# Patient Record
Sex: Male | Born: 1945
Health system: Southern US, Community
[De-identification: ages and names within clinical notes are randomized; demographics above are authoritative.]

## PROBLEM LIST (undated history)

## (undated) DIAGNOSIS — Z21 Asymptomatic human immunodeficiency virus [HIV] infection status: Secondary | ICD-10-CM

## (undated) DIAGNOSIS — E049 Nontoxic goiter, unspecified: Secondary | ICD-10-CM

## (undated) DIAGNOSIS — I639 Cerebral infarction, unspecified: Secondary | ICD-10-CM

## (undated) DIAGNOSIS — D849 Immunodeficiency, unspecified: Secondary | ICD-10-CM

## (undated) DIAGNOSIS — I10 Essential (primary) hypertension: Secondary | ICD-10-CM

## (undated) DIAGNOSIS — R011 Cardiac murmur, unspecified: Secondary | ICD-10-CM

## (undated) DIAGNOSIS — B20 Human immunodeficiency virus [HIV] disease: Secondary | ICD-10-CM

## (undated) DIAGNOSIS — I77810 Thoracic aortic ectasia: Secondary | ICD-10-CM

## (undated) HISTORY — DX: Cerebral infarction, unspecified: I63.9

## (undated) HISTORY — DX: Cardiac murmur, unspecified: R01.1

## (undated) HISTORY — PX: NO PAST SURGERIES: SHX2092

---

## 1998-02-01 ENCOUNTER — Encounter: Admission: RE | Admit: 1998-02-01 | Discharge: 1998-05-02 | Payer: Self-pay | Admitting: Internal Medicine

## 1999-04-21 ENCOUNTER — Emergency Department (HOSPITAL_COMMUNITY): Admission: EM | Admit: 1999-04-21 | Discharge: 1999-04-21 | Payer: Self-pay

## 2007-04-18 ENCOUNTER — Emergency Department (HOSPITAL_COMMUNITY): Admission: EM | Admit: 2007-04-18 | Discharge: 2007-04-18 | Payer: Self-pay | Admitting: Emergency Medicine

## 2007-04-25 ENCOUNTER — Emergency Department (HOSPITAL_COMMUNITY): Admission: EM | Admit: 2007-04-25 | Discharge: 2007-04-25 | Payer: Self-pay | Admitting: Emergency Medicine

## 2014-10-15 ENCOUNTER — Encounter (HOSPITAL_COMMUNITY): Payer: Self-pay

## 2014-10-15 ENCOUNTER — Inpatient Hospital Stay (HOSPITAL_COMMUNITY)
Admission: EM | Admit: 2014-10-15 | Discharge: 2014-10-16 | DRG: 305 | Disposition: A | Discharge status: Home / Self Care | Payer: Medicare Other | Attending: Internal Medicine | Admitting: Internal Medicine

## 2014-10-15 DIAGNOSIS — I10 Essential (primary) hypertension: Secondary | ICD-10-CM | POA: Diagnosis not present

## 2014-10-15 DIAGNOSIS — D61818 Other pancytopenia: Secondary | ICD-10-CM | POA: Diagnosis present

## 2014-10-15 DIAGNOSIS — R748 Abnormal levels of other serum enzymes: Secondary | ICD-10-CM | POA: Diagnosis present

## 2014-10-15 DIAGNOSIS — R040 Epistaxis: Secondary | ICD-10-CM | POA: Diagnosis not present

## 2014-10-15 DIAGNOSIS — I160 Hypertensive urgency: Secondary | ICD-10-CM

## 2014-10-15 LAB — CBC WITH DIFFERENTIAL/PLATELET
BASOS ABS: 0 10*3/uL (ref 0.0–0.1)
Basophils Relative: 0 % (ref 0–1)
Eosinophils Absolute: 0 10*3/uL (ref 0.0–0.7)
Eosinophils Relative: 0 % (ref 0–5)
HCT: 38.7 % — ABNORMAL LOW (ref 39.0–52.0)
HEMOGLOBIN: 12.9 g/dL — AB (ref 13.0–17.0)
LYMPHS PCT: 29 % (ref 12–46)
Lymphs Abs: 0.9 10*3/uL (ref 0.7–4.0)
MCH: 27.8 pg (ref 26.0–34.0)
MCHC: 33.3 g/dL (ref 30.0–36.0)
MCV: 83.4 fL (ref 78.0–100.0)
MONOS PCT: 7 % (ref 3–12)
Monocytes Absolute: 0.2 10*3/uL (ref 0.1–1.0)
NEUTROS ABS: 2 10*3/uL (ref 1.7–7.7)
NEUTROS PCT: 64 % (ref 43–77)
PLATELETS: 117 10*3/uL — AB (ref 150–400)
RBC: 4.64 MIL/uL (ref 4.22–5.81)
RDW: 14 % (ref 11.5–15.5)
WBC: 3.1 10*3/uL — ABNORMAL LOW (ref 4.0–10.5)

## 2014-10-15 LAB — COMPREHENSIVE METABOLIC PANEL
ALK PHOS: 125 U/L — AB (ref 39–117)
ALT: 12 U/L (ref 0–53)
ANION GAP: 8 (ref 5–15)
AST: 20 U/L (ref 0–37)
Albumin: 3.3 g/dL — ABNORMAL LOW (ref 3.5–5.2)
BUN: 17 mg/dL (ref 6–23)
CO2: 27 mmol/L (ref 19–32)
Calcium: 8.7 mg/dL (ref 8.4–10.5)
Chloride: 103 mmol/L (ref 96–112)
Creatinine, Ser: 0.92 mg/dL (ref 0.50–1.35)
GFR calc Af Amer: >90 mL/min (ref >90)
GFR calc non Af Amer: 84 mL/min — ABNORMAL LOW (ref >90)
Glucose, Bld: 113 mg/dL — ABNORMAL HIGH (ref 70–99)
Potassium: 3.8 mmol/L (ref 3.5–5.1)
Sodium: 138 mmol/L (ref 135–145)
Total Bilirubin: 0.7 mg/dL (ref 0.3–1.2)
Total Protein: 7.9 g/dL (ref 6.0–8.3)

## 2014-10-15 LAB — TROPONIN I: Troponin I: <0.03 ng/mL (ref <0.031)

## 2014-10-15 MED ORDER — LABETALOL HCL 5 MG/ML IV SOLN: 5.0000 mg | INTRAVENOUS | Status: DC | PRN

## 2014-10-15 MED ORDER — AMLODIPINE BESYLATE 5 MG PO TABS
5.0000 mg | ORAL_TABLET | Freq: Once | ORAL | Status: AC
  Administered 2014-10-15: 5 mg via ORAL
  Filled 2014-10-15: qty 1

## 2014-10-15 MED ORDER — METOPROLOL TARTRATE 1 MG/ML IV SOLN
5.0000 mg | INTRAVENOUS | Status: DC | PRN
  Administered 2014-10-15 (×2): 5 mg via INTRAVENOUS
  Filled 2014-10-15 (×2): qty 5

## 2014-10-15 MED ORDER — HYDRALAZINE HCL 20 MG/ML IJ SOLN
5.0000 mg | INTRAMUSCULAR | Status: DC | PRN
  Administered 2014-10-16: 5 mg via INTRAVENOUS
  Filled 2014-10-15: qty 1

## 2014-10-15 MED ORDER — SILVER NITRATE-POT NITRATE 75-25 % EX MISC
1.0000 | Freq: Once | CUTANEOUS | Status: AC
  Administered 2014-10-15: 1 via TOPICAL
  Filled 2014-10-15: qty 1

## 2014-10-15 MED ORDER — NICARDIPINE HCL IN NACL 20-0.86 MG/200ML-% IV SOLN
3.0000 mg/h | INTRAVENOUS | Status: DC
  Administered 2014-10-15: 5 mg/h via INTRAVENOUS
  Filled 2014-10-15: qty 200

## 2014-10-15 MED ORDER — LABETALOL HCL 5 MG/ML IV SOLN
10.0000 mg | Freq: Once | INTRAVENOUS | Status: AC
  Administered 2014-10-15: 10 mg via INTRAVENOUS
  Filled 2014-10-15: qty 4

## 2014-10-15 MED ORDER — ACETAMINOPHEN 325 MG PO TABS
650.0000 mg | ORAL_TABLET | Freq: Once | ORAL | Status: AC
  Administered 2014-10-15: 650 mg via ORAL
  Filled 2014-10-15: qty 2

## 2014-10-15 MED ORDER — NITROGLYCERIN IN D5W 200-5 MCG/ML-% IV SOLN
0.0000 ug/min | INTRAVENOUS | Status: DC
  Administered 2014-10-15: 5 ug/min via INTRAVENOUS
  Filled 2014-10-15: qty 250

## 2014-10-15 MED ORDER — LISINOPRIL 5 MG PO TABS
5.0000 mg | ORAL_TABLET | Freq: Every day | ORAL | Status: DC
  Administered 2014-10-16 (×2): 5 mg via ORAL
  Filled 2014-10-15 (×2): qty 1

## 2014-10-15 MED ORDER — AMLODIPINE BESYLATE 5 MG PO TABS: 5.0000 mg | ORAL_TABLET | Freq: Every day | ORAL | Status: DC

## 2014-10-15 NOTE — ED Notes (Signed)
Dr. Rubin PayorPickering aware of pt blood pressure.

## 2014-10-15 NOTE — ED Notes (Signed)
Dr. Rubin PayorPickering placed rapid rhino in patients right nostril.

## 2014-10-15 NOTE — ED Notes (Signed)
Per [facility]: pt. Is from home. Sudden onset of epistaxis this AM. Approx. 1.5 hours at this time. Continuously bleeding, pt. Has spit up some blood clots. Pt. Is hypertensive at 220/120. Pt. Denies pain at this time.

## 2014-10-15 NOTE — ED Provider Notes (Signed)
CSN: [id number]     Arrival date & time 10/15/14  9528 History   First MD Initiated Contact with Patient 10/15/14 0845     Chief Complaint  Patient presents with  . Epistaxis     (Consider location/radiation/quality/duration/timing/severity/associated sxs/prior Treatment) Patient is a 69 y.o. male presenting with nosebleeds. The history is provided by the patient.  Epistaxis Location:  Bilateral Associated symptoms: no fever    patient with blood going out both of his nostrils that began earlier today. Also some going down the back was thorough. No other bleeding. No chest pain. No fevers. No trauma. No history of same. He does not see a doctor. He denies alcohol intake.  History reviewed. No pertinent past medical history. History reviewed. No pertinent past surgical history. No family history on file. History  Substance Use Topics  . Smoking status: Never Smoker   . Smokeless tobacco: Not on file  . Alcohol Use: No    Review of Systems  Constitutional: Negative for fever, chills and diaphoresis.  HENT: Positive for nosebleeds.   Respiratory: Negative for shortness of breath.   Cardiovascular: Negative for chest pain.  Gastrointestinal: Negative for abdominal pain.  Musculoskeletal: Negative for joint swelling.  Skin: Negative for wound.  Hematological: Does not bruise/bleed easily.      Allergies  Review of patient's allergies indicates no known allergies.  Home Medications   Prior to Admission medications   Medication Sig Start Date End Date Taking? Authorizing Provider  amLODipine (NORVASC) 5 MG tablet Take 1 tablet (5 mg total) by mouth daily. 10/15/14   Juliet Rude. Cilicia Borden, MD   BP 183/104 mmHg  Pulse 75  Temp(Src) 98.2 F (36.8 C) (Oral)  Resp 18  SpO2 100% Physical Exam  Constitutional: He appears well-developed.  HENT:  Head: Normocephalic.  Some bleeding out of both nares. Slight bleeding in posterior pharynx.  Cardiovascular: Normal rate and  regular rhythm.   Pulmonary/Chest: Effort normal.  Abdominal: Soft.  Neurological: He is alert.  Skin: Skin is warm.    ED Course  EPISTAXIS MANAGEMENT Date/Time: 10/15/2014 9:45 AM Performed by: Benjiman Core R. Authorized by: Billee Cashing Consent: Verbal consent obtained. Written consent not obtained. Risks and benefits: risks, benefits and alternatives were discussed Consent given by: patient Patient understanding: patient states understanding of the procedure being performed Required items: required blood products, implants, devices, and special equipment available Patient identity confirmed: verbally with patient Time out: Immediately prior to procedure a "time out" was called to verify the correct patient, procedure, equipment, support staff and site/side marked as required. Local anesthetic: topical anesthetic (Phenylephrine and 4% lidocaine mixed and on a cotton ball in each naris) Patient sedated: no Treatment site: left posterior Repair method: nasal balloon Post-procedure assessment: bleeding stopped Treatment complexity: simple Recurrence: recurrence of recent bleed Patient tolerance: Patient tolerated the procedure well with no immediate complications Comments: Patient initially had possible bleeding site visualized in right nerve anteriorly. Silver nitrate applied. And begin to have recurrence of bleeding out of both nostrils but appeared be worse on the left. 7.5 cm rapid Rhino a placed with control of the bleeding.  Patient had recurrence of bleeding. 5.5 cm balloon placed in the right nerve. He then had later continued bleeding out of the left nerve. 7.5 cm balloon was removed and a 9 cm anterior posterior balloon was placed with improvement of the bleeding.   (including critical care time) Labs Review Labs Reviewed  CBC WITH DIFFERENTIAL/PLATELET - Abnormal; Notable for the  following:    WBC 3.1 (*)    Hemoglobin 12.9 (*)    HCT 38.7 (*)    Platelets  117 (*)    All other components within normal limits  COMPREHENSIVE METABOLIC PANEL - Abnormal; Notable for the following:    Glucose, Bld 113 (*)    Albumin 3.3 (*)    Alkaline Phosphatase 125 (*)    GFR calc non Af Amer 84 (*)    All other components within normal limits  TROPONIN I    Imaging Review No results found.   EKG Interpretation   Date/Time:  Saturday October 15 2014 13:22:19 EST Ventricular Rate:  70 PR Interval:  152 QRS Duration: 97 QT Interval:  502 QTC Calculation: 542 R Axis:   -74 Text Interpretation:  Sinus rhythm Probable left atrial enlargement Left  anterior fascicular block Left ventricular hypertrophy Anterior ST  elevation, probably due to LVH Prolonged QT interval Confirmed by  Rubin PayorPICKERING  MD, Harrold DonathNATHAN [phone]) on 10/15/2014 1:29:49 PM      MDM   Final diagnoses:  Left-sided epistaxis  Essential hypertension   Patient with nosebleed. His been somewhat difficult to control required packing 3 different times. Did eventually get apparent hemostasis. Hypertension without history. He has not however had much medical examination in the past. He is given some medicine to lower down the blood pressure. With the difficulty in the bleeding and the persistent hypertension will admit to internal medicine. Teaching service will see the patient.       Juliet RudeNathan R. Rubin PayorPickering, MD 10/15/14 [phone]

## 2014-10-15 NOTE — ED Notes (Signed)
Per Critical Care, pt BP too low. Told to turn off Cardene.

## 2014-10-15 NOTE — ED Notes (Signed)
While patient was getting ready, nose started bleeding again. Pt given gauze to control bleeding, MD notified. Pt placed back in gown.

## 2014-10-15 NOTE — ED Notes (Signed)
Spoke with internal medicine, will see patient shortly.

## 2014-10-15 NOTE — H&P (Signed)
Date: 10/15/2014               Patient Name:  Thomas NordmannWilliam Haye MRN: [id number]  DOB: 07/08/1946 Age / Sex: 69 y.o., male   PCP: No primary care provider on file.         Medical Service: Internal Medicine Teaching Service         Attending Physician: Dr. Burns SpainElizabeth A Butcher, MD    First Contact: Dr. Farley LyAdam Rothman  Pager: [phone]   Second Contact: Dr. Evelena PeatAlex Wilson  Pager: [phone]        After Hours (After 5p/  First Contact Pager: [phone]  weekends / holidays): Second Contact Pager: [phone]   Chief Complaint: Epistaxis  History of Present Illness: [name] is a 69 year old male with no known past medical history who presents today to the ED with epistaxis.  This morning, he reports sitting in his truck when he first noted the onset of epistaxis. He reports the bleeding was substantial and compares it to water running out of a faucet. Afterwards, he went to the bathroom to clean himself up at the place of his employment when one of his coworkers called 911. He was then brought to the emergency department for further evaluation was found to have elevated blood pressures, highest of which was 207/111. He denies any recent trauma, NSAID use, changes in his diet, family history of bleeding disorders, prior episodes of epistaxis, or other associated symptoms like headache, chest pain, shortness of breath, changes in vision, hematochezia, melena, hematuria. He has not seen a doctor in several years though when he last saw one regularly, he was told he had high blood pressure but never required any medication. Only prior hospital encounter he reports his when he had to get stitches for his left second finger after he sustained an injury as a consequence of operating machinery at his place of employment, [employer]. 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 He denies any history or recent use of tobacco, alcohol, illicit  drugs.  In the ED, he was given Norvasc 5 mg as well as labetalol 10 mg IV.  Meds: No current facility-administered medications for this encounter.   Current Outpatient Prescriptions  Medication Sig Dispense Refill  . amLODipine (NORVASC) 5 MG tablet Take 1 tablet (5 mg total) by mouth daily. 30 tablet 0    Allergies: Allergies as of 10/15/2014  . (No Known Allergies)   History reviewed. No pertinent past medical history. History reviewed. No pertinent past surgical history. No family history on file. History   Social History  . Marital Status: Single    Spouse Name: N/A  . Number of Children: N/A  . Years of Education: N/A   Occupational History  . Not on file.   Social History Main Topics  . Smoking status: Never Smoker   . Smokeless tobacco: Not on file  . Alcohol Use: No  . Drug Use: No  . Sexual Activity: Not on file   Other Topics Concern  . Not on file   Social History Narrative  . No narrative on file    Review of Systems: Review of Systems  Constitutional: Negative for fever.  HENT: Positive for nosebleeds.   Eyes: Negative for blurred vision.  Respiratory: Negative for cough, hemoptysis and shortness of breath.   Cardiovascular: Negative for chest pain.  Gastrointestinal: Negative for nausea, vomiting, abdominal pain, diarrhea and melena.  Genitourinary: Negative  for hematuria.  Neurological: Negative for dizziness and headaches.  Endo/Heme/Allergies: Does not bruise/bleed easily.  Psychiatric/Behavioral: Negative for substance abuse.     Physical Exam: Blood pressure 179/97, pulse 78, temperature 98.2 F (36.8 C), temperature source Oral, resp. rate 20, SpO2 100 %. General: resting in bed, NAD, thin appearing HEENT: PERRL, EOMI, no scleral icterus, oropharynx clear, nasal packing present in both nares Cardiac: RRR, systolic ejection murmur best appreciated in the the right upper sternal border Pulm: clear to auscultation bilaterally, no  wheezes, rales, or rhonchi Abd: soft, nontender, nondistended, BS present, no CVA tenderness Ext: No pretibial edema Neuro: CN II-XII intact, 5/5 upper and lower extremity strength, 2+ grip strength   Lab results: Basic Metabolic Panel:  Recent Labs  96/04/54 1324  NA 138  K 3.8  CL 103  CO2 27  GLUCOSE 113*  BUN 17  CREATININE 0.92  CALCIUM 8.7   Liver Function Tests:  Recent Labs  10/15/14 1324  AST 20  ALT 12  ALKPHOS 125*  BILITOT 0.7  PROT 7.9  ALBUMIN 3.3*   CBC:  Recent Labs  10/15/14 1324  WBC 3.1*  NEUTROABS 2.0  HGB 12.9*  HCT 38.7*  MCV 83.4  PLT 117*   Cardiac Enzymes:  Recent Labs  10/15/14 1324  TROPONINI <0.03    Other results: EKG: Reviewed and compared with none prior. Normal sinus rhythm Left axis deviation  Assessment & Plan by Problem:  Hypertensive urgency: Absence of neurologic deficits, right upper quadrant abdominal pain, chest pain, shortness of breath is otherwise reassuring. Unsure of what his prior baseline is as he has no known past medical history other than what he has reported on interview. -Give additional amlodipine   -Start lisinopril  and start nicardipine gtt her goal systolic blood pressure 160-170. -Consider HCTZ 25 mg and up titrate tomorrow -Continue Lopressor 5 mg IV & hydralazine  IV when necessary for BP greater than 180/110 -Start Zofran 4 mg every 6 hours as needed for nausea -Check CMET tomorrow morning  Epistaxis: Likely acute, possibly in the setting of cold weather. No prior history of a bleeding disorder or family history is otherwise reassuring. Platelets on admission were found to be 117, which is mildly lower than the reference range for normal. Stable at the time of interview. -Continue monitoring for active bleeding -Check CBC tomorrow morning  Elevated alkaline phosphatase: 125 on admission. Physiologic causes include postprandial state, age greater than 42, male sex.  Hepatobiliary disease less likely given that other LFTs are otherwise unremarkable though GGT would be the next step in working this up as an elevated value would be expected. If negative, bone disorders would be suspect.  -CMET as noted above  #FEN:  -Diet: Heart Healthy  #DVT prophylaxis: heparin 5000 units subcutaneous  #CODE STATUS: FULL CODE -Confirmed with patient on admission  Dispo: Disposition is deferred at this time, awaiting improvement of current medical problems.   The patient does not have a current PCP (No primary care provider on file.) and does not know need an [facility] hospital follow-up appointment after discharge.  The patient does not know have transportation limitations that hinder transportation to clinic appointments.  Signed: Heywood Iles, MD 10/15/2014, 7:32 PM

## 2014-10-15 NOTE — ED Notes (Signed)
Dr. Burtis Junessadek [phone].

## 2014-10-15 NOTE — ED Notes (Signed)
Pt nose still bleeding. MD notified. See orders.

## 2014-10-15 NOTE — ED Notes (Signed)
Internal medicine contact, will come see patient shortly.

## 2014-10-16 DIAGNOSIS — I10 Essential (primary) hypertension: Principal | ICD-10-CM

## 2014-10-16 LAB — COMPREHENSIVE METABOLIC PANEL
ALBUMIN: 3.3 g/dL — AB (ref 3.5–5.2)
ALK PHOS: 117 U/L (ref 39–117)
ALT: 11 U/L (ref 0–53)
ANION GAP: 4 — AB (ref 5–15)
AST: 19 U/L (ref 0–37)
BUN: 12 mg/dL (ref 6–23)
CHLORIDE: 103 mmol/L (ref 96–112)
CO2: 31 mmol/L (ref 19–32)
Calcium: 8.8 mg/dL (ref 8.4–10.5)
Creatinine, Ser: 0.91 mg/dL (ref 0.50–1.35)
GFR calc Af Amer: >90 mL/min (ref >90)
GFR, EST NON AFRICAN AMERICAN: 84 mL/min — AB (ref >90)
GLUCOSE: 109 mg/dL — AB (ref 70–99)
Potassium: 3.5 mmol/L (ref 3.5–5.1)
SODIUM: 138 mmol/L (ref 135–145)
TOTAL PROTEIN: 7.8 g/dL (ref 6.0–8.3)
Total Bilirubin: 0.8 mg/dL (ref 0.3–1.2)

## 2014-10-16 LAB — CBC
HEMATOCRIT: 38.3 % — AB (ref 39.0–52.0)
Hemoglobin: 12.5 g/dL — ABNORMAL LOW (ref 13.0–17.0)
MCH: 27.2 pg (ref 26.0–34.0)
MCHC: 32.6 g/dL (ref 30.0–36.0)
MCV: 83.3 fL (ref 78.0–100.0)
PLATELETS: 115 10*3/uL — AB (ref 150–400)
RBC: 4.6 MIL/uL (ref 4.22–5.81)
RDW: 13.9 % (ref 11.5–15.5)
WBC: 3.7 10*3/uL — AB (ref 4.0–10.5)

## 2014-10-16 MED ORDER — HEPARIN SODIUM (PORCINE) 5000 UNIT/ML IJ SOLN: 5000.0000 [IU] | Freq: Three times a day (TID) | INTRAMUSCULAR | Status: DC

## 2014-10-16 MED ORDER — ONDANSETRON HCL 4 MG/2ML IJ SOLN: 4.0000 mg | Freq: Four times a day (QID) | INTRAMUSCULAR | Status: DC | PRN

## 2014-10-16 MED ORDER — HYDROCHLOROTHIAZIDE 25 MG PO TABS
25.0000 mg | ORAL_TABLET | Freq: Every day | ORAL | Status: DC
  Administered 2014-10-16: 25 mg via ORAL
  Filled 2014-10-16: qty 1

## 2014-10-16 MED ORDER — LISINOPRIL-HYDROCHLOROTHIAZIDE 10-12.5 MG PO TABS: 1.0000 | ORAL_TABLET | Freq: Every day | ORAL | Status: DC

## 2014-10-16 MED ORDER — ONDANSETRON HCL 4 MG PO TABS: 4.0000 mg | ORAL_TABLET | Freq: Four times a day (QID) | ORAL | Status: DC | PRN

## 2014-10-16 MED ORDER — ACETAMINOPHEN 325 MG PO TABS
650.0000 mg | ORAL_TABLET | Freq: Four times a day (QID) | ORAL | Status: DC | PRN
  Administered 2014-10-16: 650 mg via ORAL
  Filled 2014-10-16: qty 2

## 2014-10-16 NOTE — ED Notes (Signed)
Patient is resting comfortably. 

## 2014-10-16 NOTE — Progress Notes (Signed)
Subjective: Thomas [name]. He has not had any nose bleeds since packed in the ED. He has not noticed blood in his mouth. He says that his headache has improved and denies any vision changes, weakness, numbness, paresethesias. We explained that he needs a PCP to monitor his HTN and follow-up on labs and educated him on the dangers of long-standing untreated HTN. We removed nasal packing b/l with minimal dry blood noted on packing.  Objective: Vital signs in last 24 hours: Filed Vitals:   10/16/14 0402 10/16/14 0435 10/16/14 0535 10/16/14 0730  BP: 179/98 149/83 144/78 168/95  Pulse: 73 64 63 69  Temp: 98.2 F (36.8 C)   98.4 F (36.9 C)  TempSrc: Oral   Oral  Resp: 20 16 18 21   Height: 5\' 9"  (1.753 m)     Weight: 144 lb 9.6 oz (65.59 kg)     SpO2: 96% 99% 99% 97%   Weight change:   Intake/Output Summary (Last 24 hours) at 10/16/14 1018 Last data filed at 10/16/14 0400  Gross per 24 hour  Intake    120 ml  Output      0 ml  Net    120 ml   Gen: A&O x 4, no acute distress, well developed, well nourished HEENT: Atraumatic, PERRL, EOMI, sclerae anicteric, moist mucous membranes Heart: Regular rate and rhythm, normal S1 S2, no murmurs, rubs, or gallops Lungs: Clear to auscultation bilaterally, respirations unlabored Abd: Soft, non-tender, non-distended, + bowel sounds, no hepatosplenomegaly Ext: No edema or cyanosis   Lab Results: Basic Metabolic Panel:  Recent Labs Lab 10/15/14 1324 10/16/14 0345  NA 138 138  K 3.8 3.5  CL 103 103  CO2 27 31  GLUCOSE 113* 109*  BUN 17 12  CREATININE 0.92 0.91  CALCIUM 8.7 8.8   Liver Function Tests:  Recent Labs Lab 10/15/14 1324 10/16/14 0345  AST 20 19  ALT 12 11  ALKPHOS 125* 117  BILITOT 0.7 0.8  PROT 7.9 7.8  ALBUMIN 3.3* 3.3*   CBC:  Recent Labs Lab 10/15/14 1324 10/16/14 0345  WBC 3.1* 3.7*  NEUTROABS 2.0  --   HGB 12.9* 12.5*  HCT 38.7* 38.3*  MCV 83.4 83.3  PLT 117* 115*   Cardiac Enzymes:  Recent  Labs Lab 10/15/14 1324  TROPONINI <0.03   Micro Results: No results found for this or any previous visit (from the past 240 hour(s)). Studies/Results: No results found. Medications: I have reviewed the patient's current medications. Scheduled Meds: . heparin  5,000 Units Subcutaneous 3 times per day  . hydrochlorothiazide  25 mg Oral Daily  . lisinopril  5 mg Oral Daily   Continuous Infusions:  PRN Meds:.acetaminophen, hydrALAZINE, labetalol, ondansetron **OR** ondansetron (ZOFRAN) IV Assessment/Plan: Principal Problem:   Hypertensive urgency Active Problems:   Epistaxis  #Hypertensive urgency: BP now well controlled. This morning it is the 140s-170s/70s-90s. As he has longstanding HTN, would not want to drop BP much further. Mr Pricilla Holmucker presented with blood pressure up to 207/111. He initially received labetalol 10 mg iv in the ED. He was started on amlodipine 5 mg in the E then received an additional 5 mg of amlodipine yesterday evening. He also was briefly started on a cardene drip which was discontinued after PCCM consult who felt his BP was too low to need drip. He then received lisinopril 10 mg and HCTZ 25 mg. On rounds Mr Pricilla Holmucker was asymptomatic other than headache which has largely resolved with tylenol. The decision was made to  discharge on lisinopril-HCTZ combo 10-12.5 combination pill. He will need follow-up in [facility] to monitor his blood pressure, electrolytes, and titrate as needed. He was educated on the importance of BP control and is agreeable with plan. -d/c on lisinopril-HCTZ 10-12.5 mg if no severe epistaxis -refer for PCP f/u in [facility]  #Epistaxis: Likely acute, possibly in the setting of cold weather. No prior history of a bleeding disorder or family history is otherwise reassuring. Platelets on admission were found to be 117, which is mildly lower than the reference range for normal. His packing was removed during morning rounds with no active bleeding.  Hemoglobin stable at 12.5 this morning from 12.9 on presentation. We will monitor for further epistaxis this morning and if stable, then he may be discharged. Mr Rodger given clear return precautions for further bleeding. Stable at the time of interview. -Continue monitoring for active bleeding -Check CBC tomorrow morning  #Pancytopenia: Presenting WBC 3.1, hemoglobin 12.9, and platelet 117 is stable this morning at 3.7, 12.5, and 115, respectively. No prior in our system. -repeat CBC by PCP -PCP consider HIV test  #Elevated alkaline phosphatase: 125 on admission but down to 117 this morning. He has no abdominal complaints and exam benign. Physiologic causes include postprandial state, age greater than 57, male sex. Hepatobiliary disease less likely given that other LFTs are otherwise unremarkable though GGT would be the next step in working this up as an elevated value would be expected. If negative, bone disorders would be suspect. Will put in discharge paperwork so PCP referral can follow-up -repeat CMP by PCP to monitor  #Dispo: today if no severe epistaxis or hypertensive emergency  The patient does not have a current PCP (No primary care provider on file.) and does need an [facility] hospital follow-up appointment after discharge.  The patient does have transportation limitations that hinder transportation to clinic appointments.  .Services Needed at time of discharge: Y = Yes, Blank = No PT:   OT:   RN:   Equipment:   Other:     LOS: 1 day   Lorenda Hatchet, MD 10/16/2014, 10:18 AM

## 2014-10-16 NOTE — Discharge Instructions (Signed)
It was a pleasure to care for you at [facility]. We have started a new medication called lisinopril-HCTZ to be taken once daily for your high blood pressure. Please call the [facility] clinic we discussed at [phone] tomorrow to schedule an appointment. You may choose another clinic to attend but just please go to a doctor to establish care. Please return to the Emergency Department or seek medical attention if you have any new or worsening chest pain, shortness of breath, severe nose bleed, or other worrisome medical condition.   Farley LyAdam Donie Moulton, MD  Hypertension Hypertension, commonly called high blood pressure, is when the force of blood pumping through your arteries is too strong. Your arteries are the blood vessels that carry blood from your heart throughout your body. A blood pressure reading consists of a higher number over a lower number, such as 110/72. The higher number (systolic) is the pressure inside your arteries when your heart pumps. The lower number (diastolic) is the pressure inside your arteries when your heart relaxes. Ideally you want your blood pressure below 120/80. Hypertension forces your heart to work harder to pump blood. Your arteries may become narrow or stiff. Having hypertension puts you at risk for heart disease, stroke, and other problems.  RISK FACTORS Some risk factors for high blood pressure are controllable. Others are not.  Risk factors you cannot control include:   Race. You may be at higher risk if you are African American.  Age. Risk increases with age.  Gender. Men are at higher risk than women before age 70 years. After age 70, women are at higher risk than men. Risk factors you can control include:  Not getting enough exercise or physical activity.  Being overweight.  Getting too much fat, sugar, calories, or salt in your diet.  Drinking too much alcohol. SIGNS AND SYMPTOMS Hypertension does not usually cause signs or symptoms.  Extremely high blood pressure (hypertensive crisis) may cause headache, anxiety, shortness of breath, and nosebleed. DIAGNOSIS  To check if you have hypertension, your health care provider will measure your blood pressure while you are seated, with your arm held at the level of your heart. It should be measured at least twice using the same arm. Certain conditions can cause a difference in blood pressure between your right and left arms. A blood pressure reading that is higher than normal on one occasion does not mean that you need treatment. If one blood pressure reading is high, ask your health care provider about having it checked again. TREATMENT  Treating high blood pressure includes making lifestyle changes and possibly taking medicine. Living a healthy lifestyle can help lower high blood pressure. You may need to change some of your habits. Lifestyle changes may include:  Following the DASH diet. This diet is high in fruits, vegetables, and whole grains. It is low in salt, red meat, and added sugars.  Getting at least 2 hours of brisk physical activity every week.  Losing weight if necessary.  Not smoking.  Limiting alcoholic beverages.  Learning ways to reduce stress. If lifestyle changes are not enough to get your blood pressure under control, your health care provider may prescribe medicine. You may need to take more than one. Work closely with your health care provider to understand the risks and benefits. HOME CARE INSTRUCTIONS  Have your blood pressure rechecked as directed by your health care provider.   Take medicines only as directed by your health care provider. Follow the directions carefully.  Blood pressure medicines must be taken as prescribed. The medicine does not work as well when you skip doses. Skipping doses also puts you at risk for problems.   Do not smoke.   Monitor your blood pressure at home as directed by your health care provider. SEEK MEDICAL CARE  IF:   You think you are having a reaction to medicines taken.  You have recurrent headaches or feel dizzy.  You have swelling in your ankles.  You have trouble with your vision. SEEK IMMEDIATE MEDICAL CARE IF:  You develop a severe headache or confusion.  You have unusual weakness, numbness, or feel faint.  You have severe chest or abdominal pain.  You vomit repeatedly.  You have trouble breathing. MAKE SURE YOU:   Understand these instructions.  Will watch your condition.  Will get help right away if you are not doing well or get worse. Document Released: 08/19/2005 Document Revised: 01/03/2014 Document Reviewed: 06/11/2013 [facility] Patient Information 2015 [place], [place]. This information is not intended to replace advice given to you by your health care provider. Make sure you discuss any questions you have with your health care provider.  Nosebleed Nosebleeds can be caused by many conditions, including trauma, infections, polyps, foreign bodies, dry mucous membranes or climate, medicines, and air conditioning. Most nosebleeds occur in the front of the nose. Because of this location, most nosebleeds can be controlled by pinching the nostrils gently and continuously for at least 10 to 20 minutes. The long, continuous pressure allows enough time for the blood to clot. If pressure is released during that 10 to 20 minute time period, the process may have to be started again. The nosebleed may stop by itself or quit with pressure, or it may need concentrated heating (cautery) or pressure from packing. HOME CARE INSTRUCTIONS   If your nose was packed, try to maintain the pack inside until your health care provider removes it. If a gauze pack was used and it starts to fall out, gently replace it or cut the end off. Do not cut if a balloon catheter was used to pack the nose. Otherwise, do not remove unless instructed.  Avoid blowing your nose for 12 hours after treatment. This could  dislodge the pack or clot and start the bleeding again.  If the bleeding starts again, sit up and bend forward, gently pinching the front half of your nose continuously for 20 minutes.  If bleeding was caused by dry mucous membranes, use over-the-counter saline nasal spray or gel. This will keep the mucous membranes moist and allow them to heal. If you must use a lubricant, choose the water-soluble variety. Use it only sparingly and not within several hours of lying down.  Do not use petroleum jelly or mineral oil, as these may drip into the lungs and cause serious problems.  Maintain humidity in your home by using less air conditioning or by using a humidifier.  Do not use aspirin or medicines which make bleeding more likely. Your health care provider can give you recommendations on this.  Resume normal activities as you are able, but try to avoid straining, lifting, or bending at the waist for several days.  If the nosebleeds become recurrent and the cause is unknown, your health care provider may suggest laboratory tests. SEEK MEDICAL CARE IF: You have a fever. SEEK IMMEDIATE MEDICAL CARE IF:   Bleeding recurs and cannot be controlled.  There is unusual bleeding from or bruising on other parts of the body.  Nosebleeds continue.  There is any worsening of the condition which originally brought you in.  You become light-headed, feel faint, become sweaty, or vomit blood. MAKE SURE YOU:   Understand these instructions.  Will watch your condition.  Will get help right away if you are not doing well or get worse. Document Released: 05/29/2005 Document Revised: 01/03/2014 Document Reviewed: 07/20/2009 [facility] Patient Information 2015 [place], [place]. This information is not intended to replace advice given to you by your health care provider. Make sure you discuss any questions you have with your health care provider.

## 2014-10-16 NOTE — Progress Notes (Signed)
Utilization Review Completed.Thomas [name] T2/14/2016  

## 2014-10-16 NOTE — Discharge Summary (Signed)
Name: Thomas [name] MRN: [id number] DOB: 06/22/1946 69 y.o. PCP: No primary care provider on file.  Date of Admission: 10/15/2014  8:35 AM Date of Discharge: 10/16/2014 Attending Physician: Burns SpainElizabeth A Butcher, MD  Discharge Diagnosis:  Principal Problem:   Hypertensive urgency Active Problems:   Epistaxis  Discharge Medications:   Medication List    TAKE these medications        lisinopril-hydrochlorothiazide 10-12.5 MG per tablet  Commonly known as:  PRINZIDE,ZESTORETIC  Take 1 tablet by mouth daily.        Disposition and follow-up:   Thomas [name] was discharged from [facility] in Good condition.  At the hospital follow up visit please address:  1.  Any recurrent nose bleeds, BP control, pancytopenia  2.  Labs / imaging needed at time of follow-up: CBC (pancytopenia), CMP (electrolytes, alkaline phosphatase), consider HIV  3.  Pending labs/ test needing follow-up: none  Follow-up Appointments: Follow-up Information    Follow up with [facility]. Call in 1 day.   Contact information:   [phone]  [email or web address]     Admission HPI: Thomas. Pricilla [name] is a 69 year old male with no known past medical history who presents today to the ED with epistaxis.  This morning, he reports sitting in his truck when he first noted the onset of epistaxis. He reports the bleeding was substantial and compares it to water running out of a faucet. Afterwards, he went to the bathroom to clean himself up at the place of his employment when one of his coworkers called 911. He was then brought to the emergency department for further evaluation was found to have elevated blood pressures, highest of which was 207/111. He denies any recent trauma, NSAID use, changes in his diet, family history of bleeding disorders, prior episodes of epistaxis, or other associated symptoms like headache, chest pain,  shortness of breath, changes in vision, hematochezia, melena, hematuria. He has not seen a doctor in several years though when he last saw one regularly, he was told he had high blood pressure but never required any medication. Only prior hospital encounter he reports his when he had to get stitches for his left second finger after he sustained an injury as a consequence of operating machinery at his place of employment, [employer]. 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 He denies any history or recent use of tobacco, alcohol, illicit drugs.  In the ED, he was given Norvasc 5 mg as well as labetalol 10 mg IV.  Hospital Course by problem list: Principal Problem:   Hypertensive urgency Active Problems:   Epistaxis   #Hypertensive urgency: Thomas [name]'s BP is now well controlled. The morning of discharge it was in the 140s-170s/70s-90s. As he has longstanding HTN, would not want to drop BP much further. Thomas [name] presented with blood pressure up to 207/111. He initially received labetalol 10 mg iv in the ED. He was started on amlodipine 5 mg in the ED then received an additional 5 mg of amlodipine yesterday evening. He also was briefly started on a cardene drip which was discontinued after PCCM consult who felt his BP was too low to need drip. He then received lisinopril 10 mg and HCTZ 25 mg. On rounds Thomas [name] was asymptomatic other than headache which has largely resolved with tylenol. The decision was made to discharge on lisinopril-HCTZ combo 10-12.5 combination pill. He will need follow-up in  [place] to monitor his blood pressure, electrolytes, and titrate as needed. He was educated on the importance of BP control and is agreeable with plan.  #Epistaxis: Likely acute, possibly in the setting of cold weather. No prior history of a bleeding disorder or family history is otherwise reassuring. Platelets on admission  were found to be 117, which is mildly lower than the reference range for normal. His packing was removed during morning rounds with no active bleeding. Hemoglobin stable at 12.5 the morning of discharge from 12.9 on presentation. We will monitor for further epistaxis this morning and if stable, then he may be discharged. Thomas [name] given clear return precautions for further bleeding. Stable at discharge with no further bleeding.  #Pancytopenia: Presenting WBC 3.1, hemoglobin 12.9, and platelet 117 is stable this morning at 3.7, 12.5, and 115, respectively. No prior in our system. Once he is established with care, he should have repeat with possible work-up including HIV test and/or hematology referral if warranted.  #Elevated alkaline phosphatase: 125 on admission but down to 117 prior to discharge. He has no abdominal complaints and exam benign. Physiologic causes include postprandial state, age greater than 25, male sex. Hepatobiliary disease less likely given that other LFTs are otherwise unremarkable though GGT would be the next step in working this up as an elevated value would be expected. If negative, bone disorders would be suspect. PCP should reassess once established.   Discharge Vitals:   BP 168/95 mmHg  Pulse 69  Temp(Src) 98.4 F (36.9 C) (Oral)  Resp 21  Ht  (1.753 m)  Wt 144 lb 9.6 oz (65.59 kg)  BMI 21.34 kg/m2  SpO2 97%  Discharge Physical Exam: Gen: A&O x 4, no acute distress, well developed, well nourished HEENT: Atraumatic, PERRL, EOMI, sclerae anicteric, moist mucous membranes Heart: Regular rate and rhythm, normal S1 S2, no murmurs, rubs, or gallops Lungs: Clear to auscultation bilaterally, respirations unlabored Abd: Soft, non-tender, non-distended, + bowel sounds, no hepatosplenomegaly Ext: No edema or cyanosis  Discharge Labs:  Basic Metabolic Panel:  Recent Labs  44/01/02 1324 10/16/14 0345  NA 138 138  K 3.8 3.5  CL 103 103  CO2 27 31  GLUCOSE 113*  109*  BUN 17 12  CREATININE 0.92 0.91  CALCIUM 8.7 8.8   Liver Function Tests:  Recent Labs  10/15/14 1324 10/16/14 0345  AST 20 19  ALT 12 11  ALKPHOS 125* 117  BILITOT 0.7 0.8  PROT 7.9 7.8  ALBUMIN 3.3* 3.3*   CBC:  Recent Labs  10/15/14 1324 10/16/14 0345  WBC 3.1* 3.7*  NEUTROABS 2.0  --   HGB 12.9* 12.5*  HCT 38.7* 38.3*  MCV 83.4 83.3  PLT 117* 115*   Cardiac Enzymes:  Recent Labs  10/15/14 1324  TROPONINI <0.03    Signed: Lorenda Hatchet, MD 10/16/2014, 1:11 PM    Services Ordered on Discharge: none Equipment Ordered on Discharge: none

## 2014-10-17 ENCOUNTER — Other Ambulatory Visit: Payer: Self-pay | Admitting: Internal Medicine

## 2015-04-20 ENCOUNTER — Observation Stay (HOSPITAL_COMMUNITY): Payer: Medicare Other

## 2015-04-20 ENCOUNTER — Emergency Department (HOSPITAL_COMMUNITY): Payer: Medicare Other

## 2015-04-20 ENCOUNTER — Encounter (HOSPITAL_COMMUNITY): Payer: Self-pay | Admitting: Neurology

## 2015-04-20 ENCOUNTER — Inpatient Hospital Stay (HOSPITAL_COMMUNITY)
Admission: EM | Admit: 2015-04-20 | Discharge: 2015-04-22 | DRG: 064 | Disposition: A | Discharge status: Home / Self Care | Payer: Medicare Other | Attending: Student in an Organized Health Care Education/Training Program | Admitting: Student in an Organized Health Care Education/Training Program

## 2015-04-20 DIAGNOSIS — I10 Essential (primary) hypertension: Secondary | ICD-10-CM | POA: Diagnosis present

## 2015-04-20 DIAGNOSIS — E042 Nontoxic multinodular goiter: Secondary | ICD-10-CM | POA: Diagnosis present

## 2015-04-20 DIAGNOSIS — E049 Nontoxic goiter, unspecified: Secondary | ICD-10-CM | POA: Diagnosis present

## 2015-04-20 DIAGNOSIS — I351 Nonrheumatic aortic (valve) insufficiency: Secondary | ICD-10-CM | POA: Diagnosis not present

## 2015-04-20 DIAGNOSIS — I639 Cerebral infarction, unspecified: Secondary | ICD-10-CM | POA: Diagnosis not present

## 2015-04-20 DIAGNOSIS — R29810 Facial weakness: Secondary | ICD-10-CM | POA: Diagnosis present

## 2015-04-20 DIAGNOSIS — I6789 Other cerebrovascular disease: Secondary | ICD-10-CM | POA: Diagnosis not present

## 2015-04-20 DIAGNOSIS — B20 Human immunodeficiency virus [HIV] disease: Secondary | ICD-10-CM | POA: Diagnosis present

## 2015-04-20 DIAGNOSIS — Z8679 Personal history of other diseases of the circulatory system: Secondary | ICD-10-CM

## 2015-04-20 DIAGNOSIS — I77810 Thoracic aortic ectasia: Secondary | ICD-10-CM | POA: Diagnosis present

## 2015-04-20 DIAGNOSIS — R471 Dysarthria and anarthria: Secondary | ICD-10-CM | POA: Diagnosis present

## 2015-04-20 DIAGNOSIS — Z9114 Patient's other noncompliance with medication regimen: Secondary | ICD-10-CM | POA: Diagnosis present

## 2015-04-20 HISTORY — DX: Thoracic aortic ectasia: I77.810

## 2015-04-20 HISTORY — DX: Essential (primary) hypertension: I10

## 2015-04-20 HISTORY — DX: Cerebral infarction, unspecified: I63.9

## 2015-04-20 HISTORY — DX: Nontoxic goiter, unspecified: E04.9

## 2015-04-20 LAB — I-STAT CHEM 8, ED
BUN: 14 mg/dL (ref 6–20)
CHLORIDE: 105 mmol/L (ref 101–111)
CREATININE: 1.1 mg/dL (ref 0.61–1.24)
Calcium, Ion: 1.01 mmol/L — ABNORMAL LOW (ref 1.13–1.30)
GLUCOSE: 95 mg/dL (ref 65–99)
HCT: 44 % (ref 39.0–52.0)
Hemoglobin: 15 g/dL (ref 13.0–17.0)
POTASSIUM: 4 mmol/L (ref 3.5–5.1)
Sodium: 139 mmol/L (ref 135–145)
TCO2: 25 mmol/L (ref 0–100)

## 2015-04-20 LAB — DIFFERENTIAL
BASOS ABS: 0 10*3/uL (ref 0.0–0.1)
Basophils Relative: 0 % (ref 0–1)
Eosinophils Absolute: 0.1 10*3/uL (ref 0.0–0.7)
Eosinophils Relative: 3 % (ref 0–5)
Lymphocytes Relative: 60 % — ABNORMAL HIGH (ref 12–46)
Lymphs Abs: 2.2 10*3/uL (ref 0.7–4.0)
MONOS PCT: 6 % (ref 3–12)
Monocytes Absolute: 0.2 10*3/uL (ref 0.1–1.0)
NEUTROS ABS: 1.1 10*3/uL — AB (ref 1.7–7.7)
Neutrophils Relative %: 30 % — ABNORMAL LOW (ref 43–77)

## 2015-04-20 LAB — PROTIME-INR
INR: 1.04 (ref 0.00–1.49)
Prothrombin Time: 13.8 seconds (ref 11.6–15.2)

## 2015-04-20 LAB — URINALYSIS, ROUTINE W REFLEX MICROSCOPIC
Bilirubin Urine: NEGATIVE
GLUCOSE, UA: NEGATIVE mg/dL
HGB URINE DIPSTICK: NEGATIVE
KETONES UR: NEGATIVE mg/dL
Leukocytes, UA: NEGATIVE
Nitrite: NEGATIVE
PH: 6 (ref 5.0–8.0)
PROTEIN: NEGATIVE mg/dL
Specific Gravity, Urine: 1.011 (ref 1.005–1.030)
Urobilinogen, UA: 1 mg/dL (ref 0.0–1.0)

## 2015-04-20 LAB — CBC
HCT: 38.8 % — ABNORMAL LOW (ref 39.0–52.0)
HEMATOCRIT: 40.8 % (ref 39.0–52.0)
HEMOGLOBIN: 13.5 g/dL (ref 13.0–17.0)
HEMOGLOBIN: 12.9 g/dL — AB (ref 13.0–17.0)
MCH: 28 pg (ref 26.0–34.0)
MCH: 27.8 pg (ref 26.0–34.0)
MCHC: 33.1 g/dL (ref 30.0–36.0)
MCHC: 33.2 g/dL (ref 30.0–36.0)
MCV: 84.5 fL (ref 78.0–100.0)
MCV: 83.6 fL (ref 78.0–100.0)
Platelets: 82 10*3/uL — ABNORMAL LOW (ref 150–400)
Platelets: 91 10*3/uL — ABNORMAL LOW (ref 150–400)
RBC: 4.83 MIL/uL (ref 4.22–5.81)
RBC: 4.64 MIL/uL (ref 4.22–5.81)
RDW: 14 % (ref 11.5–15.5)
RDW: 13.9 % (ref 11.5–15.5)
WBC: 3.7 10*3/uL — AB (ref 4.0–10.5)
WBC: 3.4 10*3/uL — ABNORMAL LOW (ref 4.0–10.5)

## 2015-04-20 LAB — COMPREHENSIVE METABOLIC PANEL
ALT: 11 U/L — AB (ref 17–63)
AST: 23 U/L (ref 15–41)
Albumin: 3.5 g/dL (ref 3.5–5.0)
Alkaline Phosphatase: 119 U/L (ref 38–126)
Anion gap: 8 (ref 5–15)
BUN: 9 mg/dL (ref 6–20)
CO2: 24 mmol/L (ref 22–32)
CREATININE: 1.16 mg/dL (ref 0.61–1.24)
Calcium: 9 mg/dL (ref 8.9–10.3)
Chloride: 106 mmol/L (ref 101–111)
GFR calc Af Amer: >60 mL/min (ref >60)
GLUCOSE: 95 mg/dL (ref 65–99)
Potassium: 3.5 mmol/L (ref 3.5–5.1)
SODIUM: 138 mmol/L (ref 135–145)
Total Bilirubin: 0.5 mg/dL (ref 0.3–1.2)
Total Protein: 8.1 g/dL (ref 6.5–8.1)

## 2015-04-20 LAB — TSH: TSH: 1.496 u[IU]/mL (ref 0.350–4.500)

## 2015-04-20 LAB — ETHANOL

## 2015-04-20 LAB — RAPID URINE DRUG SCREEN, HOSP PERFORMED
AMPHETAMINES: NOT DETECTED
BARBITURATES: NOT DETECTED
BENZODIAZEPINES: NOT DETECTED
Cocaine: NOT DETECTED
Opiates: NOT DETECTED
TETRAHYDROCANNABINOL: NOT DETECTED

## 2015-04-20 LAB — APTT: aPTT: 32 seconds (ref 24–37)

## 2015-04-20 LAB — I-STAT TROPONIN, ED: Troponin i, poc: 0.01 ng/mL (ref 0.00–0.08)

## 2015-04-20 MED ORDER — ASPIRIN 325 MG PO TABS
325.0000 mg | ORAL_TABLET | Freq: Every day | ORAL | Status: DC
  Administered 2015-04-20: 325 mg via ORAL
  Filled 2015-04-20: qty 1

## 2015-04-20 MED ORDER — LABETALOL HCL 5 MG/ML IV SOLN
10.0000 mg | Freq: Once | INTRAVENOUS | Status: AC
  Administered 2015-04-20: 10 mg via INTRAVENOUS
  Filled 2015-04-20: qty 4

## 2015-04-20 MED ORDER — ENOXAPARIN SODIUM 40 MG/0.4ML ~~LOC~~ SOLN
40.0000 mg | SUBCUTANEOUS | Status: DC
  Administered 2015-04-20 – 2015-04-21 (×2): 40 mg via SUBCUTANEOUS
  Filled 2015-04-20 (×2): qty 0.4

## 2015-04-20 MED ORDER — ASPIRIN 300 MG RE SUPP: 300.0000 mg | Freq: Every day | RECTAL | Status: DC

## 2015-04-20 MED ORDER — DILTIAZEM HCL 25 MG/5ML IV SOLN
18.0000 mg | Freq: Once | INTRAVENOUS | Status: AC
  Administered 2015-04-20: 18 mg via INTRAVENOUS
  Filled 2015-04-20: qty 5

## 2015-04-20 MED ORDER — IOHEXOL 350 MG/ML SOLN
80.0000 mL | Freq: Once | INTRAVENOUS | Status: AC | PRN
Start: 2015-04-20 — End: 2015-04-20
  Administered 2015-04-20: 80 mL via INTRAVENOUS

## 2015-04-20 MED ORDER — ATORVASTATIN CALCIUM 40 MG PO TABS
40.0000 mg | ORAL_TABLET | Freq: Every day | ORAL | Status: DC
  Administered 2015-04-21: 40 mg via ORAL
  Filled 2015-04-20: qty 1

## 2015-04-20 MED ORDER — LABETALOL HCL 5 MG/ML IV SOLN: 5.0000 mg | INTRAVENOUS | Status: DC | PRN

## 2015-04-20 MED ORDER — STROKE: EARLY STAGES OF RECOVERY BOOK
Freq: Once | Status: AC
  Administered 2015-04-20: 1

## 2015-04-20 NOTE — ED Notes (Signed)
Attempted report x1. 

## 2015-04-20 NOTE — Progress Notes (Signed)
Pt arrived from ED at 2030. Pt alert and oriented x4. Denies any pain. Able to ambulate with steady gait. Oriented to room. Will continue to monitor. Shella Spearing, RN

## 2015-04-20 NOTE — ED Notes (Signed)
Per pt co-worker pt has not been normal since yesterday. Pt friend states he had facial droop yesterday as well as he was having problems with weakness in his right arm, as he could not open a bottle of water.

## 2015-04-20 NOTE — ED Notes (Signed)
Pt to ED via [facility] from work - pt last seen normal 11 am by a co-worker and pt was said to be normal, same co-worker saw patient at 2 pm and was having difficulty speaking and had some minor right sided facial droop. Pt has hx of HTN and has not taken his antihypertensives in approximately 1 month. Pt is a/o x4 on arrival. Pt is hypertensive at 223/112, reported by EMS. Pt has 18 g in LFA. Pt cleared at the bridge by EDP [place] and transported to CT 3 with stroke team at bedside. After CT pt transported to D35 to continue workup.

## 2015-04-20 NOTE — ED Provider Notes (Signed)
CSN: [id number]     Arrival date & time 04/20/15  1438 History   First MD Initiated Contact with Patient 04/20/15 1506     Chief Complaint  Patient presents with  . Code Stroke    @EDPCLEARED @ (Consider location/radiation/quality/duration/timing/severity/associated sxs/prior Treatment) HPI Comments: Patient is a 69 year old male with past medical history of hypertensive, to get by hypertensive urgency in the past who presents today with right-sided facial droop that was first seen at 11 AM today. Patient relates he was at work when his coworkers began to notice that he was slurring his speech and had a facial droop. He states that one of his coworkers checked his blood pressure and noted that it was high. At that point they called EMS and patient was brought to our department for further evaluation and management. On arrival patient was a code stroke. He was taken to the CT scanner after being cleared at the bridge and initial CT scan results do show attenuation in the left middle cerebral artery distribution that could be thoroughly changes of infarct. Neurology is aware and is evaluating the patient. Patient denies any new headaches, lightheadedness, chest pain, shortness of breath, nausea, vomiting, abdominal pain. He has not had any hematuria or dysuria. He denies any previous similar symptoms in the past. Patient is known in the past to not be compliant with his blood pressure medication. States he has not taken his blood pressure medicine in the last month. Patient was admitted within the last year for hypertensive urgency. He states his facial droop as on the right. He denies any numbness or weakness in any of his 4 extremities.  The history is provided by the patient and the EMS personnel. No language interpreter was used.    Past Medical History  Diagnosis Date  . HTN (hypertension)    History reviewed. No pertinent past surgical history. Family History  Problem Relation Age of Onset   . Hypertension Mother   . Hyperlipidemia Mother    Social History  Substance Use Topics  . Smoking status: Never Smoker   . Smokeless tobacco: None  . Alcohol Use: No    Review of Systems  Constitutional: Negative for fever and chills.  HENT: Negative for congestion and rhinorrhea.   Eyes: Negative for photophobia and visual disturbance.  Respiratory: Negative for shortness of breath and wheezing.   Cardiovascular: Negative for chest pain and palpitations.  Gastrointestinal: Negative for nausea, vomiting and abdominal pain.  Genitourinary: Negative for dysuria and difficulty urinating.  Musculoskeletal: Negative for back pain and neck pain.  Skin: Negative for pallor and rash.  Neurological: Positive for facial asymmetry (R side lower facial droop) and speech difficulty (states was slurring speech earlier, now nearly resolved). Negative for syncope, weakness, light-headedness, numbness and headaches.  Psychiatric/Behavioral: Negative for confusion and agitation.  All other systems reviewed and are negative.     Allergies  Review of patient's allergies indicates no known allergies.  Home Medications   Prior to Admission medications   Not on File   BP 186/92 mmHg  Pulse 67  Temp(Src) 98.4 F (36.9 C) (Oral)  Resp 18  Ht 5\' 9"  (1.753 m)  Wt 145 lb (65.772 kg)  BMI 21.40 kg/m2  SpO2 99% Physical Exam  Constitutional: He is oriented to person, place, and time. No distress.  HENT:  Head: Normocephalic and atraumatic.  Eyes: Conjunctivae and EOM are normal.  Neck: Normal range of motion. Neck supple.  Cardiovascular: Normal rate and regular rhythm.  Murmur (4/6 systolic murmur best heard at R sternal border.) heard. Pulmonary/Chest: Effort normal and breath sounds normal. No respiratory distress. He has no wheezes.  Abdominal: Soft. He exhibits no distension. There is no tenderness. There is no guarding.  Musculoskeletal: Normal range of motion. He exhibits no  tenderness.  Neurological: He is alert and oriented to person, place, and time. A cranial nerve deficit (R side facial droop. CN otherwise intact.) is present.  5/5 strength in upper and lower bilaterally. Sensation is intact throughout.   Skin: Skin is warm and dry. He is not diaphoretic.  Psychiatric: He has a normal mood and affect. His behavior is normal.    ED Course  Procedures (including critical care time) Labs Review Labs Reviewed  CBC - Abnormal; Notable for the following:    WBC 3.7 (*)    Platelets 82 (*)    All other components within normal limits  DIFFERENTIAL - Abnormal; Notable for the following:    Neutrophils Relative % 30 (*)    Neutro Abs 1.1 (*)    Lymphocytes Relative 60 (*)    All other components within normal limits  COMPREHENSIVE METABOLIC PANEL - Abnormal; Notable for the following:    ALT 11 (*)    All other components within normal limits  CBC - Abnormal; Notable for the following:    WBC 3.4 (*)    Hemoglobin 12.9 (*)    HCT 38.8 (*)    Platelets 91 (*)    All other components within normal limits  I-STAT CHEM 8, ED - Abnormal; Notable for the following:    Calcium, Ion 1.01 (*)    All other components within normal limits  ETHANOL  PROTIME-INR  APTT  URINE RAPID DRUG SCREEN, HOSP PERFORMED  URINALYSIS, ROUTINE W REFLEX MICROSCOPIC (NOT AT [facility])  TSH  HEMOGLOBIN A1C  LIPID PANEL  HIV ANTIBODY (ROUTINE TESTING)  I-STAT TROPOININ, ED    Imaging Review Ct Angio Head W/cm &/or Wo Cm  04/20/2015   CLINICAL DATA:  Slurred speech and right facial droop.  EXAM: CT ANGIOGRAPHY HEAD AND NECK  TECHNIQUE: Multidetector CT imaging of the head and neck was performed using the standard protocol during bolus administration of intravenous contrast. Multiplanar CT image reconstructions and MIPs were obtained to evaluate the vascular anatomy. Carotid stenosis measurements (when applicable) are obtained utilizing NASCET criteria, using the distal internal  carotid diameter as the denominator.  CONTRAST:  80mL OMNIPAQUE IOHEXOL 350 MG/ML SOLN  COMPARISON:  Noncontrast head CT earlier today  FINDINGS: CT HEAD  Brain: Hypodensities in the cerebral white matter which are nonspecific but compatible with chronic small vessel ischemic disease as described on earlier head CT. Small, chronic white matter infarct in the right frontal lobe. No hydrocephalus. No gross intracranial hemorrhage, mass, midline shift, or extra-axial fluid collection.  Calvarium and skull base: No skull fracture or aggressive osseous lesions.  Paranasal sinuses: Bilateral maxillary sinus mucous retention cysts. Subtotal opacification of the right sphenoid sinus. Clear mastoid air cells.  Orbits: Unremarkable.  CTA NECK  Aortic arch: Ectatic aortic arch measuring 3.8 cm in diameter distally, with the visualized distal ascending aorta measuring 3.7 cm. Common origin of the brachiocephalic and left common carotid arteries, a normal variant. Brachiocephalic and subclavian arteries are widely patent.  Right carotid system: Patent without evidence of stenosis, dissection, or aneurysm. No significant atherosclerosis.  Left carotid system: Patent without evidence of stenosis, dissection, or aneurysm. No significant atherosclerosis.  Vertebral arteries: Patent without stenosis. Right  vertebral artery is slightly larger than the left.  Skeleton: Moderate lower cervical disc degeneration.  Other neck: Poor dentition with multiple missing teeth, multiple carious, and multiple periapical lucencies particularly in the left maxillary molar region. Diffusely enlarged thyroid with heterogeneous attenuation including a 2.1 cm nodule on the right. Small mediastinal lymph nodes as well as a mildly enlarged precarinal lymph node measuring 1.3 cm in short axis, nonspecific.  CTA HEAD  Anterior circulation: The internal carotid arteries are patent from skullbase to carotid termini without stenosis or significant  atherosclerosis. There is a 4 x 3 mm posteroinferiorly directed outpouching from the left supraclinoid ICA in the posterior communicating artery region consistent with an aneurysm. The right A1 segment is hypoplastic. Anterior communicating artery is patent. ACAs are otherwise unremarkable without evidence of stenosis. MCAs are patent without evidence of stenosis or major branch vessel occlusion.  Posterior circulation: Intracranial vertebral arteries are patent to the basilar without stenosis. Right PICA origin is patent. Left AICA is dominant and Corrie Dandy share a common origin with the left PICA. Basilar artery is tortuous without stenosis. SCA origins are patent. There is a patent right posterior communicating artery. A left posterior communicating artery is not clearly identified. PCAs are patent without evidence of significant stenosis.  Venous sinuses: Patent.  Anatomic variants: Hypoplastic right A1 segment.  Delayed phase: No abnormal enhancement.  IMPRESSION: 1. No intracranial medium or large vessel occlusion or stenosis. 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 3. No cervical carotid or vertebral artery stenosis. 4. Ectatic aortic arch. Recommend annual imaging followup by CTA or MRA. This recommendation follows 2010 ACCF/AHA/AATS/ACR/ASA/SCA/SCAI/SIR/STS/SVM Guidelines for the Diagnosis and Management of Patients with Thoracic Aortic Disease. Circulation.2010; 121: e266-e369 5. Enlarged thyroid containing multiple nodules measuring up to 2.1 cm in size. Recommend further evaluation with outpatient thyroid ultrasound.  These results were called by telephone at the time of interpretation on 04/20/2015 at 4:39 pm to Dr. Thana Farr , who verbally acknowledged these results.   Electronically Signed   By: Sebastian Ache   On: 04/20/2015 16:44   Dg Chest 2 View  04/20/2015   CLINICAL DATA:  Code stroke.  EXAM: CHEST  2 VIEW  COMPARISON:  None.  FINDINGS: The heart is upper limits of normal  in size. There is tortuosity and mild ectasia of the thoracic aorta. The lungs are clear of acute process. Suspect mild chronic bronchitic changes and bibasilar scarring. No definite infiltrates, edema or effusions. The bony thorax is intact.  IMPRESSION: Borderline cardiac enlargement and probable mild bronchitic lung changes but no acute pulmonary findings.   Electronically Signed   By: Rudie Meyer M.D.   On: 04/20/2015 20:00   Ct Head Wo Contrast  04/20/2015   CLINICAL DATA:  Right-sided facial droop and slurred speech, acute  EXAM: CT HEAD WITHOUT CONTRAST  TECHNIQUE: Contiguous axial images were obtained from the base of the skull through the vertex without intravenous contrast.  COMPARISON:  None.  FINDINGS: The ventricles are normal in size and configuration. There is no intracranial mass, hemorrhage, extra-axial fluid collection, or midline shift.  There is patchy small vessel disease in the centra semiovale bilaterally. There is evidence of a prior focal infarct adjacent to the frontal horn right lateral ventricle anteriorly. No obvious acute infarct is seen.  There is mild increased attenuation in the proximal left middle cerebral artery compared to the right.  The bony calvarium appears intact. The mastoid air cells are clear. There is mucosal  thickening in several ethmoid air cells bilaterally. There is also right-sided sphenoid sinus opacification. Mild mucosal thickening is noted in the left maxillary antrum.  IMPRESSION: Patchy supratentorial small vessel disease.  There is a degree of increased attenuation in a portion of the left middle cerebral artery compared to the right. The significance of this finding is uncertain. This finding potentially could indicate the earliest changes of a left middle cerebral artery distribution infarct.  No hemorrhage or mass effect.  Areas of paranasal sinus disease.  Critical Value/emergent results were called by telephone at the time of interpretation on  04/20/2015 at 3:02 pm to Dr. Thad Ranger, neurology, who verbally acknowledged these results.   Electronically Signed   By: Bretta Bang III M.D.   On: 04/20/2015 15:03   Ct Angio Neck W/cm &/or Wo/cm  04/20/2015   CLINICAL DATA:  Slurred speech and right facial droop.  EXAM: CT ANGIOGRAPHY HEAD AND NECK  TECHNIQUE: Multidetector CT imaging of the head and neck was performed using the standard protocol during bolus administration of intravenous contrast. Multiplanar CT image reconstructions and MIPs were obtained to evaluate the vascular anatomy. Carotid stenosis measurements (when applicable) are obtained utilizing NASCET criteria, using the distal internal carotid diameter as the denominator.  CONTRAST:  80mL OMNIPAQUE IOHEXOL 350 MG/ML SOLN  COMPARISON:  Noncontrast head CT earlier today  FINDINGS: CT HEAD  Brain: Hypodensities in the cerebral white matter which are nonspecific but compatible with chronic small vessel ischemic disease as described on earlier head CT. Small, chronic white matter infarct in the right frontal lobe. No hydrocephalus. No gross intracranial hemorrhage, mass, midline shift, or extra-axial fluid collection.  Calvarium and skull base: No skull fracture or aggressive osseous lesions.  Paranasal sinuses: Bilateral maxillary sinus mucous retention cysts. Subtotal opacification of the right sphenoid sinus. Clear mastoid air cells.  Orbits: Unremarkable.  CTA NECK  Aortic arch: Ectatic aortic arch measuring 3.8 cm in diameter distally, with the visualized distal ascending aorta measuring 3.7 cm. Common origin of the brachiocephalic and left common carotid arteries, a normal variant. Brachiocephalic and subclavian arteries are widely patent.  Right carotid system: Patent without evidence of stenosis, dissection, or aneurysm. No significant atherosclerosis.  Left carotid system: Patent without evidence of stenosis, dissection, or aneurysm. No significant atherosclerosis.  Vertebral arteries:  Patent without stenosis. Right vertebral artery is slightly larger than the left.  Skeleton: Moderate lower cervical disc degeneration.  Other neck: Poor dentition with multiple missing teeth, multiple carious, and multiple periapical lucencies particularly in the left maxillary molar region. Diffusely enlarged thyroid with heterogeneous attenuation including a 2.1 cm nodule on the right. Small mediastinal lymph nodes as well as a mildly enlarged precarinal lymph node measuring 1.3 cm in short axis, nonspecific.  CTA HEAD  Anterior circulation: The internal carotid arteries are patent from skullbase to carotid termini without stenosis or significant atherosclerosis. There is a 4 x 3 mm posteroinferiorly directed outpouching from the left supraclinoid ICA in the posterior communicating artery region consistent with an aneurysm. The right A1 segment is hypoplastic. Anterior communicating artery is patent. ACAs are otherwise unremarkable without evidence of stenosis. MCAs are patent without evidence of stenosis or major branch vessel occlusion.  Posterior circulation: Intracranial vertebral arteries are patent to the basilar without stenosis. Right PICA origin is patent. Left AICA is dominant and Corrie Dandy share a common origin with the left PICA. Basilar artery is tortuous without stenosis. SCA origins are patent. There is a patent right posterior communicating artery. A  left posterior communicating artery is not clearly identified. PCAs are patent without evidence of significant stenosis.  Venous sinuses: Patent.  Anatomic variants: Hypoplastic right A1 segment.  Delayed phase: No abnormal enhancement.  IMPRESSION: 1. No intracranial medium or large vessel occlusion or stenosis. 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 3. No cervical carotid or vertebral artery stenosis. 4. Ectatic aortic arch. Recommend annual imaging followup by CTA or MRA. This recommendation follows 2010  ACCF/AHA/AATS/ACR/ASA/SCA/SCAI/SIR/STS/SVM Guidelines for the Diagnosis and Management of Patients with Thoracic Aortic Disease. Circulation.2010; 121: e266-e369 5. Enlarged thyroid containing multiple nodules measuring up to 2.1 cm in size. Recommend further evaluation with outpatient thyroid ultrasound.  These results were called by telephone at the time of interpretation on 04/20/2015 at 4:39 pm to Dr. Thana Farr , who verbally acknowledged these results.   Electronically Signed   By: Sebastian Ache   On: 04/20/2015 16:44   Mr Brain Wo Contrast  04/20/2015   CLINICAL DATA:  Patient with known history of hypertension who ran out of medication last month. Last seen normal at 1100 hr earlier today. Presents with subsequent onset of RIGHT facial droop and slurred speech. Current blood pressure reported as 208/100.  EXAM: MRI HEAD WITHOUT CONTRAST  TECHNIQUE: Multiplanar, multiecho pulse sequences of the brain and surrounding structures were obtained without intravenous contrast.  COMPARISON:  CT head, as well as CT angio head and neck, earlier today.  FINDINGS: Moderate-sized area of restricted diffusion affects the LEFT centrum semiovale, perhaps involving the superior portion of the LEFT lentiform nucleus, extending to the periventricular white matter, consistent with an acute LEFT MCA lenticulostriate territory infarct. No hemorrhage.  No associated mass lesion, hydrocephalus, or extra-axial fluid.  Mild atrophy. Moderately advanced small vessel disease. 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 Scattered areas of chronic lacunar infarction, most notable in the RIGHT frontal subcortical and periventricular white matter. No midline shift. Dolichoectatic but widely patent intracranial vasculature. No midline abnormalities. Extracranial soft  tissues unremarkable.  Compared with prior imaging studies earlier today, the acute infarct is not visible.  IMPRESSION: Moderate-sized area of restricted diffusion affects the LEFT centrum semiovale, perhaps involving the superior portion of the LEFT lentiform nucleus, consistent with an acute LEFT MCA lenticulostriate territory infarct. No hemorrhagic transformation.  No visible large vessel occlusion as correlated with recent CTA and based on intracranial flow voids.  Sequelae of hypertensive cerebral vascular disease, with multiple areas of chronic hemorrhage in the brain as well as moderately advanced small vessel disease.   Electronically Signed   By: Elsie Stain M.D.   On: 04/20/2015 19:38   I have personally reviewed and evaluated these images and lab results as part of my medical decision-making.   EKG Interpretation   Date/Time:  Thursday April 20 2015 15:00:33 EDT Ventricular Rate:  72 PR Interval:  158 QRS Duration: 97 QT Interval:  396 QTC Calculation: 433 R Axis:   -58 Text Interpretation:  Sinus rhythm Probable left atrial enlargement Left  anterior fascicular block Left ventricular hypertrophy Abnormal T,  consider ischemia, lateral leads Anterior ST elevation, probably due to  LVH Sinus rhythm ST-t wave abnormality Left ventricular hypertrophy No  significant change since last tracing Abnormal ekg Confirmed by Gerhard Munch  MD [id number]) on 04/20/2015 3:04:52 PM      MDM   Final diagnoses:  Facial droop  History of hypertension  Patient is a 69 year old male with past medical history of hypertensive, known to have hypertensive urgency in the past who presents today with right-sided facial droop that was first seen at 11 AM today. Findings concerning today for acute ischemic cerebral infarct. Code stroke called. Following neurology eval no TPA is recommended. CTA obtained and following this neurology recommends admission for further care by medicine. Discussed with  medicine team who will admit for further evaluation. Pt started on diltiazem IV for BP control as hypertensive emergency leading to focal neuro deficit could also be culprit in today's presentation. Patient BP responded well to this. Pt was stable at time of transfer to the floor.     Madolyn Frieze, MD 04/21/15 1610  Gerhard Munch, MD 04/24/15 [phone]

## 2015-04-20 NOTE — ED Notes (Signed)
Pt transported to MRI 

## 2015-04-20 NOTE — H&P (Signed)
Date: 04/20/2015               Patient Name:  Thomas [name] MRN: [id number]  DOB: May 28, 1946 Age / Sex: 69 y.o., male   PCP: No primary care provider on file.         Medical Service: Internal Medicine Teaching Service         Attending Physician: Dr. Erlinda Hong    First Contact: Dr. Darreld Mclean Pager: [phone]  Second Contact: Dr. Carlynn Purl Pager: [phone]       After Hours (After 5p/  First Contact Pager: [phone]  weekends / holidays): Second Contact Pager: [phone]   Chief Complaint: Facial drooping and slurred speech  History of Present Illness: Thomas [name] is a 69 year old male with PMH of HTN who presents today with right sided facial drooping and slurred speech. Patient was started on Lisinopril-HCTZ 10-12.5 mg 6 months ago after admission for hypertensive urgency. He has not taken for the last month as he states he has been too busy at work to pick up a prescription and did not follow up with his appointment at [facility]. He is accompanied by his co-worker who provides much of the history. Patient was last seen at his baseline around 11:00 am at work this morning. Later on he was noticed to have right facial drooping, slurring of his speech, and difficulty getting his words out. The patient said he felt fine at the time, but co-workers said these changes were noticeably different from his baseline. He also had difficulty opening a juice bottle with his left hand. His coworker mentions she thought some of this may have started yesterday. Coworkers called EMS and was found to be hypertensive around 220/110. He was awake and oriented this whole time. He denies any headache, change in vision, neck pain, chest pain, SOB, numbness or tingling, change in taste, difficulty swallowing, change in gait, dizziness, nausea, vomiting, dysuria, seizure, or any similar episodes in the past.  In the ED CT head shows no active bleeding but attenuation of left middle cerebral artery  possibly of ischemic etiology. He was given Diltiazem 18 mg once and Labetalol 10 mg once in the ED with drop in BP to 148/90.   Vitals on arrival to ED: BP 208/100, pulse 70, RR 19, SpO2 98% on room air, Temp 98.8  Meds: No current facility-administered medications for this encounter.   No current outpatient prescriptions on file.    Allergies: Allergies as of 04/20/2015  . (No Known Allergies)   Past Medical History  Diagnosis Date  . HTN (hypertension)    History reviewed. No pertinent past surgical history. Family History  Problem Relation Age of Onset  . Hypertension Mother   . Hyperlipidemia Mother    Social History   Social History  . Marital Status: Single    Spouse Name: N/A  . Number of Children: N/A  . Years of Education: N/A   Occupational History  . Not on file.   Social History Main Topics  . Smoking status: Never Smoker   . Smokeless tobacco: Not on file  . Alcohol Use: No  . Drug Use: No  . Sexual Activity: Not on file   Other Topics Concern  . Not on file   Social History Narrative    Review of Systems: Pertinent items are noted in HPI.  Physical Exam: Blood pressure 213/98, pulse 76, temperature 98.8 F (37.1 C), temperature source Oral, resp. rate 22, height 5\' 9"  (  1.753 m), weight 158 lb 1.1 oz (71.7 kg), SpO2 97 %. Physical Exam  Constitutional: He is oriented to person, place, and time. He appears well-developed and well-nourished. No distress.  HENT:  Head: Normocephalic and atraumatic.  Bilateral red punctate lesions noted in mouth.  Eyes: Pupils are equal, round, and reactive to light.  There is nystagmus of both eyes when scanning from left to right.  Neck: Carotid bruit is not present.  Cardiovascular: Normal rate and intact distal pulses.   Mid to late systolic murmur heard best at LUS border. On auscultation, rhythm appeared irregular however palpation of radial pulse was regular.  Pulmonary/Chest: Effort normal. He has no  wheezes. He exhibits no tenderness.  Bibasial crackles heard.  Abdominal: Soft. Bowel sounds are normal. He exhibits no distension. There is no tenderness.  Musculoskeletal: Normal range of motion. He exhibits no edema or tenderness.  Feet:  Right Foot:  Skin Integrity: Positive for dry skin.  Left Foot:  Skin Integrity: Positive for dry skin.  Neurological: He is alert and oriented to person, place, and time. He has normal strength and normal reflexes. No cranial nerve deficit or sensory deficit. Coordination normal.  Slight drooping on right side of face at the mouth. Patient had difficulty forming full smile on right side. Flat affect.  Skin: Skin is warm and dry. No rash noted.  Psychiatric: His affect is blunt.  Speech is slightly slow and slurred.     Lab results: Basic Metabolic Panel:  Recent Labs  16/10/96 1443 04/20/15 1448  NA 138 139  K 3.5 4.0  CL 106 105  CO2 24  --   GLUCOSE 95 95  BUN 9 14  CREATININE 1.16 1.10  CALCIUM 9.0  --    Liver Function Tests:  Recent Labs  04/20/15 1443  AST 23  ALT 11*  ALKPHOS 119  BILITOT 0.5  PROT 8.1  ALBUMIN 3.5   No results for input(s): LIPASE, AMYLASE in the last 72 hours. No results for input(s): AMMONIA in the last 72 hours. CBC:  Recent Labs  04/20/15 1443 04/20/15 1448  WBC 3.7*  --   NEUTROABS 1.1*  --   HGB 13.5 15.0  HCT 40.8 44.0  MCV 84.5  --   PLT 82*  --    Cardiac Enzymes: No results for input(s): CKTOTAL, CKMB, CKMBINDEX, TROPONINI in the last 72 hours. BNP: No results for input(s): PROBNP in the last 72 hours. D-Dimer: No results for input(s): DDIMER in the last 72 hours. CBG: No results for input(s): GLUCAP in the last 72 hours. Hemoglobin A1C: No results for input(s): HGBA1C in the last 72 hours. Fasting Lipid Panel: No results for input(s): CHOL, HDL, LDLCALC, TRIG, CHOLHDL, LDLDIRECT in the last 72 hours. Thyroid Function Tests: No results for input(s): TSH, T4TOTAL, FREET4,  T3FREE, THYROIDAB in the last 72 hours. Anemia Panel: No results for input(s): VITAMINB12, FOLATE, FERRITIN, TIBC, IRON, RETICCTPCT in the last 72 hours. Coagulation:  Recent Labs  04/20/15 1443  LABPROT 13.8  INR 1.04   Urine Drug Screen: Drugs of Abuse     Component Value Date/Time   LABOPIA NONE DETECTED 04/20/2015 1534   COCAINSCRNUR NONE DETECTED 04/20/2015 1534   LABBENZ NONE DETECTED 04/20/2015 1534   AMPHETMU NONE DETECTED 04/20/2015 1534   THCU NONE DETECTED 04/20/2015 1534   LABBARB NONE DETECTED 04/20/2015 1534    Alcohol Level:  Recent Labs  04/20/15 1446  ETH <5   Urinalysis:  Recent Labs  04/20/15  1534  COLORURINE YELLOW  LABSPEC 1.011  PHURINE 6.0  GLUCOSEU NEGATIVE  HGBUR NEGATIVE  BILIRUBINUR NEGATIVE  KETONESUR NEGATIVE  PROTEINUR NEGATIVE  UROBILINOGEN 1.0  NITRITE NEGATIVE  LEUKOCYTESUR NEGATIVE    Imaging results:  Ct Angio Head W/cm &/or Wo Cm  04/20/2015   CLINICAL DATA:  Slurred speech and right facial droop.  EXAM: CT ANGIOGRAPHY HEAD AND NECK  TECHNIQUE: Multidetector CT imaging of the head and neck was performed using the standard protocol during bolus administration of intravenous contrast. Multiplanar CT image reconstructions and MIPs were obtained to evaluate the vascular anatomy. Carotid stenosis measurements (when applicable) are obtained utilizing NASCET criteria, using the distal internal carotid diameter as the denominator.  CONTRAST:  80mL OMNIPAQUE IOHEXOL 350 MG/ML SOLN  COMPARISON:  Noncontrast head CT earlier today  FINDINGS: CT HEAD  Brain: Hypodensities in the cerebral white matter which are nonspecific but compatible with chronic small vessel ischemic disease as described on earlier head CT. Small, chronic white matter infarct in the right frontal lobe. No hydrocephalus. No gross intracranial hemorrhage, mass, midline shift, or extra-axial fluid collection.  Calvarium and skull base: No skull fracture or aggressive osseous  lesions.  Paranasal sinuses: Bilateral maxillary sinus mucous retention cysts. Subtotal opacification of the right sphenoid sinus. Clear mastoid air cells.  Orbits: Unremarkable.  CTA NECK  Aortic arch: Ectatic aortic arch measuring 3.8 cm in diameter distally, with the visualized distal ascending aorta measuring 3.7 cm. Common origin of the brachiocephalic and left common carotid arteries, a normal variant. Brachiocephalic and subclavian arteries are widely patent.  Right carotid system: Patent without evidence of stenosis, dissection, or aneurysm. No significant atherosclerosis.  Left carotid system: Patent without evidence of stenosis, dissection, or aneurysm. No significant atherosclerosis.  Vertebral arteries: Patent without stenosis. Right vertebral artery is slightly larger than the left.  Skeleton: Moderate lower cervical disc degeneration.  Other neck: Poor dentition with multiple missing teeth, multiple carious, and multiple periapical lucencies particularly in the left maxillary molar region. Diffusely enlarged thyroid with heterogeneous attenuation including a 2.1 cm nodule on the right. Small mediastinal lymph nodes as well as a mildly enlarged precarinal lymph node measuring 1.3 cm in short axis, nonspecific.  CTA HEAD  Anterior circulation: The internal carotid arteries are patent from skullbase to carotid termini without stenosis or significant atherosclerosis. There is a 4 x 3 mm posteroinferiorly directed outpouching from the left supraclinoid ICA in the posterior communicating artery region consistent with an aneurysm. The right A1 segment is hypoplastic. Anterior communicating artery is patent. ACAs are otherwise unremarkable without evidence of stenosis. MCAs are patent without evidence of stenosis or major branch vessel occlusion.  Posterior circulation: Intracranial vertebral arteries are patent to the basilar without stenosis. Right PICA origin is patent. Left AICA is dominant and Corrie Dandy share  a common origin with the left PICA. Basilar artery is tortuous without stenosis. SCA origins are patent. There is a patent right posterior communicating artery. A left posterior communicating artery is not clearly identified. PCAs are patent without evidence of significant stenosis.  Venous sinuses: Patent.  Anatomic variants: Hypoplastic right A1 segment.  Delayed phase: No abnormal enhancement.  IMPRESSION: 1. No intracranial medium or large vessel occlusion or stenosis. 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 3. No cervical carotid or vertebral artery stenosis. 4. Ectatic aortic arch. Recommend annual imaging followup by CTA or MRA. This recommendation follows 2010 ACCF/AHA/AATS/ACR/ASA/SCA/SCAI/SIR/STS/SVM Guidelines for the Diagnosis and Management of Patients with Thoracic Aortic Disease. Circulation.2010;  121: e266-e369 5. Enlarged thyroid containing multiple nodules measuring up to 2.1 cm in size. Recommend further evaluation with outpatient thyroid ultrasound.  These results were called by telephone at the time of interpretation on 04/20/2015 at 4:39 pm to Dr. Thana Farr , who verbally acknowledged these results.   Electronically Signed   By: Sebastian Ache   On: 04/20/2015 16:44   Ct Head Wo Contrast  04/20/2015   CLINICAL DATA:  Right-sided facial droop and slurred speech, acute  EXAM: CT HEAD WITHOUT CONTRAST  TECHNIQUE: Contiguous axial images were obtained from the base of the skull through the vertex without intravenous contrast.  COMPARISON:  None.  FINDINGS: The ventricles are normal in size and configuration. There is no intracranial mass, hemorrhage, extra-axial fluid collection, or midline shift.  There is patchy small vessel disease in the centra semiovale bilaterally. There is evidence of a prior focal infarct adjacent to the frontal horn right lateral ventricle anteriorly. No obvious acute infarct is seen.  There is mild increased attenuation in the proximal left  middle cerebral artery compared to the right.  The bony calvarium appears intact. The mastoid air cells are clear. There is mucosal thickening in several ethmoid air cells bilaterally. There is also right-sided sphenoid sinus opacification. Mild mucosal thickening is noted in the left maxillary antrum.  IMPRESSION: Patchy supratentorial small vessel disease.  There is a degree of increased attenuation in a portion of the left middle cerebral artery compared to the right. The significance of this finding is uncertain. This finding potentially could indicate the earliest changes of a left middle cerebral artery distribution infarct.  No hemorrhage or mass effect.  Areas of paranasal sinus disease.  Critical Value/emergent results were called by telephone at the time of interpretation on 04/20/2015 at 3:02 pm to Dr. Thad Ranger, neurology, who verbally acknowledged these results.   Electronically Signed   By: Bretta Bang III M.D.   On: 04/20/2015 15:03   Ct Angio Neck W/cm &/or Wo/cm  04/20/2015   CLINICAL DATA:  Slurred speech and right facial droop.  EXAM: CT ANGIOGRAPHY HEAD AND NECK  TECHNIQUE: Multidetector CT imaging of the head and neck was performed using the standard protocol during bolus administration of intravenous contrast. Multiplanar CT image reconstructions and MIPs were obtained to evaluate the vascular anatomy. Carotid stenosis measurements (when applicable) are obtained utilizing NASCET criteria, using the distal internal carotid diameter as the denominator.  CONTRAST:  80mL OMNIPAQUE IOHEXOL 350 MG/ML SOLN  COMPARISON:  Noncontrast head CT earlier today  FINDINGS: CT HEAD  Brain: Hypodensities in the cerebral white matter which are nonspecific but compatible with chronic small vessel ischemic disease as described on earlier head CT. Small, chronic white matter infarct in the right frontal lobe. No hydrocephalus. No gross intracranial hemorrhage, mass, midline shift, or extra-axial fluid  collection.  Calvarium and skull base: No skull fracture or aggressive osseous lesions.  Paranasal sinuses: Bilateral maxillary sinus mucous retention cysts. Subtotal opacification of the right sphenoid sinus. Clear mastoid air cells.  Orbits: Unremarkable.  CTA NECK  Aortic arch: Ectatic aortic arch measuring 3.8 cm in diameter distally, with the visualized distal ascending aorta measuring 3.7 cm. Common origin of the brachiocephalic and left common carotid arteries, a normal variant. Brachiocephalic and subclavian arteries are widely patent.  Right carotid system: Patent without evidence of stenosis, dissection, or aneurysm. No significant atherosclerosis.  Left carotid system: Patent without evidence of stenosis, dissection, or aneurysm. No significant atherosclerosis.  Vertebral arteries: Patent without  stenosis. Right vertebral artery is slightly larger than the left.  Skeleton: Moderate lower cervical disc degeneration.  Other neck: Poor dentition with multiple missing teeth, multiple carious, and multiple periapical lucencies particularly in the left maxillary molar region. Diffusely enlarged thyroid with heterogeneous attenuation including a 2.1 cm nodule on the right. Small mediastinal lymph nodes as well as a mildly enlarged precarinal lymph node measuring 1.3 cm in short axis, nonspecific.  CTA HEAD  Anterior circulation: The internal carotid arteries are patent from skullbase to carotid termini without stenosis or significant atherosclerosis. There is a 4 x 3 mm posteroinferiorly directed outpouching from the left supraclinoid ICA in the posterior communicating artery region consistent with an aneurysm. The right A1 segment is hypoplastic. Anterior communicating artery is patent. ACAs are otherwise unremarkable without evidence of stenosis. MCAs are patent without evidence of stenosis or major branch vessel occlusion.  Posterior circulation: Intracranial vertebral arteries are patent to the basilar  without stenosis. Right PICA origin is patent. Left AICA is dominant and Corrie Dandy share a common origin with the left PICA. Basilar artery is tortuous without stenosis. SCA origins are patent. There is a patent right posterior communicating artery. A left posterior communicating artery is not clearly identified. PCAs are patent without evidence of significant stenosis.  Venous sinuses: Patent.  Anatomic variants: Hypoplastic right A1 segment.  Delayed phase: No abnormal enhancement.  IMPRESSION: 1. No intracranial medium or large vessel occlusion or stenosis. 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 3. No cervical carotid or vertebral artery stenosis. 4. Ectatic aortic arch. Recommend annual imaging followup by CTA or MRA. This recommendation follows 2010 ACCF/AHA/AATS/ACR/ASA/SCA/SCAI/SIR/STS/SVM Guidelines for the Diagnosis and Management of Patients with Thoracic Aortic Disease. Circulation.2010; 121: e266-e369 5. Enlarged thyroid containing multiple nodules measuring up to 2.1 cm in size. Recommend further evaluation with outpatient thyroid ultrasound.  These results were called by telephone at the time of interpretation on 04/20/2015 at 4:39 pm to Dr. Thana Farr , who verbally acknowledged these results.   Electronically Signed   By: Sebastian Ache   On: 04/20/2015 16:44   Mr Brain Wo Contrast  04/20/2015   CLINICAL DATA:  Patient with known history of hypertension who ran out of medication last month. Last seen normal at 1100 hr earlier today. Presents with subsequent onset of RIGHT facial droop and slurred speech. Current blood pressure reported as 208/100.  EXAM: MRI HEAD WITHOUT CONTRAST  TECHNIQUE: Multiplanar, multiecho pulse sequences of the brain and surrounding structures were obtained without intravenous contrast.  COMPARISON:  CT head, as well as CT angio head and neck, earlier today.  FINDINGS: Moderate-sized area of restricted diffusion affects the LEFT centrum semiovale,  perhaps involving the superior portion of the LEFT lentiform nucleus, extending to the periventricular white matter, consistent with an acute LEFT MCA lenticulostriate territory infarct. No hemorrhage.  No associated mass lesion, hydrocephalus, or extra-axial fluid.  Mild atrophy. Moderately advanced small vessel disease. 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 Scattered areas of chronic lacunar infarction, most notable in the RIGHT frontal subcortical and periventricular white matter. No midline shift. Dolichoectatic but widely patent intracranial vasculature. No midline abnormalities. Extracranial soft tissues unremarkable.  Compared with prior imaging studies earlier today, the acute infarct is not visible.  IMPRESSION: Moderate-sized area of restricted diffusion affects the LEFT centrum semiovale, perhaps involving the superior portion of the LEFT lentiform nucleus, consistent  with an acute LEFT MCA lenticulostriate territory infarct. No hemorrhagic transformation.  No visible large vessel occlusion as correlated with recent CTA and based on intracranial flow voids.  Sequelae of hypertensive cerebral vascular disease, with multiple areas of chronic hemorrhage in the brain as well as moderately advanced small vessel disease.   Electronically Signed   By: Elsie Stain M.D.   On: 04/20/2015 19:38    Other results: EKG: Sinus rhythm, t-wave inversions V5-6, left atrial enlargement. ST elevation V2-3, depression aVF  Assessment & Plan by Problem: Active Problems:   CVA (cerebral infarction)   HTN (hypertension)  CVA (cerebral infarction): Patient symptoms of right sided facial drooping, slurred/difficult speech, and left hand weakness are suggestive of acute stroke. CT head shows no active bleeding but attenuation of left middle cerebral  artery possibly ischemic etiology. Patient has initial NIHSS of 1 and not considered for tPA. Neurology consulted and following. Will have to workup for possible causes of stroke. Will also evaluate bibasilar crackles and thyroid nodules seen on CT. Ischemic stroke likely secondary to hypertensive in the setting of medication non-adherence. -Aspirin 325 mg daily -Monitor Blood Pressure -MRI of brain w/o contrast -> acute LEFT MCA lenticulostriate infarct -Echocardiogram -CTA of head and neck -> unremarkable -CXR -TSH -Telemetry -Frequent neuro checks -NPO until stroke swallow screen -Hgb A1C, Lipid panel, HIV antibody  HTN: Patient has history of hypertensive emergency 6 months ago. Has not taken his blood pressure medication for the last month. He was given Diltiazem 18 mg once and Labetalol 10 mg once in the ED with drop in BP to 148/90. Will need to avoid quickly dropping his blood pressure. -Hold Lisinopril-HCTZ 10-12.5 mg and anti-hypertensive medications -Permissive HTN up to 220/110, if above then labetalol 5 mg IV q2h prn  DVT ppx: lovenox  Code: Full   Dispo: Disposition is deferred at this time, awaiting improvement of current medical problems. Anticipated discharge in approximately 3 day(s).   The patient does not have a current PCP (No primary care provider on file.) and does need an [facility] hospital follow-up appointment after discharge.  The patient does not have transportation limitations that hinder transportation to clinic appointments.  Signed: Darreld Mclean, MD 04/20/2015, 7:54 PM

## 2015-04-20 NOTE — ED Notes (Signed)
Attempted report x 2 

## 2015-04-20 NOTE — Consult Note (Signed)
Referring Physician: ED MD    Chief Complaint: Right facial droop  HPI:                                                                                                                                         Thomas [name] is an 69 y.o. male with known history of HTN on Lisinopril.  He has been out of his medication for the past month.  He was last seen normal at 1100 hours when speaking with a co-worker.  Later in the day when spoken to again by a coworker it was noted that he had a right facial droop and slurred speech.  The patient had not noted the change prior to coworker noting his facial droop.  EMS was called at that time and the patient was brought in for evaluation.  Initial NIHSS of 1 for right facial droop.   Date last known well: Date: 04/20/2015 Time last known well: Time: 11:00 tPA Given: No: minimal symptoms MRankin: 0   Past Medical History  Diagnosis Date  . HTN (hypertension)     History reviewed. No pertinent past surgical history.  Family History  Problem Relation Age of Onset  . Hypertension Mother   . Hyperlipidemia Mother    Social History:  reports that he has never smoked. He does not have any smokeless tobacco history on file. He reports that he does not drink alcohol or use illicit drugs.  Allergies: No Known Allergies  Medications:                                                                                                                          Prior to Admission medications   Medication Sig Start Date End Date Taking? Authorizing Provider  lisinopril-hydrochlorothiazide (PRINZIDE,ZESTORETIC) 10-12.5 MG per tablet Take 1 tablet by mouth daily. 10/16/14   Lorenda Hatchet, MD    ROS:  History obtained from the patient  General ROS: negative for - chills, fatigue, fever, night sweats, weight gain or weight  loss Psychological ROS: negative for - behavioral disorder, hallucinations, memory difficulties, mood swings or suicidal ideation Ophthalmic ROS: negative for - blurry vision, double vision, eye pain or loss of vision ENT ROS: negative for - epistaxis, nasal discharge, oral lesions, sore throat, tinnitus or vertigo Allergy and Immunology ROS: negative for - hives or itchy/watery eyes Hematological and Lymphatic ROS: negative for - bleeding problems, bruising or swollen lymph nodes Endocrine ROS: negative for - galactorrhea, hair pattern changes, polydipsia/polyuria or temperature intolerance Respiratory ROS: negative for - cough, hemoptysis, shortness of breath or wheezing Cardiovascular ROS: negative for - chest pain, dyspnea on exertion, edema or irregular heartbeat Gastrointestinal ROS: negative for - abdominal pain, diarrhea, hematemesis, nausea/vomiting or stool incontinence Genito-Urinary ROS: negative for - dysuria, hematuria, incontinence or urinary frequency/urgency Musculoskeletal ROS: negative for - joint swelling or muscular weakness Neurological ROS: as noted in HPI Dermatological ROS: negative for rash and skin lesion changes  Neurologic Examination:                                                                                                      Blood pressure 208/100, pulse 66, temperature 98.8 F (37.1 C), temperature source Oral, resp. rate 16, height  (1.753 m), weight 71.7 kg (158 lb 1.1 oz), SpO2 100 %.  HEENT-  Normocephalic, no lesions, without obvious abnormality.  Normal external eye and conjunctiva.  Normal TM's bilaterally.  Normal auditory canals and external ears. Normal external nose, mucus membranes and septum.  Normal pharynx. Cardiovascular- S1, S2 normal, pulses palpable throughout   Lungs- chest clear, no wheezing, rales, normal symmetric air entry Abdomen- soft, non-tender; bowel sounds normal; no masses,  no organomegaly Extremities- no  edema Lymph-no adenopathy palpable Musculoskeletal-no joint tenderness, deformity or swelling Skin-warm and dry, no hyperpigmentation, vitiligo, or suspicious lesions  Neurological Examination Mental Status: Alert, oriented, thought content appropriate.  Speech fluent without evidence of aphasia.  Able to follow 3 step commands without difficulty. Cranial Nerves: II: Discs flat bilaterally; Visual fields grossly normal, pupils equal, round, reactive to light and accommodation III,IV, VI: ptosis not present, extra-ocular motions intact bilaterally V,VII: right facial droop, facial light touch sensation normal bilaterally VIII: hearing normal bilaterally IX,X: uvula rises symmetrically XI: bilateral shoulder shrug XII: midline tongue extension Motor: Right : Upper extremity   5/5    Left:     Upper extremity   5/5  Lower extremity   5/5     Lower extremity   5/5 Tone and bulk:normal tone throughout; no atrophy noted Sensory: Pinprick and light touch intact throughout, bilaterally Deep Tendon Reflexes: 2+ and symmetric throughout with no AJ Plantars: Right: downgoing   Left: downgoing Cerebellar: normal finger-to-nose and normal heel-to-shin test Gait: not tested due to safety    Lab Results: Basic Metabolic Panel:  Recent Labs Lab 04/20/15 1448  NA 139  K 4.0  CL 105  GLUCOSE 95  BUN 14  CREATININE 1.10    Liver  Function Tests: No results for input(s): AST, ALT, ALKPHOS, BILITOT, PROT, ALBUMIN in the last 168 hours. No results for input(s): LIPASE, AMYLASE in the last 168 hours. No results for input(s): AMMONIA in the last 168 hours.  CBC:  Recent Labs Lab 04/20/15 1443 04/20/15 1448  WBC 3.7*  --   NEUTROABS 1.1*  --   HGB 13.5 15.0  HCT 40.8 44.0  MCV 84.5  --   PLT 82*  --     Cardiac Enzymes: No results for input(s): CKTOTAL, CKMB, CKMBINDEX, TROPONINI in the last 168 hours.  Lipid Panel: No results for input(s): CHOL, TRIG, HDL, CHOLHDL, VLDL,  LDLCALC in the last 161 hours.  CBG: No results for input(s): GLUCAP in the last 168 hours.  Microbiology: No results found for this or any previous visit.  Coagulation Studies:  Recent Labs  04/20/15 1443  LABPROT 13.8  INR 1.04    Imaging: Ct Head Wo Contrast  04/20/2015   CLINICAL DATA:  Right-sided facial droop and slurred speech, acute  EXAM: CT HEAD WITHOUT CONTRAST  TECHNIQUE: Contiguous axial images were obtained from the base of the skull through the vertex without intravenous contrast.  COMPARISON:  None.  FINDINGS: The ventricles are normal in size and configuration. There is no intracranial mass, hemorrhage, extra-axial fluid collection, or midline shift.  There is patchy small vessel disease in the centra semiovale bilaterally. There is evidence of a prior focal infarct adjacent to the frontal horn right lateral ventricle anteriorly. No obvious acute infarct is seen.  There is mild increased attenuation in the proximal left middle cerebral artery compared to the right.  The bony calvarium appears intact. The mastoid air cells are clear. There is mucosal thickening in several ethmoid air cells bilaterally. There is also right-sided sphenoid sinus opacification. Mild mucosal thickening is noted in the left maxillary antrum.  IMPRESSION: Patchy supratentorial small vessel disease.  There is a degree of increased attenuation in a portion of the left middle cerebral artery compared to the right. The significance of this finding is uncertain. This finding potentially could indicate the earliest changes of a left middle cerebral artery distribution infarct.  No hemorrhage or mass effect.  Areas of paranasal sinus disease.  Critical Value/emergent results were called by telephone at the time of interpretation on 04/20/2015 at 3:02 pm to Dr. Thad Ranger, neurology, who verbally acknowledged these results.   Electronically Signed   By: Bretta Bang III M.D.   On: 04/20/2015 15:03    Felicie Morn PA-C [facility] [phone]  04/20/2015, 3:20 PM   Patient seen and examined.  Clinical course and management discussed.  Necessary edits performed.  I agree with the above.  Assessment and plan of care developed and discussed below.     Assessment: 69 y.o. male presenting with right facial droop and mild dysarthria.  NIHSS of 1.  Patient hypertensive and has not taken his antihypertensives in a month.  Head CT independently reviewed and shows no acute changes.  There is some question of a hyperdense left MCA.  Due to degree of symptoms patient not considered a tPA candidate.  Further work up recommended.    Stroke Risk Factors - hypertension  Plan: 1. HgbA1c, fasting lipid panel 2. MRI of the brain without contrast 3. PT consult, OT consult, Speech consult 4. Echocardiogram 5. CTA of the head and neck 6. Prophylactic therapy-Antiplatelet med: Aspirin - dose 325mg  daily 7. NPO until RN stroke swallow screen 8. Telemetry monitoring 9. Frequent  neuro checks 10. Admission for further evaluation and management of BP   Thana Farr, MD [facility] [phone]  04/20/2015  3:20 PM  Addendum: CTA of head and neck reviewed and unremarkable.    Thana Farr, MD [facility] [phone]

## 2015-04-20 NOTE — Code Documentation (Signed)
69yo male arriving to [facility] via [facility] at 1438.  Patient from work where he was LKW at 1100 by his coworker who later noticed him to be lethargic and having difficulty getting his words out.  EMS called and noted patient to be hypertensive with SBP in the 220s with right facial droop and slurred speech.  Code stroke activated.  Of note, patient with h/o HTN and has not been taking his antihypertensives x 1 month.  Stroke team at the bedside on arrival.  Labs drawn and patient cleared by Dr. Criss Alvine.  Patient to CT with team.  NIHSS 1, see documentation for details and code stroke times.  Patient with mild right facial droop on exam.  BP 199/110.  Dr. Thad Ranger at the bedside.  Patient is too mild to treat at this time with tPA, however, remains in the window until 1530 should symptoms worsen.  Patient to go for CTA per MD.  Bedside handoff with ED RN Pete Glatter.

## 2015-04-21 ENCOUNTER — Ambulatory Visit (HOSPITAL_COMMUNITY): Payer: Medicare Other

## 2015-04-21 DIAGNOSIS — I10 Essential (primary) hypertension: Secondary | ICD-10-CM

## 2015-04-21 DIAGNOSIS — I639 Cerebral infarction, unspecified: Secondary | ICD-10-CM | POA: Diagnosis present

## 2015-04-21 DIAGNOSIS — E785 Hyperlipidemia, unspecified: Secondary | ICD-10-CM

## 2015-04-21 DIAGNOSIS — Z9114 Patient's other noncompliance with medication regimen: Secondary | ICD-10-CM | POA: Diagnosis present

## 2015-04-21 DIAGNOSIS — I63312 Cerebral infarction due to thrombosis of left middle cerebral artery: Secondary | ICD-10-CM

## 2015-04-21 DIAGNOSIS — R29810 Facial weakness: Secondary | ICD-10-CM | POA: Diagnosis present

## 2015-04-21 DIAGNOSIS — I351 Nonrheumatic aortic (valve) insufficiency: Secondary | ICD-10-CM | POA: Diagnosis present

## 2015-04-21 DIAGNOSIS — I77810 Thoracic aortic ectasia: Secondary | ICD-10-CM | POA: Diagnosis present

## 2015-04-21 DIAGNOSIS — B20 Human immunodeficiency virus [HIV] disease: Secondary | ICD-10-CM | POA: Diagnosis present

## 2015-04-21 DIAGNOSIS — R471 Dysarthria and anarthria: Secondary | ICD-10-CM | POA: Diagnosis present

## 2015-04-21 DIAGNOSIS — I6789 Other cerebrovascular disease: Secondary | ICD-10-CM | POA: Diagnosis not present

## 2015-04-21 DIAGNOSIS — E042 Nontoxic multinodular goiter: Secondary | ICD-10-CM | POA: Diagnosis present

## 2015-04-21 LAB — LIPID PANEL
CHOL/HDL RATIO: 4 ratio
Cholesterol: 115 mg/dL (ref 0–200)
HDL: 29 mg/dL — AB (ref >40)
LDL Cholesterol: 79 mg/dL (ref 0–99)
Triglycerides: 36 mg/dL (ref <150)
VLDL: 7 mg/dL (ref 0–40)

## 2015-04-21 MED ORDER — ASPIRIN EC 81 MG PO TBEC
81.0000 mg | DELAYED_RELEASE_TABLET | Freq: Every day | ORAL | Status: DC
  Administered 2015-04-21 – 2015-04-22 (×2): 81 mg via ORAL
  Filled 2015-04-21 (×2): qty 1

## 2015-04-21 MED ORDER — ATORVASTATIN CALCIUM 40 MG PO TABS: 40.0000 mg | ORAL_TABLET | Freq: Every day | ORAL | Status: DC

## 2015-04-21 MED ORDER — LISINOPRIL-HYDROCHLOROTHIAZIDE 10-12.5 MG PO TABS: 1.0000 | ORAL_TABLET | Freq: Every day | ORAL | Status: DC

## 2015-04-21 MED ORDER — ASPIRIN 81 MG PO TBEC: 81.0000 mg | DELAYED_RELEASE_TABLET | Freq: Every day | ORAL | Status: DC

## 2015-04-21 NOTE — Progress Notes (Addendum)
STROKE TEAM PROGRESS NOTE   HISTORY Thomas [name] is an 69 y.o. male with known history of HTN on Lisinopril. He has been out of his medication for the past month. He was last seen normal at 1100 hours when speaking with a co-worker. Later in the day when spoken to again by a coworker it was noted that he had a right facial droop and slurred speech. The patient had not noted the change prior to coworker noting his facial droop. EMS was called at that time and the patient was brought in for evaluation. Initial NIHSS of 1 for right facial droop.   Date last known well: Date: 04/20/2015 Time last known well: Time: 11:00 tPA Given: No: minimal symptoms MRankin: 0   SUBJECTIVE (INTERVAL HISTORY) No family members present. The patient is very soft-spoken. Possible stroke AF trial candidate. Still has right facial droop but no other deficit found. BP still high.   OBJECTIVE Temp:  [98.2 F (36.8 C)-98.8 F (37.1 C)] 98.6 F (37 C) (08/19 0600) Pulse Rate:  [61-90] 67 (08/18 1959) Cardiac Rhythm:  [-] Normal sinus rhythm (08/18 2050) Resp:  [13-28] 18 (08/19 0600) BP: (148-225)/(90-113) 197/97 mmHg (08/19 0600) SpO2:  [90 %-100 %] 98 % (08/19 0600) Weight:  [65.772 kg (145 lb)-71.7 kg (158 lb 1.1 oz)] 65.772 kg (145 lb) (08/18 2030)  No results for input(s): GLUCAP in the last 168 hours.  Recent Labs Lab 04/20/15 1443 04/20/15 1448  NA 138 139  K 3.5 4.0  CL 106 105  CO2 24  --   GLUCOSE 95 95  BUN 9 14  CREATININE 1.16 1.10  CALCIUM 9.0  --     Recent Labs Lab 04/20/15 1443  AST 23  ALT 11*  ALKPHOS 119  BILITOT 0.5  PROT 8.1  ALBUMIN 3.5    Recent Labs Lab 04/20/15 1443 04/20/15 1448 04/20/15 2115  WBC 3.7*  --  3.4*  NEUTROABS 1.1*  --   --   HGB 13.5 15.0 12.9*  HCT 40.8 44.0 38.8*  MCV 84.5  --  83.6  PLT 82*  --  91*   No results for input(s): CKTOTAL, CKMB, CKMBINDEX, TROPONINI in the last 168 hours.  Recent Labs  04/20/15 1443  LABPROT  13.8  INR 1.04    Recent Labs  04/20/15 1534  COLORURINE YELLOW  LABSPEC 1.011  PHURINE 6.0  GLUCOSEU NEGATIVE  HGBUR NEGATIVE  BILIRUBINUR NEGATIVE  KETONESUR NEGATIVE  PROTEINUR NEGATIVE  UROBILINOGEN 1.0  NITRITE NEGATIVE  LEUKOCYTESUR NEGATIVE       Component Value Date/Time   CHOL 115 04/21/2015 0515   TRIG 36 04/21/2015 0515   HDL 29* 04/21/2015 0515   CHOLHDL 4.0 04/21/2015 0515   VLDL 7 04/21/2015 0515   LDLCALC 79 04/21/2015 0515   No results found for: HGBA1C    Component Value Date/Time   LABOPIA NONE DETECTED 04/20/2015 1534   COCAINSCRNUR NONE DETECTED 04/20/2015 1534   LABBENZ NONE DETECTED 04/20/2015 1534   AMPHETMU NONE DETECTED 04/20/2015 1534   THCU NONE DETECTED 04/20/2015 1534   LABBARB NONE DETECTED 04/20/2015 1534     Recent Labs Lab 04/20/15 1446  ETH <5     IMAGING I have personally reviewed the radiological images below and agree with the radiology interpretations.  Ct Angio Head W/cm &/or Wo Cm 04/20/2015    1. No intracranial medium or large vessel occlusion or stenosis.  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  3. No cervical carotid  or vertebral artery stenosis.  4. Ectatic aortic arch. Recommend annual imaging followup by CTA or MRA.  This recommendation follows 2010 ACCF/AHA/AATS/ACR/ASA/SCA/SCAI/SIR/STS/SVM Guidelines for the Diagnosis and Management of Patients with Thoracic Aortic Disease. Circulation.2010; 121: e266-e369 5.   Enlarged thyroid containing multiple nodules measuring up to 2.1 cm in size. Recommend further evaluation with outpatient thyroid ultrasound.    Dg Chest 2 View 04/20/2015    Borderline cardiac enlargement and probable mild bronchitic lung changes but no acute pulmonary findings.     Ct Head Wo Contrast 04/20/2015    Patchy supratentorial small vessel disease.   There is a degree of increased attenuation in a portion of the left middle cerebral artery compared to the right.   The significance of this finding is uncertain.  This finding potentially could indicate the earliest changes of a left middle cerebral artery distribution infarct.   No hemorrhage or mass effect.   Areas of paranasal sinus disease.    Mr Brain Wo Contrast 04/20/2015    Moderate-sized area of restricted diffusion affects the LEFT centrum semiovale, perhaps involving the superior portion of the LEFT lentiform nucleus, consistent with an acute LEFT MCA lenticulostriate territory infarct.  No hemorrhagic transformation.   No visible large vessel occlusion as correlated with recent CTA and based on intracranial flow voids.   Sequelae of hypertensive cerebral vascular disease, with multiple areas of chronic hemorrhage in the brain as well as moderately advanced small vessel disease.     2D echo - pending   PHYSICAL EXAM  Temp:  [98.2 F (36.8 C)-98.8 F (37.1 C)] 98.3 F (36.8 C) (08/19 1415) Pulse Rate:  [60-90] 60 (08/19 1415) Resp:  [13-28] 16 (08/19 1415) BP: (148-225)/(90-116) 204/98 mmHg (08/19 1415) SpO2:  [90 %-100 %] 99 % (08/19 1415) Weight:  [145 lb (65.772 kg)-158 lb 1.1 oz (71.7 kg)] 145 lb (65.772 kg) (08/18 2030)  General - Well nourished, well developed, in no apparent distress.  Ophthalmologic - Sharp disc margins OU.   Cardiovascular - Regular rate and rhythm with no murmur.  Mental Status -  Level of arousal and orientation to time, place, and person were intact. Language including expression, naming, repetition, comprehension was assessed and found intact. Fund of Knowledge was assessed and was intact.  Cranial Nerves II - XII - II - Visual field intact OU. III, IV, VI - Extraocular movements intact. V - Facial sensation intact bilaterally. VII - right lower facial weakness. VIII - Hearing & vestibular intact bilaterally. X - Palate elevates symmetrically, mild dysarthria. XI - Chin turning & shoulder shrug intact bilaterally XII - Tongue protrusion  intact.  Motor Strength - The patient's strength was normal in all extremities and pronator drift was absent.  Bulk was normal and fasciculations were absent.   Motor Tone - Muscle tone was assessed at the neck and appendages and was normal.  Reflexes - The patient's reflexes were 1+ in all extremities and he had no pathological reflexes.  Sensory - Light touch, temperature/pinprick were assessed and were symmetrical.    Coordination - The patient had normal movements in the hands and feet with no ataxia or dysmetria.  Tremor was absent.  Gait and Station - deferred due to safety concerns.   ASSESSMENT/PLAN [name] is a 69 y.o. male with history of hypertension, presenting with right facial droop and slurred speech.  He did not receive IV t-PA due to minimal deficits.   Stroke:  Dominant infarct possibly embolic from an unknown source.  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   CTA head and neck - no large vessel occlusion, left 4mm PCOM aneurysm.  2D Echo  pending  LDL 79  HgbA1c pending  Lovenox for VTE prophylaxis  Diet Heart Room service appropriate?: Yes; Fluid consistency:: Thin  no antithrombotic prior to admission, now on aspirin 81 mg orally every day.   Patient counseled to be compliant with his antithrombotic medications  Ongoing aggressive stroke risk factor management  Therapy recommendations: No follow-up occupational or physical therapies recommended.  Disposition: Pending  Hypertension  Home meds: Lisinopril / hydrochlorothiazide  Blood pressure running high  Permissive hypertension <220/120 for 24-48 hours and then gradually normalize within 5-7 days.  Patient counseled to be compliant with his blood pressure medications  Hyperlipidemia  Home meds:  No lipid lowering medications prior to admission  LDL 79, goal < 70  Now on Lipitor 40 mg daily  Continue statin at discharge  Other  Stroke Risk Factors  Advanced age  Other Active Problems  Pancytopenia - wbc's 3.4 thousand; platelets 91,000; hemoglobin 12.9  Possible Stroke AF trial candidate.  Other Pertinent History  Blood pressure 208/100 at time of admission. Patient noncompliant with an hypertensive medications.  Off BP meds for several days  Hospital day #   Delton See PA-C [facility] Pager [phone] 04/21/2015, 7:59 AM  I, the attending vascular neurologist, have personally obtained a history, examined the patient, evaluated laboratory data, individually viewed imaging studies and agree with radiology interpretations. I also discussed with pt regarding his care plan. Together with the NP/PA, we formulated the assessment and plan of care which reflects our mutual decision.  I have made any additions or clarifications directly to the above note and agree with the findings and plan as currently documented.   69 yo M with hx of HTN noncompliant with medication admitted for left BG and CR infarct, likely due to small vessel disease. BP still high but on permissive hypertension. Stroke work up with CTA head and neck showed no large vessel cut off but left PCOM 4mm aneurysm, MRI showed evidence of chronic HTN. LDL 79 and he was put on ASA and statin. 2D echo and A1C pending. No PT/OT needs. Candidate for stroke AF trial.  Marvel Plan, MD PhD Stroke Neurology 04/21/2015 2:34 PM        To contact Stroke Continuity provider, please refer to [email or web address]. After hours, contact General Neurology

## 2015-04-21 NOTE — Discharge Instructions (Addendum)
We are going to discharge you with the following medications: Aspirin 81 mg take once a day, Atorvastatin 40 mg take once a day, Lisinopril-HCTZ 10-12.5 mg take once a day. It is important that you take these medications to help prevent another episode like you had.  Please schedule a follow up visit with [facility]. Call them 1 day after you leave the hospital. Contact: [phone]  [email or web address]  STROKE/TIA DISCHARGE INSTRUCTIONS SMOKING Cigarette smoking nearly doubles your risk of having a stroke & is the single most alterable risk factor  If you smoke or have smoked in the last 12 months, you are advised to quit smoking for your health.  Most of the excess cardiovascular risk related to smoking disappears within a year of stopping.  Ask you doctor about anti-smoking medications  [place] Quit Line: 1-800-QUIT NOW  Free Smoking Cessation Classes [phone]  CHOLESTEROL Know your levels; limit fat & cholesterol in your diet  Lipid Panel     Component Value Date/Time   CHOL 115 04/21/2015 0515   TRIG 36 04/21/2015 0515   HDL 29* 04/21/2015 0515   CHOLHDL 4.0 04/21/2015 0515   VLDL 7 04/21/2015 0515   LDLCALC 79 04/21/2015 0515      Many patients benefit from treatment even if their cholesterol is at goal.  Goal: Total Cholesterol (CHOL) less than 160  Goal:  Triglycerides (TRIG) less than 150  Goal:  HDL greater than 40  Goal:  LDL (LDLCALC) less than 100   BLOOD PRESSURE American Stroke Association blood pressure target is less that 120/80 mm/Hg  Your discharge blood pressure is:  BP: (!) 191/99 mmHg (RN Notified)  Monitor your blood pressure  Limit your salt and alcohol intake  Many individuals will require more than one medication for high blood pressure  DIABETES (A1c is a blood sugar average for last 3 months) Goal HGBA1c is under 7% (HBGA1c is blood sugar average for last 3 months)    Diabetes: No known diagnosis of diabetes    Lab Results  Component Value Date   HGBA1C 6.0* 04/21/2015     Your HGBA1c can be lowered with medications, healthy diet, and exercise.  Check your blood sugar as directed by your physician  Call your physician if you experience unexplained or low blood sugars.  PHYSICAL ACTIVITY/REHABILITATION Goal is 30 minutes at least 4 days per week  Activity: Increase activity slowly, Therapies: None Return to work: As directed by MD.  Activity decreases your risk of heart attack and stroke and makes your heart stronger.  It helps control your weight and blood pressure; helps you relax and can improve your mood.  Participate in a regular exercise program.  Talk with your doctor about the best form of exercise for you (dancing, walking, swimming, cycling).  DIET/WEIGHT Goal is to maintain a healthy weight  Your discharge diet is: Diet Heart Room service appropriate?: Yes; Fluid consistency:: Thin Diet - low sodium heart healthy thin liquids Your height is:  Height: 5\' 9"  (175.3 cm) Your current weight is: Weight: 145 lb (65.772 kg) Your Body Mass Index (BMI) is:  BMI (Calculated): 21.5  Following the type of diet specifically designed for you will help prevent another stroke.  Your goal Body Mass Index (BMI) is 19-24.  Healthy food habits can help reduce 3 risk factors for stroke:  High cholesterol, hypertension, and excess weight.  RESOURCES Stroke/Support Group:  Call [phone]   STROKE EDUCATION PROVIDED/REVIEWED AND GIVEN TO PATIENT Stroke  warning signs and symptoms How to activate emergency medical system (call 911). Medications prescribed at discharge. Need for follow-up after discharge. Personal risk factors for stroke. Pneumonia vaccine given: No Flu vaccine given: No My questions have been answered, the writing is legible, and I understand these instructions.  I will adhere to these goals & educational materials that have been  provided to me after my discharge from the hospital.

## 2015-04-21 NOTE — Care Management Note (Signed)
Case Management Note  Patient Details  Name: Thomas [name] MRN: [id number] Date of Birth: 12/04/1945  Subjective/Objective:                    Action/Plan: Patient is listed as self-pay. CM and Financial Counselor, Michele, have both met with patient to discuss.  Patient states that he DOES have insurance through his employer. Patient reports that he does not have a copy of his insurance card and there is not one available.  CM will continue to follow for any additional needs at discharge.  Expected Discharge Date:                  Expected Discharge Plan:  Home/Self Care  In-House Referral:     Discharge planning Services     Post Acute Care Choice:    Choice offered to:     DME Arranged:    DME Agency:     HH Arranged:    HH Agency:     Status of Service:  Completed, signed off  Medicare Important Message Given:    Date Medicare IM Given:    Medicare IM give by:    Date Additional Medicare IM Given:    Additional Medicare Important Message give by:     If discussed at Long Length of Stay Meetings, dates discussed:    Additional Comments:  Robarge, Courtney C, RN 04/21/2015, 11:17 AM  

## 2015-04-21 NOTE — Discharge Summary (Signed)
Name: Thomas [name] MRN: [id number] DOB: Jan 28, 1946 69 y.o. PCP: No primary care provider on file.  Date of Admission: 04/20/2015  2:40 PM Date of Discharge: 04/22/2015 Attending Physician: Tyson Alias, MD  Discharge Diagnosis: 1. CVA Active Problems:   CVA (cerebral infarction)   HTN (hypertension)  Discharge Medications:   Medication List    TAKE these medications        aspirin 81 MG EC tablet  Take 1 tablet (81 mg total) by mouth daily.     atorvastatin 40 MG tablet  Commonly known as:  LIPITOR  Take 1 tablet (40 mg total) by mouth daily at 6 PM.     lisinopril-hydrochlorothiazide 10-12.5 MG per tablet  Commonly known as:  PRINZIDE,ZESTORETIC  Take 1 tablet by mouth daily.        Disposition and follow-up:   Thomas [name] was discharged from [facility] in Good condition.  At the hospital follow up visit please address:  1.  Medication adherence: Patient did not take his medication for 1 month before presentation due to his prescription running out.  Please make sure he is taking his medication  Thyroid nodules: Please follow up on thyroid nodules seen on imaging.  He needs thyroid ultrasound and biopsy.  Please also follow up on any new signs or symptoms of TIA/Stroke or residual deficits. His only deficit at time of discharge is slight facial droop on right side of mouth and slow/slurred speech.  2.  Labs / imaging needed at time of follow-up: Thyroid ultrasound.  3.  Pending labs/ test needing follow-up: none  Follow-up Appointments: Follow-up Information    Follow up with [facility]. Call in 1 day.   Contact information:   [phone]  [email or web address]      Follow up with Xu,Jindong, MD. Schedule an appointment as soon as possible for a visit in 2 months.   Specialty:  Neurology   Contact information:   [address] [place]  [address] [phone]       Discharge Instructions: Discharge Instructions    Call MD for:  difficulty breathing, headache or visual disturbances    Complete by:  As directed      Call MD for:  persistant dizziness or light-headedness    Complete by:  As directed      Call MD for:  severe uncontrolled pain    Complete by:  As directed      Diet - low sodium heart healthy    Complete by:  As directed      Increase activity slowly    Complete by:  As directed            Consultations: Treatment Team:  Md Stroke, MD  Procedures Performed:  Ct Angio Head W/cm &/or Wo Cm  04/20/2015   CLINICAL DATA:  Slurred speech and right facial droop.  EXAM: CT ANGIOGRAPHY HEAD AND NECK  TECHNIQUE: Multidetector CT imaging of the head and neck was performed using the standard protocol during bolus administration of intravenous contrast. Multiplanar CT image reconstructions and MIPs were obtained to evaluate the vascular anatomy. Carotid stenosis measurements (when applicable) are obtained utilizing NASCET criteria, using the distal internal carotid diameter as the denominator.  CONTRAST:  80mL OMNIPAQUE IOHEXOL 350 MG/ML SOLN  COMPARISON:  Noncontrast head CT earlier today  FINDINGS: CT HEAD  Brain: Hypodensities in the cerebral white matter which are nonspecific but compatible with chronic small vessel ischemic disease as  described on earlier head CT. Small, chronic white matter infarct in the right frontal lobe. No hydrocephalus. No gross intracranial hemorrhage, mass, midline shift, or extra-axial fluid collection.  Calvarium and skull base: No skull fracture or aggressive osseous lesions.  Paranasal sinuses: Bilateral maxillary sinus mucous retention cysts. Subtotal opacification of the right sphenoid sinus. Clear mastoid air cells.  Orbits: Unremarkable.  CTA NECK  Aortic arch: Ectatic aortic arch measuring 3.8 cm in diameter distally, with the visualized distal ascending aorta measuring 3.7 cm.  Common origin of the brachiocephalic and left common carotid arteries, a normal variant. Brachiocephalic and subclavian arteries are widely patent.  Right carotid system: Patent without evidence of stenosis, dissection, or aneurysm. No significant atherosclerosis.  Left carotid system: Patent without evidence of stenosis, dissection, or aneurysm. No significant atherosclerosis.  Vertebral arteries: Patent without stenosis. Right vertebral artery is slightly larger than the left.  Skeleton: Moderate lower cervical disc degeneration.  Other neck: Poor dentition with multiple missing teeth, multiple carious, and multiple periapical lucencies particularly in the left maxillary molar region. Diffusely enlarged thyroid with heterogeneous attenuation including a 2.1 cm nodule on the right. Small mediastinal lymph nodes as well as a mildly enlarged precarinal lymph node measuring 1.3 cm in short axis, nonspecific.  CTA HEAD  Anterior circulation: The internal carotid arteries are patent from skullbase to carotid termini without stenosis or significant atherosclerosis. There is a 4 x 3 mm posteroinferiorly directed outpouching from the left supraclinoid ICA in the posterior communicating artery region consistent with an aneurysm. The right A1 segment is hypoplastic. Anterior communicating artery is patent. ACAs are otherwise unremarkable without evidence of stenosis. MCAs are patent without evidence of stenosis or major branch vessel occlusion.  Posterior circulation: Intracranial vertebral arteries are patent to the basilar without stenosis. Right PICA origin is patent. Left AICA is dominant and Corrie Dandy share a common origin with the left PICA. Basilar artery is tortuous without stenosis. SCA origins are patent. There is a patent right posterior communicating artery. A left posterior communicating artery is not clearly identified. PCAs are patent without evidence of significant stenosis.  Venous sinuses: Patent.  Anatomic  variants: Hypoplastic right A1 segment.  Delayed phase: No abnormal enhancement.  IMPRESSION: 1. No intracranial medium or large vessel occlusion or stenosis. 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 3. No cervical carotid or vertebral artery stenosis. 4. Ectatic aortic arch. Recommend annual imaging followup by CTA or MRA. This recommendation follows 2010 ACCF/AHA/AATS/ACR/ASA/SCA/SCAI/SIR/STS/SVM Guidelines for the Diagnosis and Management of Patients with Thoracic Aortic Disease. Circulation.2010; 121: e266-e369 5. Enlarged thyroid containing multiple nodules measuring up to 2.1 cm in size. Recommend further evaluation with outpatient thyroid ultrasound.  These results were called by telephone at the time of interpretation on 04/20/2015 at 4:39 pm to Dr. Thana Farr , who verbally acknowledged these results.   Electronically Signed   By: Sebastian Ache   On: 04/20/2015 16:44   Dg Chest 2 View  04/20/2015   CLINICAL DATA:  Code stroke.  EXAM: CHEST  2 VIEW  COMPARISON:  None.  FINDINGS: The heart is upper limits of normal in size. There is tortuosity and mild ectasia of the thoracic aorta. The lungs are clear of acute process. Suspect mild chronic bronchitic changes and bibasilar scarring. No definite infiltrates, edema or effusions. The bony thorax is intact.  IMPRESSION: Borderline cardiac enlargement and probable mild bronchitic lung changes but no acute pulmonary findings.   Electronically Signed   By: Rudie Meyer  M.D.   On: 04/20/2015 20:00   Ct Head Wo Contrast  04/20/2015   CLINICAL DATA:  Right-sided facial droop and slurred speech, acute  EXAM: CT HEAD WITHOUT CONTRAST  TECHNIQUE: Contiguous axial images were obtained from the base of the skull through the vertex without intravenous contrast.  COMPARISON:  None.  FINDINGS: The ventricles are normal in size and configuration. There is no intracranial mass, hemorrhage, extra-axial fluid collection, or midline shift.  There is  patchy small vessel disease in the centra semiovale bilaterally. There is evidence of a prior focal infarct adjacent to the frontal horn right lateral ventricle anteriorly. No obvious acute infarct is seen.  There is mild increased attenuation in the proximal left middle cerebral artery compared to the right.  The bony calvarium appears intact. The mastoid air cells are clear. There is mucosal thickening in several ethmoid air cells bilaterally. There is also right-sided sphenoid sinus opacification. Mild mucosal thickening is noted in the left maxillary antrum.  IMPRESSION: Patchy supratentorial small vessel disease.  There is a degree of increased attenuation in a portion of the left middle cerebral artery compared to the right. The significance of this finding is uncertain. This finding potentially could indicate the earliest changes of a left middle cerebral artery distribution infarct.  No hemorrhage or mass effect.  Areas of paranasal sinus disease.  Critical Value/emergent results were called by telephone at the time of interpretation on 04/20/2015 at 3:02 pm to Dr. Thad Ranger, neurology, who verbally acknowledged these results.   Electronically Signed   By: Bretta Bang III M.D.   On: 04/20/2015 15:03   Ct Angio Neck W/cm &/or Wo/cm  04/20/2015   CLINICAL DATA:  Slurred speech and right facial droop.  EXAM: CT ANGIOGRAPHY HEAD AND NECK  TECHNIQUE: Multidetector CT imaging of the head and neck was performed using the standard protocol during bolus administration of intravenous contrast. Multiplanar CT image reconstructions and MIPs were obtained to evaluate the vascular anatomy. Carotid stenosis measurements (when applicable) are obtained utilizing NASCET criteria, using the distal internal carotid diameter as the denominator.  CONTRAST:  80mL OMNIPAQUE IOHEXOL 350 MG/ML SOLN  COMPARISON:  Noncontrast head CT earlier today  FINDINGS: CT HEAD  Brain: Hypodensities in the cerebral white matter which are  nonspecific but compatible with chronic small vessel ischemic disease as described on earlier head CT. Small, chronic white matter infarct in the right frontal lobe. No hydrocephalus. No gross intracranial hemorrhage, mass, midline shift, or extra-axial fluid collection.  Calvarium and skull base: No skull fracture or aggressive osseous lesions.  Paranasal sinuses: Bilateral maxillary sinus mucous retention cysts. Subtotal opacification of the right sphenoid sinus. Clear mastoid air cells.  Orbits: Unremarkable.  CTA NECK  Aortic arch: Ectatic aortic arch measuring 3.8 cm in diameter distally, with the visualized distal ascending aorta measuring 3.7 cm. Common origin of the brachiocephalic and left common carotid arteries, a normal variant. Brachiocephalic and subclavian arteries are widely patent.  Right carotid system: Patent without evidence of stenosis, dissection, or aneurysm. No significant atherosclerosis.  Left carotid system: Patent without evidence of stenosis, dissection, or aneurysm. No significant atherosclerosis.  Vertebral arteries: Patent without stenosis. Right vertebral artery is slightly larger than the left.  Skeleton: Moderate lower cervical disc degeneration.  Other neck: Poor dentition with multiple missing teeth, multiple carious, and multiple periapical lucencies particularly in the left maxillary molar region. Diffusely enlarged thyroid with heterogeneous attenuation including a 2.1 cm nodule on the right. Small mediastinal lymph nodes as  well as a mildly enlarged precarinal lymph node measuring 1.3 cm in short axis, nonspecific.  CTA HEAD  Anterior circulation: The internal carotid arteries are patent from skullbase to carotid termini without stenosis or significant atherosclerosis. There is a 4 x 3 mm posteroinferiorly directed outpouching from the left supraclinoid ICA in the posterior communicating artery region consistent with an aneurysm. The right A1 segment is hypoplastic. Anterior  communicating artery is patent. ACAs are otherwise unremarkable without evidence of stenosis. MCAs are patent without evidence of stenosis or major branch vessel occlusion.  Posterior circulation: Intracranial vertebral arteries are patent to the basilar without stenosis. Right PICA origin is patent. Left AICA is dominant and Corrie Dandy share a common origin with the left PICA. Basilar artery is tortuous without stenosis. SCA origins are patent. There is a patent right posterior communicating artery. A left posterior communicating artery is not clearly identified. PCAs are patent without evidence of significant stenosis.  Venous sinuses: Patent.  Anatomic variants: Hypoplastic right A1 segment.  Delayed phase: No abnormal enhancement.  IMPRESSION: 1. No intracranial medium or large vessel occlusion or stenosis. 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 3. No cervical carotid or vertebral artery stenosis. 4. Ectatic aortic arch. Recommend annual imaging followup by CTA or MRA. This recommendation follows 2010 ACCF/AHA/AATS/ACR/ASA/SCA/SCAI/SIR/STS/SVM Guidelines for the Diagnosis and Management of Patients with Thoracic Aortic Disease. Circulation.2010; 121: e266-e369 5. Enlarged thyroid containing multiple nodules measuring up to 2.1 cm in size. Recommend further evaluation with outpatient thyroid ultrasound.  These results were called by telephone at the time of interpretation on 04/20/2015 at 4:39 pm to Dr. Thana Farr , who verbally acknowledged these results.   Electronically Signed   By: Sebastian Ache   On: 04/20/2015 16:44   Mr Brain Wo Contrast  04/20/2015   CLINICAL DATA:  Patient with known history of hypertension who ran out of medication last month. Last seen normal at 1100 hr earlier today. Presents with subsequent onset of RIGHT facial droop and slurred speech. Current blood pressure reported as 208/100.  EXAM: MRI HEAD WITHOUT CONTRAST  TECHNIQUE: Multiplanar, multiecho pulse  sequences of the brain and surrounding structures were obtained without intravenous contrast.  COMPARISON:  CT head, as well as CT angio head and neck, earlier today.  FINDINGS: Moderate-sized area of restricted diffusion affects the LEFT centrum semiovale, perhaps involving the superior portion of the LEFT lentiform nucleus, extending to the periventricular white matter, consistent with an acute LEFT MCA lenticulostriate territory infarct. No hemorrhage.  No associated mass lesion, hydrocephalus, or extra-axial fluid.  Mild atrophy. Moderately advanced small vessel disease. 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 Scattered areas of chronic lacunar infarction, most notable in the RIGHT frontal subcortical and periventricular white matter. No midline shift. Dolichoectatic but widely patent intracranial vasculature. No midline abnormalities. Extracranial soft tissues unremarkable.  Compared with prior imaging studies earlier today, the acute infarct is not visible.  IMPRESSION: Moderate-sized area of restricted diffusion affects the LEFT centrum semiovale, perhaps involving the superior portion of the LEFT lentiform nucleus, consistent with an acute LEFT MCA lenticulostriate territory infarct. No hemorrhagic transformation.  No visible large vessel occlusion as correlated with recent CTA and based on intracranial flow voids.  Sequelae of hypertensive cerebral vascular disease, with multiple areas of chronic hemorrhage in the brain as well as moderately advanced small vessel disease.   Electronically Signed  By: Elsie Stain M.D.   On: 04/20/2015 19:38    2D Echo: 04/22/15 Study Conclusions  - Left ventricle: The cavity size was normal. Wall thickness was increased in a pattern of moderate LVH. Systolic function was normal. The estimated  ejection fraction was in the range of 50% to 55%. Wall motion was normal; there were no regional wall motion abnormalities. Doppler parameters are consistent with abnormal left ventricular relaxation (grade 1 diastolic dysfunction). - Aortic valve: There was mild regurgitation. - Left atrium: The atrium was moderately dilated. - Right atrium: The atrium was moderately dilated.  Impressions:  - Normal LV systolic function; grade 1 diastolic dysfunction; moderate biatrial enlargement; calcified aortic valve with mild eccentric AI; trace MR and TR.  Cardiac Cath: n/a  Admission HPI: Thomas [name] is a 69 year old male with PMH of HTN who presents today with right sided facial drooping and slurred speech. Patient was started on Lisinopril-HCTZ 10-12.5 mg 6 months ago after admission for hypertensive urgency. He has not taken for the last month as he states he has been too busy at work to pick up a prescription and did not follow up with his appointment at [facility]. He is accompanied by his co-worker who provides much of the history. Patient was last seen at his baseline around 11:00 am at work this morning. Later on he was noticed to have right facial drooping, slurring of his speech, and difficulty getting his words out. The patient said he felt fine at the time, but co-workers said these changes were noticeably different from his baseline. He also had difficulty opening a juice bottle with his left hand. His coworker mentions she thought some of this may have started yesterday. Coworkers called EMS and was found to be hypertensive around 220/110. He was awake and oriented this whole time. He denies any headache, change in vision, neck pain, chest pain, SOB, numbness or tingling, change in taste, difficulty swallowing, change in gait, dizziness, nausea, vomiting, dysuria, seizure, or any similar episodes in the past.  In the ED CT head shows no active bleeding but attenuation of  left middle cerebral artery possibly of ischemic etiology. He was given Diltiazem 18 mg once and Labetalol 10 mg once in the ED with drop in BP to 148/90.   Vitals on arrival to ED: BP 208/100, pulse 70, RR 19, SpO2 98% on room air, Temp 98.8  Hospital Course by problem list: Principal Problem:   CVA (cerebral infarction) Active Problems:   Essential hypertension   Enlarged thyroid   Ectatic thoracic aorta   Acute Stroke: Patient's symptoms and signs were consistent with an acute stroke. A non-contrast head CT was negative for hemorrhage or mass effect. CTA of head and neck was negative for vessel occlusion or stenosis. 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 Brain MRI without contrast showed an acute left MCA Lenticulostriate territory infarct with no hemorrhagic transformation. Patient was started on aspirin 81 mg daily and atorvastatin (LIPITOR) 40 mg daily. Stroke risk assessment; Lipid panel was notable for low HDL of 29. Patient discharged on Lisinopril-HCTZ 10-12.5 mg daily.  He will need to follow up with Dr Roda Shutters in 2 months.  Hypertension: Patient had a blood pressure of 201/96 on presentation to the ED, received a single dose of diltiazem (CARDIZEM) 18 mg and labetalol (NORMODYNE, TRANDATE) 10 mg and his BP dropped to 148/90. BP medications were subsequently held to allow for permissive hypertension <220/110.  On hospital discharge day he was restarted on Lisinopril HCTZ 10-12.5.  He will follow up in the [facility] at least until he finds a PCP in [facility].  Enlarged thyroid containing multiple nodules measuring up to 2.1 cm in size.  - TSH was checked and within normal limits.  Outpatient thyroid ultrasound was recommended for further evaluation  Ectatic aortic arch An ectatic aortic arch was noted on his CT angiogram for which annual imaging follow-up by CTA or MRA was recommended based on the 2010 guidelines for the diagnosis and Management of  Patients with Thoracic Aortic Disease.  Echo showed mild aortic regurgitation.  Discharge Vitals:   BP 191/99 mmHg  Pulse 68  Temp(Src) 98.1 F (36.7 C) (Oral)  Resp 16  Ht 5\' 9"  (1.753 m)  Wt 145 lb (65.772 kg)  BMI 21.40 kg/m2  SpO2 99%  Discharge Labs:  No results found for this or any previous visit (from the past 24 hour(s)).  Signed: Gust Rung, DO 04/22/2015, 9:53 AM    Services Ordered on Discharge: none Equipment Ordered on Discharge: none

## 2015-04-21 NOTE — Progress Notes (Signed)
Internal Medicine Attending:   I saw and examined the patient. I reviewed the resident's note and I agree with the resident's findings and plan as documented in the resident's note.  69 year old with hypertension admitted with left sided MCA infarct seen on MRI. Today he is doing very well with minimal residual neurologic deficit. Exam is most notable for a diastolic murmur in the aortic position, likely representing aortic regurgitation for which we are going to get an echocardiogram today for baseline values. Otherwise is cleared by physical therapy to resume normal activity. Patient neurology assistance. We will pursue aggressive secondary prevention with aspirin 81 mg and high-dose statin therapy. He'll follow up with a primary care physician in [place]. He'll be ready for discharge once his echocardiogram is complete and we will forward on the results to his PCP.

## 2015-04-21 NOTE — Progress Notes (Signed)
Subjective: Patient is laying in bed in no acute distress. He is eating and states that he is doing well. He continues to have facial droop on right side near mouth but strength and sensation is intact. He is counseled on medication adherence and the need to follow up outpatient. Objective: Vital signs in last 24 hours: Filed Vitals:   04/21/15 0000 04/21/15 0200 04/21/15 0400 04/21/15 0600  BP: 186/92 191/96 194/105 197/97  Pulse:      Temp: 98.4 F (36.9 C) 98.8 F (37.1 C) 98.4 F (36.9 C) 98.6 F (37 C)  TempSrc: Oral Oral Oral Oral  Resp: 18 18 18 18   Height:      Weight:      SpO2: 99% 99% 100% 98%   Weight change:   Intake/Output Summary (Last 24 hours) at 04/21/15 0932 Last data filed at 04/20/15 1533  Gross per 24 hour  Intake      0 ml  Output    300 ml  Net   -300 ml   General: resting in bed HEENT: PERRL, EOMI, no scleral icterus Cardiac: RRR, diastolic murmur heard best at right upper sternal border Pulm: clear to auscultation bilaterally, moving normal volumes of air Abd: soft, nontender, nondistended, BS present Ext: warm and well perfused, no pedal edema Neuro: alert and oriented X3, strength intact bilaterally in upper and lower extremities  Lab Results: Basic Metabolic Panel:  Recent Labs Lab 04/20/15 1443 04/20/15 1448  NA 138 139  K 3.5 4.0  CL 106 105  CO2 24  --   GLUCOSE 95 95  BUN 9 14  CREATININE 1.16 1.10  CALCIUM 9.0  --    Liver Function Tests:  Recent Labs Lab 04/20/15 1443  AST 23  ALT 11*  ALKPHOS 119  BILITOT 0.5  PROT 8.1  ALBUMIN 3.5   No results for input(s): LIPASE, AMYLASE in the last 168 hours. No results for input(s): AMMONIA in the last 168 hours. CBC:  Recent Labs Lab 04/20/15 1443 04/20/15 1448 04/20/15 2115  WBC 3.7*  --  3.4*  NEUTROABS 1.1*  --   --   HGB 13.5 15.0 12.9*  HCT 40.8 44.0 38.8*  MCV 84.5  --  83.6  PLT 82*  --  91*   Cardiac Enzymes: No results for input(s): CKTOTAL, CKMB,  CKMBINDEX, TROPONINI in the last 168 hours. BNP: No results for input(s): PROBNP in the last 168 hours. D-Dimer: No results for input(s): DDIMER in the last 168 hours. CBG: No results for input(s): GLUCAP in the last 168 hours. Hemoglobin A1C: No results for input(s): HGBA1C in the last 168 hours. Fasting Lipid Panel:  Recent Labs Lab 04/21/15 0515  CHOL 115  HDL 29*  LDLCALC 79  TRIG 36  CHOLHDL 4.0   Thyroid Function Tests:  Recent Labs Lab 04/20/15 2115  TSH 1.496   Coagulation:  Recent Labs Lab 04/20/15 1443  LABPROT 13.8  INR 1.04   Anemia Panel: No results for input(s): VITAMINB12, FOLATE, FERRITIN, TIBC, IRON, RETICCTPCT in the last 168 hours. Urine Drug Screen: Drugs of Abuse     Component Value Date/Time   LABOPIA NONE DETECTED 04/20/2015 1534   COCAINSCRNUR NONE DETECTED 04/20/2015 1534   LABBENZ NONE DETECTED 04/20/2015 1534   AMPHETMU NONE DETECTED 04/20/2015 1534   THCU NONE DETECTED 04/20/2015 1534   LABBARB NONE DETECTED 04/20/2015 1534    Alcohol Level:  Recent Labs Lab 04/20/15 1446  ETH <5   Urinalysis:  Recent Labs  Lab 04/20/15 1534  COLORURINE YELLOW  LABSPEC 1.011  PHURINE 6.0  GLUCOSEU NEGATIVE  HGBUR NEGATIVE  BILIRUBINUR NEGATIVE  KETONESUR NEGATIVE  PROTEINUR NEGATIVE  UROBILINOGEN 1.0  NITRITE NEGATIVE  LEUKOCYTESUR NEGATIVE    Micro Results: No results found for this or any previous visit (from the past 240 hour(s)). Studies/Results: Ct Angio Head W/cm &/or Wo Cm  04/20/2015   CLINICAL DATA:  Slurred speech and right facial droop.  EXAM: CT ANGIOGRAPHY HEAD AND NECK  TECHNIQUE: Multidetector CT imaging of the head and neck was performed using the standard protocol during bolus administration of intravenous contrast. Multiplanar CT image reconstructions and MIPs were obtained to evaluate the vascular anatomy. Carotid stenosis measurements (when applicable) are obtained utilizing NASCET criteria, using the distal  internal carotid diameter as the denominator.  CONTRAST:  80mL OMNIPAQUE IOHEXOL 350 MG/ML SOLN  COMPARISON:  Noncontrast head CT earlier today  FINDINGS: CT HEAD  Brain: Hypodensities in the cerebral white matter which are nonspecific but compatible with chronic small vessel ischemic disease as described on earlier head CT. Small, chronic white matter infarct in the right frontal lobe. No hydrocephalus. No gross intracranial hemorrhage, mass, midline shift, or extra-axial fluid collection.  Calvarium and skull base: No skull fracture or aggressive osseous lesions.  Paranasal sinuses: Bilateral maxillary sinus mucous retention cysts. Subtotal opacification of the right sphenoid sinus. Clear mastoid air cells.  Orbits: Unremarkable.  CTA NECK  Aortic arch: Ectatic aortic arch measuring 3.8 cm in diameter distally, with the visualized distal ascending aorta measuring 3.7 cm. Common origin of the brachiocephalic and left common carotid arteries, a normal variant. Brachiocephalic and subclavian arteries are widely patent.  Right carotid system: Patent without evidence of stenosis, dissection, or aneurysm. No significant atherosclerosis.  Left carotid system: Patent without evidence of stenosis, dissection, or aneurysm. No significant atherosclerosis.  Vertebral arteries: Patent without stenosis. Right vertebral artery is slightly larger than the left.  Skeleton: Moderate lower cervical disc degeneration.  Other neck: Poor dentition with multiple missing teeth, multiple carious, and multiple periapical lucencies particularly in the left maxillary molar region. Diffusely enlarged thyroid with heterogeneous attenuation including a 2.1 cm nodule on the right. Small mediastinal lymph nodes as well as a mildly enlarged precarinal lymph node measuring 1.3 cm in short axis, nonspecific.  CTA HEAD  Anterior circulation: The internal carotid arteries are patent from skullbase to carotid termini without stenosis or significant  atherosclerosis. There is a 4 x 3 mm posteroinferiorly directed outpouching from the left supraclinoid ICA in the posterior communicating artery region consistent with an aneurysm. The right A1 segment is hypoplastic. Anterior communicating artery is patent. ACAs are otherwise unremarkable without evidence of stenosis. MCAs are patent without evidence of stenosis or major branch vessel occlusion.  Posterior circulation: Intracranial vertebral arteries are patent to the basilar without stenosis. Right PICA origin is patent. Left AICA is dominant and Corrie Dandy share a common origin with the left PICA. Basilar artery is tortuous without stenosis. SCA origins are patent. There is a patent right posterior communicating artery. A left posterior communicating artery is not clearly identified. PCAs are patent without evidence of significant stenosis.  Venous sinuses: Patent.  Anatomic variants: Hypoplastic right A1 segment.  Delayed phase: No abnormal enhancement.  IMPRESSION: 1. No intracranial medium or large vessel occlusion or stenosis. 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 3. No cervical carotid or vertebral artery stenosis. 4. Ectatic aortic arch. Recommend annual imaging followup by CTA or MRA. This recommendation  follows 2010 ACCF/AHA/AATS/ACR/ASA/SCA/SCAI/SIR/STS/SVM Guidelines for the Diagnosis and Management of Patients with Thoracic Aortic Disease. Circulation.2010; 121: e266-e369 5. Enlarged thyroid containing multiple nodules measuring up to 2.1 cm in size. Recommend further evaluation with outpatient thyroid ultrasound.  These results were called by telephone at the time of interpretation on 04/20/2015 at 4:39 pm to Dr. Thana Farr , who verbally acknowledged these results.   Electronically Signed   By: Sebastian Ache   On: 04/20/2015 16:44   Dg Chest 2 View  04/20/2015   CLINICAL DATA:  Code stroke.  EXAM: CHEST  2 VIEW  COMPARISON:  None.  FINDINGS: The heart is upper limits of normal  in size. There is tortuosity and mild ectasia of the thoracic aorta. The lungs are clear of acute process. Suspect mild chronic bronchitic changes and bibasilar scarring. No definite infiltrates, edema or effusions. The bony thorax is intact.  IMPRESSION: Borderline cardiac enlargement and probable mild bronchitic lung changes but no acute pulmonary findings.   Electronically Signed   By: Rudie Meyer M.D.   On: 04/20/2015 20:00   Ct Head Wo Contrast  04/20/2015   CLINICAL DATA:  Right-sided facial droop and slurred speech, acute  EXAM: CT HEAD WITHOUT CONTRAST  TECHNIQUE: Contiguous axial images were obtained from the base of the skull through the vertex without intravenous contrast.  COMPARISON:  None.  FINDINGS: The ventricles are normal in size and configuration. There is no intracranial mass, hemorrhage, extra-axial fluid collection, or midline shift.  There is patchy small vessel disease in the centra semiovale bilaterally. There is evidence of a prior focal infarct adjacent to the frontal horn right lateral ventricle anteriorly. No obvious acute infarct is seen.  There is mild increased attenuation in the proximal left middle cerebral artery compared to the right.  The bony calvarium appears intact. The mastoid air cells are clear. There is mucosal thickening in several ethmoid air cells bilaterally. There is also right-sided sphenoid sinus opacification. Mild mucosal thickening is noted in the left maxillary antrum.  IMPRESSION: Patchy supratentorial small vessel disease.  There is a degree of increased attenuation in a portion of the left middle cerebral artery compared to the right. The significance of this finding is uncertain. This finding potentially could indicate the earliest changes of a left middle cerebral artery distribution infarct.  No hemorrhage or mass effect.  Areas of paranasal sinus disease.  Critical Value/emergent results were called by telephone at the time of interpretation on  04/20/2015 at 3:02 pm to Dr. Thad Ranger, neurology, who verbally acknowledged these results.   Electronically Signed   By: Bretta Bang III M.D.   On: 04/20/2015 15:03   Ct Angio Neck W/cm &/or Wo/cm  04/20/2015   CLINICAL DATA:  Slurred speech and right facial droop.  EXAM: CT ANGIOGRAPHY HEAD AND NECK  TECHNIQUE: Multidetector CT imaging of the head and neck was performed using the standard protocol during bolus administration of intravenous contrast. Multiplanar CT image reconstructions and MIPs were obtained to evaluate the vascular anatomy. Carotid stenosis measurements (when applicable) are obtained utilizing NASCET criteria, using the distal internal carotid diameter as the denominator.  CONTRAST:  80mL OMNIPAQUE IOHEXOL 350 MG/ML SOLN  COMPARISON:  Noncontrast head CT earlier today  FINDINGS: CT HEAD  Brain: Hypodensities in the cerebral white matter which are nonspecific but compatible with chronic small vessel ischemic disease as described on earlier head CT. Small, chronic white matter infarct in the right frontal lobe. No hydrocephalus. No gross intracranial hemorrhage, mass,  midline shift, or extra-axial fluid collection.  Calvarium and skull base: No skull fracture or aggressive osseous lesions.  Paranasal sinuses: Bilateral maxillary sinus mucous retention cysts. Subtotal opacification of the right sphenoid sinus. Clear mastoid air cells.  Orbits: Unremarkable.  CTA NECK  Aortic arch: Ectatic aortic arch measuring 3.8 cm in diameter distally, with the visualized distal ascending aorta measuring 3.7 cm. Common origin of the brachiocephalic and left common carotid arteries, a normal variant. Brachiocephalic and subclavian arteries are widely patent.  Right carotid system: Patent without evidence of stenosis, dissection, or aneurysm. No significant atherosclerosis.  Left carotid system: Patent without evidence of stenosis, dissection, or aneurysm. No significant atherosclerosis.  Vertebral arteries:  Patent without stenosis. Right vertebral artery is slightly larger than the left.  Skeleton: Moderate lower cervical disc degeneration.  Other neck: Poor dentition with multiple missing teeth, multiple carious, and multiple periapical lucencies particularly in the left maxillary molar region. Diffusely enlarged thyroid with heterogeneous attenuation including a 2.1 cm nodule on the right. Small mediastinal lymph nodes as well as a mildly enlarged precarinal lymph node measuring 1.3 cm in short axis, nonspecific.  CTA HEAD  Anterior circulation: The internal carotid arteries are patent from skullbase to carotid termini without stenosis or significant atherosclerosis. There is a 4 x 3 mm posteroinferiorly directed outpouching from the left supraclinoid ICA in the posterior communicating artery region consistent with an aneurysm. The right A1 segment is hypoplastic. Anterior communicating artery is patent. ACAs are otherwise unremarkable without evidence of stenosis. MCAs are patent without evidence of stenosis or major branch vessel occlusion.  Posterior circulation: Intracranial vertebral arteries are patent to the basilar without stenosis. Right PICA origin is patent. Left AICA is dominant and Corrie Dandy share a common origin with the left PICA. Basilar artery is tortuous without stenosis. SCA origins are patent. There is a patent right posterior communicating artery. A left posterior communicating artery is not clearly identified. PCAs are patent without evidence of significant stenosis.  Venous sinuses: Patent.  Anatomic variants: Hypoplastic right A1 segment.  Delayed phase: No abnormal enhancement.  IMPRESSION: 1. No intracranial medium or large vessel occlusion or stenosis. 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 3. No cervical carotid or vertebral artery stenosis. 4. Ectatic aortic arch. Recommend annual imaging followup by CTA or MRA. This recommendation follows 2010  ACCF/AHA/AATS/ACR/ASA/SCA/SCAI/SIR/STS/SVM Guidelines for the Diagnosis and Management of Patients with Thoracic Aortic Disease. Circulation.2010; 121: e266-e369 5. Enlarged thyroid containing multiple nodules measuring up to 2.1 cm in size. Recommend further evaluation with outpatient thyroid ultrasound.  These results were called by telephone at the time of interpretation on 04/20/2015 at 4:39 pm to Dr. Thana Farr , who verbally acknowledged these results.   Electronically Signed   By: Sebastian Ache   On: 04/20/2015 16:44   Mr Brain Wo Contrast  04/20/2015   CLINICAL DATA:  Patient with known history of hypertension who ran out of medication last month. Last seen normal at 1100 hr earlier today. Presents with subsequent onset of RIGHT facial droop and slurred speech. Current blood pressure reported as 208/100.  EXAM: MRI HEAD WITHOUT CONTRAST  TECHNIQUE: Multiplanar, multiecho pulse sequences of the brain and surrounding structures were obtained without intravenous contrast.  COMPARISON:  CT head, as well as CT angio head and neck, earlier today.  FINDINGS: Moderate-sized area of restricted diffusion affects the LEFT centrum semiovale, perhaps involving the superior portion of the LEFT lentiform nucleus, extending to the periventricular white matter, consistent with an  acute LEFT MCA lenticulostriate territory infarct. No hemorrhage.  No associated mass lesion, hydrocephalus, or extra-axial fluid.  Mild atrophy. Moderately advanced small vessel disease. 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 Scattered areas of chronic lacunar infarction, most notable in the RIGHT frontal subcortical and periventricular white matter. No midline shift. Dolichoectatic but widely patent intracranial vasculature. No midline abnormalities. Extracranial soft  tissues unremarkable.  Compared with prior imaging studies earlier today, the acute infarct is not visible.  IMPRESSION: Moderate-sized area of restricted diffusion affects the LEFT centrum semiovale, perhaps involving the superior portion of the LEFT lentiform nucleus, consistent with an acute LEFT MCA lenticulostriate territory infarct. No hemorrhagic transformation.  No visible large vessel occlusion as correlated with recent CTA and based on intracranial flow voids.  Sequelae of hypertensive cerebral vascular disease, with multiple areas of chronic hemorrhage in the brain as well as moderately advanced small vessel disease.   Electronically Signed   By: Elsie Stain M.D.   On: 04/20/2015 19:38   Medications: I have reviewed the patient's current medications. Scheduled Meds: . aspirin EC  81 mg Oral Daily  . atorvastatin  40 mg Oral q1800  . 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 Patient has risk factors of HTN. LDL is 79 and he denies any smoking history or previous stroke-like episodes. Patient has continued right sided facial droop with some slurring of speech, but strength and sensation is intact bilaterally. -Can switch to Aspirin 81 mg, stop Aspirin 325 mg -Permissive HTN <220/110 for 24-48 hours with gradual reduction over the next week -Continue Atorvastatin 40 mg outpatient -Echocardiogram pending -Hgb A1C pending -Will need close follow up outpatient. Lives in [place] will set up appointment there.  HTN: Patient was admitted for hypertensive emergency 6 months ago and started on Lisinopril-HCTZ 10-12.5 mg. He states that he has not taken his medication for the last month as he is too busy with work. Patient had BP around 220/110 per EMS. He was  given Diltiazem 18 mg once and Labetalol 10 mg once in the ED with a drop in BP to 148/90. -Permissive HTN <220/110 for 24-48 hours with gradual reduction over the next week -On discharge will continue Lisinopril-HCTZ 10-12.5 mg once daily -Will need to follow up outpatient   Dispo: Disposition is deferred at this time, awaiting improvement of current medical problems.  Anticipated discharge in approximately today(s).   The patient does not have a current PCP (No primary care provider on file.) and does not need an [facility] hospital follow-up appointment after discharge.  The patient does not have transportation limitations that hinder transportation to clinic appointments.  .Services Needed at time of discharge: Y = Yes, Blank = No PT:   OT:   RN:   Equipment:   Other:       Darreld Mclean, MD 04/21/2015, 9:32 AM

## 2015-04-21 NOTE — Evaluation (Signed)
Occupational Therapy Evaluation Patient Details Name: Thomas [name] MRN: [id number] DOB: 1946/05/16 Today's Date: 04/21/2015    History of Present Illness Pt is a 69 y/o M admitted on 04/20/15 w/ Rt facial droop, slurred speech, and difficulty opening a juice bottle w/ Lt hand.  Pt's PMH includes HTN.   Clinical Impression   Patient evaluated by Occupational Therapy with no further acute OT needs identified. All education has been completed and the patient has no further questions. See below for any follow-up Occupational Therapy or equipment needs. OT to sign off. Thank you for referral.      Follow Up Recommendations  No OT follow up    Equipment Recommendations  None recommended by OT    Recommendations for Other Services       Precautions / Restrictions Precautions Precautions: Fall Precaution Comments: Pt reports tripping a few days ago w/ his Rt foot on an object on the floor but was able to catch himself.  Denies any falls in the past year. Restrictions Weight Bearing Restrictions: No      Mobility Bed Mobility Overal bed mobility: Independent             General bed mobility comments: No physical assist and no cues needed  Transfers Overall transfer level: Independent Equipment used: None             General transfer comment: No physical assist or cues needed    Balance Overall balance assessment: Independent                               Standardized Balance Assessment Standardized Balance Assessment : Dynamic Gait Index   Dynamic Gait Index Level Surface: Mild Impairment (slower gait speed) Change in Gait Speed: Normal Gait with Horizontal Head Turns: Normal Gait with Vertical Head Turns: Normal Gait and Pivot Turn: Normal Step Over Obstacle: Normal Step Around Obstacles: Normal      ADL Overall ADL's : Independent                                       General ADL Comments: Pt reports "i see what they  are talking about now" Pt staring at face in the mirror for the first time. Pt studying face in detail .      Vision     Perception     Praxis      Pertinent Vitals/Pain Pain Assessment: No/denies pain     Hand Dominance Right   Extremity/Trunk Assessment Upper Extremity Assessment Upper Extremity Assessment: Overall WFL for tasks assessed   Lower Extremity Assessment Lower Extremity Assessment: Overall WFL for tasks assessed   Cervical / Trunk Assessment Cervical / Trunk Assessment: Normal   Communication Communication Communication: Other (comment)   Cognition Arousal/Alertness: Awake/alert Behavior During Therapy: Flat affect Overall Cognitive Status: Within Functional Limits for tasks assessed                     General Comments       Exercises       Shoulder Instructions      Home Living Family/patient expects to be discharged to:: Private residence Living Arrangements: Alone Available Help at Discharge: Other (Comment) Type of Home: House Home Access: Level entry     Home Layout: One level     Bathroom Shower/Tub: Tub/shower unit   [employer]  Toilet: Standard     Home Equipment: None   Additional Comments: He says he will not have assist from family, friends, neighbors.  However has a co-worker who would be able to assist pt at home prn.      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 and uses a machine requiring placement of paper into it repeatedly    OT Diagnosis: Generalized weakness   OT Problem List:     OT Treatment/Interventions:      OT Goals(Current goals can be found in the care plan section) Acute Rehab OT Goals Patient Stated Goal: to get back to work  OT Frequency:     Barriers to D/C:            Co-evaluation              End of  Session Equipment Utilized During Treatment: Gait belt Nurse Communication: Mobility status;Precautions  Activity Tolerance: Patient tolerated treatment well Patient left: in chair;with call bell/phone within reach   Time: 1040-1107 OT Time Calculation (min): 27 min Charges:  OT General Charges $OT Visit: 1 Procedure OT Evaluation $Initial OT Evaluation Tier I: 1 Procedure OT Treatments $Self Care/Home Management : 8-22 mins G-Codes: OT G-codes **NOT FOR INPATIENT CLASS** Functional Assessment Tool Used: clinical judgement Functional Limitation: Self care Self Care Current Status (W0981): At least 1 percent but less than 20 percent impaired, limited or restricted Self Care Goal Status (X9147): At least 1 percent but less than 20 percent impaired, limited or restricted Self Care Discharge Status [phone]): At least 1 percent but less than 20 percent impaired, limited or restricted  [name] 04/21/2015, 11:14 AM   [name]   OTR/L Pager: [phone] Office: [phone] .

## 2015-04-21 NOTE — Evaluation (Signed)
Physical Therapy Evaluation/Discharge Patient Details Name: Thomas [name] MRN: [id number] DOB: 1946/01/07 Today's Date: 04/21/2015   History of Present Illness  Pt is a 69 y/o M admitted on 04/20/15 w/ Rt facial droop, slurred speech, and difficulty opening a juice bottle w/ Lt hand.  Pt's PMH includes HTN.  Clinical Impression  Pt admitted with above diagnosis. Pt currently with functional limitations due to the deficits listed below (see PT Problem List). Thomas [name] demonstrated ability to safely ambulate in hallway at Independent level and completed all transfers independently as well.  No mobility deficits noted, no further PT needed at this time.  PT is signing off.    Follow Up Recommendations No PT follow up    Equipment Recommendations  None recommended by PT    Recommendations for Other Services OT consult;Speech consult     Precautions / Restrictions Precautions Precautions: Fall Precaution Comments: Pt reports tripping a few days ago w/ his Rt foot on an object on the floor but was able to catch himself.  Denies any falls in the past year. Restrictions Weight Bearing Restrictions: No      Mobility  Bed Mobility Overal bed mobility: Independent             General bed mobility comments: No physical assist and no cues needed  Transfers Overall transfer level: Independent Equipment used: None             General transfer comment: No physical assist or cues needed  Ambulation/Gait Ambulation/Gait assistance: Independent Ambulation Distance (Feet): 300 Feet Assistive device: None Gait Pattern/deviations: Step-through pattern;Decreased dorsiflexion - right;Decreased dorsiflexion - left;Decreased stride length   Gait velocity interpretation: Below normal speed for age/gender General Gait Details: Pt demonstrates dec DF Bil; however pt able to perform appropriate foot clearance  Stairs            Wheelchair Mobility    Modified Rankin (Stroke  Patients Only) Modified Rankin (Stroke Patients Only) Pre-Morbid Rankin Score: No symptoms Modified Rankin: No significant disability     Balance Overall balance assessment: Independent                               Standardized Balance Assessment Standardized Balance Assessment : Dynamic Gait Index   Dynamic Gait Index Level Surface: Mild Impairment (slower gait speed) Change in Gait Speed: Normal Gait with Horizontal Head Turns: Normal Gait with Vertical Head Turns: Normal Gait and Pivot Turn: Normal Step Over Obstacle: Normal Step Around Obstacles: Normal       Pertinent Vitals/Pain Pain Assessment: No/denies pain    Home Living Family/patient expects to be discharged to:: Private residence Living Arrangements: Alone Available Help at Discharge: Other (Comment) Type of Home: House Home Access: Level entry     Home Layout: One level Home Equipment: None Additional Comments: He says he will not have assist from family, friends, neighbors.  However has a co-worker who would be able to assist pt at home prn.    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 and uses a machine requiring placement of paper into it repeatedly     Hand Dominance   Dominant Hand: Right    Extremity/Trunk Assessment   Upper Extremity Assessment: Overall WFL for tasks assessed  Lower Extremity Assessment: Overall WFL for tasks assessed (5/5 Rt LE and 4+/5 Lt LE throughout)      Cervical / Trunk Assessment: Normal  Communication   Communication: Other (comment) (some mumbling, difficult to hear)  Cognition Arousal/Alertness: Awake/alert Behavior During Therapy: Flat affect Overall Cognitive Status: Within Functional Limits for tasks assessed                      General Comments General  comments (skin integrity, edema, etc.): Spoke w/ pt about signs and symptoms of acute stroke and importance of seeking medical attention immediately upon onset    Exercises        Assessment/Plan    PT Assessment Patent does not need any further PT services  PT Diagnosis Abnormality of gait;Hemiplegia non-dominant side   PT Problem List    PT Treatment Interventions     PT Goals (Current goals can be found in the Care Plan section) Acute Rehab PT Goals Patient Stated Goal: to get back to work PT Goal Formulation: All assessment and education complete, DC therapy    Frequency     Barriers to discharge        Co-evaluation               End of Session Equipment Utilized During Treatment: Gait belt Activity Tolerance: Patient tolerated treatment well Patient left: in bed;with call bell/phone within reach;with nursing/sitter in room Nurse Communication: Mobility status;Precautions    Functional Assessment Tool Used: Clinical Judgement Functional Limitation: Carrying, moving and handling objects Carrying, Moving and Handling Objects Current Status (Z6109): At least 1 percent but less than 20 percent impaired, limited or restricted Carrying, Moving and Handling Objects Goal Status [phone]): At least 1 percent but less than 20 percent impaired, limited or restricted Carrying, Moving and Handling Objects Discharge Status [phone]): At least 1 percent but less than 20 percent impaired, limited or restricted    Time: 0933-0955 PT Time Calculation (min) (ACUTE ONLY): 22 min   Charges:   PT Evaluation $Initial PT Evaluation Tier I: 1 Procedure     PT G Codes:   PT G-Codes **NOT FOR INPATIENT CLASS** Functional Assessment Tool Used: Clinical Judgement Functional Limitation: Carrying, moving and handling objects Carrying, Moving and Handling Objects Current Status ([id number]): At least 1 percent but less than 20 percent impaired, limited or restricted Carrying, Moving and  Handling Objects Goal Status [phone]): At least 1 percent but less than 20 percent impaired, limited or restricted Carrying, Moving and Handling Objects Discharge Status [phone]): At least 1 percent but less than 20 percent impaired, limited or restricted   Michail Jewels PT, DPT [phone] Pager: [phone] 04/21/2015, 10:26 AM

## 2015-04-22 ENCOUNTER — Encounter (HOSPITAL_COMMUNITY): Payer: Self-pay | Admitting: Internal Medicine

## 2015-04-22 ENCOUNTER — Inpatient Hospital Stay (HOSPITAL_COMMUNITY): Payer: Medicare Other

## 2015-04-22 DIAGNOSIS — I6789 Other cerebrovascular disease: Secondary | ICD-10-CM

## 2015-04-22 DIAGNOSIS — I77810 Thoracic aortic ectasia: Secondary | ICD-10-CM | POA: Diagnosis present

## 2015-04-22 DIAGNOSIS — E049 Nontoxic goiter, unspecified: Secondary | ICD-10-CM

## 2015-04-22 HISTORY — DX: Thoracic aortic ectasia: I77.810

## 2015-04-22 HISTORY — DX: Nontoxic goiter, unspecified: E04.9

## 2015-04-22 LAB — HIV ANTIBODY (ROUTINE TESTING W REFLEX)

## 2015-04-22 LAB — HIV 1/2 AB DIFFERENTIATION
HIV 1 AB: POSITIVE — AB
HIV 2 AB: NEGATIVE

## 2015-04-22 LAB — HEMOGLOBIN A1C
Hgb A1c MFr Bld: 6 % — ABNORMAL HIGH (ref 4.8–5.6)
MEAN PLASMA GLUCOSE: 126 mg/dL

## 2015-04-22 MED ORDER — HYDROCHLOROTHIAZIDE 12.5 MG PO CAPS
12.5000 mg | ORAL_CAPSULE | Freq: Every day | ORAL | Status: DC
  Administered 2015-04-22: 12.5 mg via ORAL
  Filled 2015-04-22: qty 1

## 2015-04-22 MED ORDER — LISINOPRIL 10 MG PO TABS
10.0000 mg | ORAL_TABLET | Freq: Every day | ORAL | Status: DC
  Administered 2015-04-22: 10 mg via ORAL
  Filled 2015-04-22: qty 1

## 2015-04-22 NOTE — Progress Notes (Signed)
Subjective: Feels strength is improving.  No clear plans of where he is going to follow up, says he will ask neighbors for recommendations. Objective: Vital signs in last 24 hours: Filed Vitals:   04/21/15 2124 04/22/15 0104 04/22/15 0533 04/22/15 0921  BP: 179/95 184/113 182/100 191/99  Pulse: 60 69 59 68  Temp: 98.4 F (36.9 C) 98.2 F (36.8 C) 98.4 F (36.9 C) 98.1 F (36.7 C)  TempSrc: Oral Oral Oral Oral  Resp: 16 16 16 16   Height:      Weight:      SpO2: 96% 98% 97% 99%   Weight change:  No intake or output data in the 24 hours ending 04/22/15 0945   General: resting in bed NAD HEENT: PERRL, EOMI, no scleral icterus Cardiac: RRR, diastolic murmur Pulm: clear to auscultation bilaterally Abd: soft, nontender, nondistended, BS present Ext: warm and well perfused, no pedal edema Neuro: alert and oriented X3, mild asymmetry with smile (right side weakness) but fully symmetry of forhead  Lab Results: Basic Metabolic Panel:  Recent Labs Lab 04/20/15 1443 04/20/15 1448  NA 138 139  K 3.5 4.0  CL 106 105  CO2 24  --   GLUCOSE 95 95  BUN 9 14  CREATININE 1.16 1.10  CALCIUM 9.0  --    Liver Function Tests:  Recent Labs Lab 04/20/15 1443  AST 23  ALT 11*  ALKPHOS 119  BILITOT 0.5  PROT 8.1  ALBUMIN 3.5   No results for input(s): LIPASE, AMYLASE in the last 168 hours. No results for input(s): AMMONIA in the last 168 hours. CBC:  Recent Labs Lab 04/20/15 1443 04/20/15 1448 04/20/15 2115  WBC 3.7*  --  3.4*  NEUTROABS 1.1*  --   --   HGB 13.5 15.0 12.9*  HCT 40.8 44.0 38.8*  MCV 84.5  --  83.6  PLT 82*  --  91*   Cardiac Enzymes: No results for input(s): CKTOTAL, CKMB, CKMBINDEX, TROPONINI in the last 168 hours. BNP: No results for input(s): PROBNP in the last 168 hours. D-Dimer: No results for input(s): DDIMER in the last 168 hours. CBG: No results for input(s): GLUCAP in the last 168 hours. Hemoglobin A1C:  Recent Labs Lab  04/21/15 0515  HGBA1C 6.0*   Fasting Lipid Panel:  Recent Labs Lab 04/21/15 0515  CHOL 115  HDL 29*  LDLCALC 79  TRIG 36  CHOLHDL 4.0   Thyroid Function Tests:  Recent Labs Lab 04/20/15 2115  TSH 1.496   Coagulation:  Recent Labs Lab 04/20/15 1443  LABPROT 13.8  INR 1.04   Anemia Panel: No results for input(s): VITAMINB12, FOLATE, FERRITIN, TIBC, IRON, RETICCTPCT in the last 168 hours. Urine Drug Screen: Drugs of Abuse     Component Value Date/Time   LABOPIA NONE DETECTED 04/20/2015 1534   COCAINSCRNUR NONE DETECTED 04/20/2015 1534   LABBENZ NONE DETECTED 04/20/2015 1534   AMPHETMU NONE DETECTED 04/20/2015 1534   THCU NONE DETECTED 04/20/2015 1534   LABBARB NONE DETECTED 04/20/2015 1534    Alcohol Level:  Recent Labs Lab 04/20/15 1446  ETH <5   Urinalysis:  Recent Labs Lab 04/20/15 1534  COLORURINE YELLOW  LABSPEC 1.011  PHURINE 6.0  GLUCOSEU NEGATIVE  HGBUR NEGATIVE  BILIRUBINUR NEGATIVE  KETONESUR NEGATIVE  PROTEINUR NEGATIVE  UROBILINOGEN 1.0  NITRITE NEGATIVE  LEUKOCYTESUR NEGATIVE    Micro Results: No results found for this or any previous visit (from the past 240 hour(s)). Studies/Results: Ct Angio Head W/cm &/or Wo  Cm  04/20/2015   CLINICAL DATA:  Slurred speech and right facial droop.  EXAM: CT ANGIOGRAPHY HEAD AND NECK  TECHNIQUE: Multidetector CT imaging of the head and neck was performed using the standard protocol during bolus administration of intravenous contrast. Multiplanar CT image reconstructions and MIPs were obtained to evaluate the vascular anatomy. Carotid stenosis measurements (when applicable) are obtained utilizing NASCET criteria, using the distal internal carotid diameter as the denominator.  CONTRAST:  80mL OMNIPAQUE IOHEXOL 350 MG/ML SOLN  COMPARISON:  Noncontrast head CT earlier today  FINDINGS: CT HEAD  Brain: Hypodensities in the cerebral white matter which are nonspecific but compatible with chronic small vessel  ischemic disease as described on earlier head CT. Small, chronic white matter infarct in the right frontal lobe. No hydrocephalus. No gross intracranial hemorrhage, mass, midline shift, or extra-axial fluid collection.  Calvarium and skull base: No skull fracture or aggressive osseous lesions.  Paranasal sinuses: Bilateral maxillary sinus mucous retention cysts. Subtotal opacification of the right sphenoid sinus. Clear mastoid air cells.  Orbits: Unremarkable.  CTA NECK  Aortic arch: Ectatic aortic arch measuring 3.8 cm in diameter distally, with the visualized distal ascending aorta measuring 3.7 cm. Common origin of the brachiocephalic and left common carotid arteries, a normal variant. Brachiocephalic and subclavian arteries are widely patent.  Right carotid system: Patent without evidence of stenosis, dissection, or aneurysm. No significant atherosclerosis.  Left carotid system: Patent without evidence of stenosis, dissection, or aneurysm. No significant atherosclerosis.  Vertebral arteries: Patent without stenosis. Right vertebral artery is slightly larger than the left.  Skeleton: Moderate lower cervical disc degeneration.  Other neck: Poor dentition with multiple missing teeth, multiple carious, and multiple periapical lucencies particularly in the left maxillary molar region. Diffusely enlarged thyroid with heterogeneous attenuation including a 2.1 cm nodule on the right. Small mediastinal lymph nodes as well as a mildly enlarged precarinal lymph node measuring 1.3 cm in short axis, nonspecific.  CTA HEAD  Anterior circulation: The internal carotid arteries are patent from skullbase to carotid termini without stenosis or significant atherosclerosis. There is a 4 x 3 mm posteroinferiorly directed outpouching from the left supraclinoid ICA in the posterior communicating artery region consistent with an aneurysm. The right A1 segment is hypoplastic. Anterior communicating artery is patent. ACAs are otherwise  unremarkable without evidence of stenosis. MCAs are patent without evidence of stenosis or major branch vessel occlusion.  Posterior circulation: Intracranial vertebral arteries are patent to the basilar without stenosis. Right PICA origin is patent. Left AICA is dominant and Corrie Dandy share a common origin with the left PICA. Basilar artery is tortuous without stenosis. SCA origins are patent. There is a patent right posterior communicating artery. A left posterior communicating artery is not clearly identified. PCAs are patent without evidence of significant stenosis.  Venous sinuses: Patent.  Anatomic variants: Hypoplastic right A1 segment.  Delayed phase: No abnormal enhancement.  IMPRESSION: 1. No intracranial medium or large vessel occlusion or stenosis. 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 3. No cervical carotid or vertebral artery stenosis. 4. Ectatic aortic arch. Recommend annual imaging followup by CTA or MRA. This recommendation follows 2010 ACCF/AHA/AATS/ACR/ASA/SCA/SCAI/SIR/STS/SVM Guidelines for the Diagnosis and Management of Patients with Thoracic Aortic Disease. Circulation.2010; 121: e266-e369 5. Enlarged thyroid containing multiple nodules measuring up to 2.1 cm in size. Recommend further evaluation with outpatient thyroid ultrasound.  These results were called by telephone at the time of interpretation on 04/20/2015 at 4:39 pm to Dr. Thana Farr , who verbally  acknowledged these results.   Electronically Signed   By: Sebastian Ache   On: 04/20/2015 16:44   Dg Chest 2 View  04/20/2015   CLINICAL DATA:  Code stroke.  EXAM: CHEST  2 VIEW  COMPARISON:  None.  FINDINGS: The heart is upper limits of normal in size. There is tortuosity and mild ectasia of the thoracic aorta. The lungs are clear of acute process. Suspect mild chronic bronchitic changes and bibasilar scarring. No definite infiltrates, edema or effusions. The bony thorax is intact.  IMPRESSION: Borderline cardiac  enlargement and probable mild bronchitic lung changes but no acute pulmonary findings.   Electronically Signed   By: Rudie Meyer M.D.   On: 04/20/2015 20:00   Ct Head Wo Contrast  04/20/2015   CLINICAL DATA:  Right-sided facial droop and slurred speech, acute  EXAM: CT HEAD WITHOUT CONTRAST  TECHNIQUE: Contiguous axial images were obtained from the base of the skull through the vertex without intravenous contrast.  COMPARISON:  None.  FINDINGS: The ventricles are normal in size and configuration. There is no intracranial mass, hemorrhage, extra-axial fluid collection, or midline shift.  There is patchy small vessel disease in the centra semiovale bilaterally. There is evidence of a prior focal infarct adjacent to the frontal horn right lateral ventricle anteriorly. No obvious acute infarct is seen.  There is mild increased attenuation in the proximal left middle cerebral artery compared to the right.  The bony calvarium appears intact. The mastoid air cells are clear. There is mucosal thickening in several ethmoid air cells bilaterally. There is also right-sided sphenoid sinus opacification. Mild mucosal thickening is noted in the left maxillary antrum.  IMPRESSION: Patchy supratentorial small vessel disease.  There is a degree of increased attenuation in a portion of the left middle cerebral artery compared to the right. The significance of this finding is uncertain. This finding potentially could indicate the earliest changes of a left middle cerebral artery distribution infarct.  No hemorrhage or mass effect.  Areas of paranasal sinus disease.  Critical Value/emergent results were called by telephone at the time of interpretation on 04/20/2015 at 3:02 pm to Dr. Thad Ranger, neurology, who verbally acknowledged these results.   Electronically Signed   By: Bretta Bang III M.D.   On: 04/20/2015 15:03   Ct Angio Neck W/cm &/or Wo/cm  04/20/2015   CLINICAL DATA:  Slurred speech and right facial droop.  EXAM:  CT ANGIOGRAPHY HEAD AND NECK  TECHNIQUE: Multidetector CT imaging of the head and neck was performed using the standard protocol during bolus administration of intravenous contrast. Multiplanar CT image reconstructions and MIPs were obtained to evaluate the vascular anatomy. Carotid stenosis measurements (when applicable) are obtained utilizing NASCET criteria, using the distal internal carotid diameter as the denominator.  CONTRAST:  80mL OMNIPAQUE IOHEXOL 350 MG/ML SOLN  COMPARISON:  Noncontrast head CT earlier today  FINDINGS: CT HEAD  Brain: Hypodensities in the cerebral white matter which are nonspecific but compatible with chronic small vessel ischemic disease as described on earlier head CT. Small, chronic white matter infarct in the right frontal lobe. No hydrocephalus. No gross intracranial hemorrhage, mass, midline shift, or extra-axial fluid collection.  Calvarium and skull base: No skull fracture or aggressive osseous lesions.  Paranasal sinuses: Bilateral maxillary sinus mucous retention cysts. Subtotal opacification of the right sphenoid sinus. Clear mastoid air cells.  Orbits: Unremarkable.  CTA NECK  Aortic arch: Ectatic aortic arch measuring 3.8 cm in diameter distally, with the visualized distal ascending aorta  measuring 3.7 cm. Common origin of the brachiocephalic and left common carotid arteries, a normal variant. Brachiocephalic and subclavian arteries are widely patent.  Right carotid system: Patent without evidence of stenosis, dissection, or aneurysm. No significant atherosclerosis.  Left carotid system: Patent without evidence of stenosis, dissection, or aneurysm. No significant atherosclerosis.  Vertebral arteries: Patent without stenosis. Right vertebral artery is slightly larger than the left.  Skeleton: Moderate lower cervical disc degeneration.  Other neck: Poor dentition with multiple missing teeth, multiple carious, and multiple periapical lucencies particularly in the left maxillary  molar region. Diffusely enlarged thyroid with heterogeneous attenuation including a 2.1 cm nodule on the right. Small mediastinal lymph nodes as well as a mildly enlarged precarinal lymph node measuring 1.3 cm in short axis, nonspecific.  CTA HEAD  Anterior circulation: The internal carotid arteries are patent from skullbase to carotid termini without stenosis or significant atherosclerosis. There is a 4 x 3 mm posteroinferiorly directed outpouching from the left supraclinoid ICA in the posterior communicating artery region consistent with an aneurysm. The right A1 segment is hypoplastic. Anterior communicating artery is patent. ACAs are otherwise unremarkable without evidence of stenosis. MCAs are patent without evidence of stenosis or major branch vessel occlusion.  Posterior circulation: Intracranial vertebral arteries are patent to the basilar without stenosis. Right PICA origin is patent. Left AICA is dominant and Corrie Dandy share a common origin with the left PICA. Basilar artery is tortuous without stenosis. SCA origins are patent. There is a patent right posterior communicating artery. A left posterior communicating artery is not clearly identified. PCAs are patent without evidence of significant stenosis.  Venous sinuses: Patent.  Anatomic variants: Hypoplastic right A1 segment.  Delayed phase: No abnormal enhancement.  IMPRESSION: 1. No intracranial medium or large vessel occlusion or stenosis. 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 3. No cervical carotid or vertebral artery stenosis. 4. Ectatic aortic arch. Recommend annual imaging followup by CTA or MRA. This recommendation follows 2010 ACCF/AHA/AATS/ACR/ASA/SCA/SCAI/SIR/STS/SVM Guidelines for the Diagnosis and Management of Patients with Thoracic Aortic Disease. Circulation.2010; 121: e266-e369 5. Enlarged thyroid containing multiple nodules measuring up to 2.1 cm in size. Recommend further evaluation with outpatient thyroid ultrasound.   These results were called by telephone at the time of interpretation on 04/20/2015 at 4:39 pm to Dr. Thana Farr , who verbally acknowledged these results.   Electronically Signed   By: Sebastian Ache   On: 04/20/2015 16:44   Mr Brain Wo Contrast  04/20/2015   CLINICAL DATA:  Patient with known history of hypertension who ran out of medication last month. Last seen normal at 1100 hr earlier today. Presents with subsequent onset of RIGHT facial droop and slurred speech. Current blood pressure reported as 208/100.  EXAM: MRI HEAD WITHOUT CONTRAST  TECHNIQUE: Multiplanar, multiecho pulse sequences of the brain and surrounding structures were obtained without intravenous contrast.  COMPARISON:  CT head, as well as CT angio head and neck, earlier today.  FINDINGS: Moderate-sized area of restricted diffusion affects the LEFT centrum semiovale, perhaps involving the superior portion of the LEFT lentiform nucleus, extending to the periventricular white matter, consistent with an acute LEFT MCA lenticulostriate territory infarct. No hemorrhage.  No associated mass lesion, hydrocephalus, or extra-axial fluid.  Mild atrophy. Moderately advanced small vessel disease. 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 Scattered areas of chronic lacunar infarction, most notable in the RIGHT frontal  subcortical and periventricular white matter. No midline shift. Dolichoectatic but widely patent intracranial vasculature. No midline abnormalities. Extracranial soft tissues unremarkable.  Compared with prior imaging studies earlier today, the acute infarct is not visible.  IMPRESSION: Moderate-sized area of restricted diffusion affects the LEFT centrum semiovale, perhaps involving the superior portion of the LEFT lentiform nucleus, consistent with an acute LEFT MCA  lenticulostriate territory infarct. No hemorrhagic transformation.  No visible large vessel occlusion as correlated with recent CTA and based on intracranial flow voids.  Sequelae of hypertensive cerebral vascular disease, with multiple areas of chronic hemorrhage in the brain as well as moderately advanced small vessel disease.   Electronically Signed   By: Elsie Stain M.D.   On: 04/20/2015 19:38   Medications: I have reviewed the patient's current medications. Scheduled Meds: . aspirin EC  81 mg Oral Daily  . atorvastatin  40 mg Oral q1800  . enoxaparin (LOVENOX) injection  40 mg Subcutaneous Q24H  . hydrochlorothiazide  12.5 mg Oral Daily  . 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 - Aspirin 81 mg -Restarted BP meds today -Continue Atorvastatin 40 mg outpatient -Echocardiogram completed but read pending -Follow up with Dr Roda Shutters in 2 months   HTN:  -Restarted Lisinopril-HCTZ 10-12.5 mg once daily -Will need to follow up outpatient  Ectatic aortic arch.  Recommend annual imaging followup by CTA or MRA -Follow up Echo results  Enlarged thyroid containing multiple nodules measuring up to 2.1 cm in size - TSH wnl - Thyroid U/S with biopsy as outpatinet  Dispo: I am concerned that he doesn't have set plans to follow up in [place].  He does report that he commutes daily to [place] for work, I have told him for now I would at least like him to have a follow up appointment in our clinic to make sure things do not get lost in transition of care.  He will need follow up of his abnormal aorta imaging, Echocardiogram results, Thyroid nodule results, and ongoing  management of his HTN.  The patient does not have a current PCP (No primary care provider on file.) and does not need an [facility] hospital follow-up appointment after discharge.  The patient does not have transportation limitations that hinder transportation to clinic appointments.  .Services Needed at time of discharge: Y = Yes, Blank = No PT:   OT:   RN:   Equipment:   Other:     LOS: 1 day   Gust Rung, DO 04/22/2015, 9:45 AM

## 2015-04-22 NOTE — Progress Notes (Signed)
STROKE TEAM PROGRESS NOTE   HISTORY Thomas [name] is an 69 y.o. male with known history of HTN on Lisinopril. He has been out of his medication for the past month. He was last seen normal at 1100 hours when speaking with a co-worker. Later in the day when spoken to again by a coworker it was noted that he had a right facial droop and slurred speech. The patient had not noted the change prior to coworker noting his facial droop. EMS was called at that time and the patient was brought in for evaluation. Initial NIHSS of 1 for right facial droop.   Date last known well: Date: 04/20/2015 Time last known well: Time: 11:00 tPA Given: No: minimal symptoms MRankin: 0   SUBJECTIVE (INTERVAL HISTORY) No family members present. The patient feels back to baseline except for some mild facial weakness. He has met with the research study nurse and has agreed to participate in the stroke AF trial. He will follow-up with Dr. Erlinda Hong in 2 months. Other follow-up arrangements per the teaching service. 2-D echo is still pending.  OBJECTIVE Temp:  [98.1 F (36.7 C)-98.9 F (37.2 C)] 98.1 F (36.7 C) (08/20 0921) Pulse Rate:  [59-77] 68 (08/20 0921) Cardiac Rhythm:  [-] Normal sinus rhythm (08/19 2019) Resp:  [16] 16 (08/20 0921) BP: (174-204)/(95-113) 191/99 mmHg (08/20 0921) SpO2:  [96 %-99 %] 99 % (08/20 0921)  No results for input(s): GLUCAP in the last 168 hours.  Recent Labs Lab 04/20/15 1443 04/20/15 1448  NA 138 139  K 3.5 4.0  CL 106 105  CO2 24  --   GLUCOSE 95 95  BUN 9 14  CREATININE 1.16 1.10  CALCIUM 9.0  --     Recent Labs Lab 04/20/15 1443  AST 23  ALT 11*  ALKPHOS 119  BILITOT 0.5  PROT 8.1  ALBUMIN 3.5    Recent Labs Lab 04/20/15 1443 04/20/15 1448 04/20/15 2115  WBC 3.7*  --  3.4*  NEUTROABS 1.1*  --   --   HGB 13.5 15.0 12.9*  HCT 40.8 44.0 38.8*  MCV 84.5  --  83.6  PLT 82*  --  91*   No results for input(s): CKTOTAL, CKMB, CKMBINDEX, TROPONINI in the  last 168 hours.  Recent Labs  04/20/15 1443  LABPROT 13.8  INR 1.04    Recent Labs  04/20/15 1534  COLORURINE YELLOW  LABSPEC 1.011  PHURINE 6.0  GLUCOSEU NEGATIVE  HGBUR NEGATIVE  BILIRUBINUR NEGATIVE  KETONESUR NEGATIVE  PROTEINUR NEGATIVE  UROBILINOGEN 1.0  NITRITE NEGATIVE  LEUKOCYTESUR NEGATIVE       Component Value Date/Time   CHOL 115 04/21/2015 0515   TRIG 36 04/21/2015 0515   HDL 29* 04/21/2015 0515   CHOLHDL 4.0 04/21/2015 0515   VLDL 7 04/21/2015 0515   LDLCALC 79 04/21/2015 0515   Lab Results  Component Value Date   HGBA1C 6.0* 04/21/2015      Component Value Date/Time   LABOPIA NONE DETECTED 04/20/2015 1534   COCAINSCRNUR NONE DETECTED 04/20/2015 1534   LABBENZ NONE DETECTED 04/20/2015 1534   AMPHETMU NONE DETECTED 04/20/2015 1534   THCU NONE DETECTED 04/20/2015 1534   LABBARB NONE DETECTED 04/20/2015 1534     Recent Labs Lab 04/20/15 1446  ETH <5     IMAGING I have personally reviewed the radiological images below and agree with the radiology interpretations.  Ct Angio Head W/cm &/or Wo Cm 04/20/2015    1. No intracranial medium or large vessel  occlusion or stenosis.  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  3. No cervical carotid or vertebral artery stenosis.  4. Ectatic aortic arch. Recommend annual imaging followup by CTA or MRA.  This recommendation follows 2010 ACCF/AHA/AATS/ACR/ASA/SCA/SCAI/SIR/STS/SVM Guidelines for the Diagnosis and Management of Patients with Thoracic Aortic Disease. Circulation.2010; 121: e266-e369 5.   Enlarged thyroid containing multiple nodules measuring up to 2.1 cm in size. Recommend further evaluation with outpatient thyroid ultrasound.    Dg Chest 2 View 04/20/2015    Borderline cardiac enlargement and probable mild bronchitic lung changes but no acute pulmonary findings.     Ct Head Wo Contrast 04/20/2015    Patchy supratentorial small vessel disease.   There is a degree of  increased attenuation in a portion of the left middle cerebral artery compared to the right.  The significance of this finding is uncertain.  This finding potentially could indicate the earliest changes of a left middle cerebral artery distribution infarct.   No hemorrhage or mass effect.   Areas of paranasal sinus disease.    Mr Brain Wo Contrast 04/20/2015    Moderate-sized area of restricted diffusion affects the LEFT centrum semiovale, perhaps involving the superior portion of the LEFT lentiform nucleus, consistent with an acute LEFT MCA lenticulostriate territory infarct.  No hemorrhagic transformation.   No visible large vessel occlusion as correlated with recent CTA and based on intracranial flow voids.   Sequelae of hypertensive cerebral vascular disease, with multiple areas of chronic hemorrhage in the brain as well as moderately advanced small vessel disease.     2D echo - done result pending   PHYSICAL EXAM  Temp:  [98.1 F (36.7 C)-98.9 F (37.2 C)] 98.1 F (36.7 C) (08/20 0921) Pulse Rate:  [59-77] 68 (08/20 0921) Resp:  [16] 16 (08/20 0921) BP: (174-204)/(95-113) 191/99 mmHg (08/20 0921) SpO2:  [96 %-99 %] 99 % (08/20 0921)  General - Well nourished, well developed, in no apparent distress.  Ophthalmologic - Sharp disc margins OU.   Cardiovascular - Regular rate and rhythm with no murmur.  Mental Status -  Level of arousal and orientation to time, place, and person were intact. Language including expression, naming, repetition, comprehension was assessed and found intact. Soft spoken.  Fund of Knowledge was assessed and was intact.  Cranial Nerves II - XII - II - Visual field intact OU. III, IV, VI - Extraocular movements intact. V - Facial sensation intact bilaterally. VII - right lower facial weakness. VIII - Hearing & vestibular intact bilaterally. X - Palate elevates symmetrically, mild dysarthria. XI - Chin turning & shoulder shrug intact  bilaterally XII - Tongue protrusion intact.  Motor Strength - The patient's strength was normal in all extremities and pronator drift was absent.  Bulk was normal and fasciculations were absent.   Motor Tone - Muscle tone was assessed at the neck and appendages and was normal.  Reflexes - The patient's reflexes were 1+ in all extremities and he had no pathological reflexes.  Sensory - Light touch, temperature/pinprick were assessed and were symmetrical.    Coordination - The patient had normal movements in the hands and feet with no ataxia or dysmetria.  Tremor was absent.  Gait and Station - deferred due to safety concerns.   ASSESSMENT/PLAN [name] is a 69 y.o. male with history of hypertension, presenting with right facial droop and slurred speech.  He did not receive IV t-PA due to minimal deficits.   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   CTA head and neck - no large vessel occlusion, left 43m PCOM aneurysm. Outpatient follow-up.  2D Echo  pending  LDL 79  HgbA1c 6.0  Lovenox for VTE prophylaxis Diet Heart Room service appropriate?: Yes; Fluid consistency:: Thin Diet - low sodium heart healthy  no antithrombotic prior to admission, now on aspirin 81 mg orally every day.   Patient counseled to be compliant with his antithrombotic medications  Ongoing aggressive stroke risk factor management  Therapy recommendations: No follow-up occupational or physical therapies recommended.  Disposition: home  Hypertension, malignant   Home meds: Lisinopril / hydrochlorothiazide  Blood pressure running high at 180-200, goal BP gradually down to 120-140.   On home meds now  Patient counseled to be compliant with his blood pressure medications  Hyperlipidemia  Home meds:  No lipid lowering medications prior to admission  LDL 79, goal <  70  Now on Lipitor 40 mg daily  Continue statin at discharge  Other Stroke Risk Factors  Advanced age  Other Active Problems  Pancytopenia - wbc's 3.4 thousand; platelets 91,000; hemoglobin 12.9  Possible Stroke AF trial candidate.  Other Pertinent History    PLAN  Outpatient follow-up with Dr. XErlinda Hongin 2 months - order placed  Patient has agreed to participate in the stroke AF trial. Research nurse has instructed patient and obtained consent.  Outpatient monitoring of 4 mm left P-comm aneurysm per Dr. XErlinda Hong  Further follow-up of thyroid nodules - outpatient  Ectatic aortic arch. Recommend annual imaging followup by CTA or MRA primary M.D.  Blood pressure control  Patient does not have a primary care provider. [place] The teaching service will arrange follow-up as per my conversation with Dr. HLydia Guilesday # 1EarlyPA-C [facility] Pager ([phone]/20/2016, 10:47 AM  I, the attending vascular neurologist, have personally obtained a history, examined the patient, evaluated laboratory data, individually viewed imaging studies and agree with radiology interpretations. I also discussed with pt regarding his care plan. Together with the NP/PA, we formulated the assessment and plan of care which reflects our mutual decision.  I have made any additions or clarifications directly to the above note and agree with the findings and plan as currently documented.   68yo M with hx of uncontrolled HTN noncompliant with medication admitted for left BG and CR infarct, likely due to small vessel disease. BP still high even with home meds resumed. He needs close follow up after discharge for BP control. Stroke work up with CTA head and neck showed no large vessel cut off but left PCOM [id number], MRI showed evidence of chronic HTN. LDL 79 and he was put on ASA and statin. 2D echo pending. No PT/OT needs. Candidate  for stroke AF trial.   Neurology will sign off. Please call with questions. [facility]. Thanks for the consult.  JiRosalin HawkingMD PhD Stroke Neurology 04/22/2015 11:21 AM    To contact Stroke Continuity provider, please refer to [email or web address], contact General Neurology

## 2015-04-22 NOTE — Progress Notes (Signed)
  Echocardiogram 2D Echocardiogram has been performed.  Arvil Chaco 04/22/2015, 10:50 AM

## 2015-04-22 NOTE — Progress Notes (Signed)
Internal Medicine Attending:   I saw and examined the patient. I reviewed the resident's note and I agree with the resident's findings and plan as documented in the resident's note.  69 year old man now hospital day 2 after a imaging confirmed acute left MCA infarct. On exam today he is doing very well, vitals are stable, neuro exam is improved with normal strength in the upper or lower extremities, and near normal facial symmetry. Patient is tolerating evidence-based therapy well with aspirin, atorvastatin, and his home blood pressure regimen. Echocardiogram completed today, I anticipate this will show some amount of aortic regurgitation. We will plan to follow this up in the clinic, likely will need annual imaging. CT of the neck also showed a 2 cm thyroid nodule, and on exam there is some cervical adenopathy as well. Based on this I would recommend ultrasound guided FNA of the thyroid nodule as an outpatient. We'll follow-up those results in our clinic as well.

## 2015-04-22 NOTE — Progress Notes (Signed)
Patient discharged home by taxi service. Patient refused voucher for bus. MD aware of patient's discharge bp, stated to continue with discharge. Patient agreed to [facility], consent signed and faxed. Neuro assessment unchanged. IV removed, tele removed. Patient vocalized understanding of discharge instructions including follow up appointments and medications.

## 2015-04-24 ENCOUNTER — Telehealth: Payer: Self-pay | Admitting: Internal Medicine

## 2015-04-24 DIAGNOSIS — B20 Human immunodeficiency virus [HIV] disease: Secondary | ICD-10-CM | POA: Insufficient documentation

## 2015-04-24 NOTE — Telephone Encounter (Signed)
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  I let him know the results and that he will be contacted soon by the [facility] about getting him in for further testing.  He reported that he understood.  Gust Rung, DO

## 2015-04-26 ENCOUNTER — Ambulatory Visit (INDEPENDENT_AMBULATORY_CARE_PROVIDER_SITE_OTHER): Payer: Non-veteran care | Admitting: Internal Medicine

## 2015-04-26 ENCOUNTER — Encounter: Payer: Self-pay | Admitting: Internal Medicine

## 2015-04-26 VITALS — BP 173/106 | HR 62 | Temp 98.0°F | Ht 69.0 in | Wt 149.1 lb

## 2015-04-26 DIAGNOSIS — Z21 Asymptomatic human immunodeficiency virus [HIV] infection status: Secondary | ICD-10-CM

## 2015-04-26 DIAGNOSIS — I69328 Other speech and language deficits following cerebral infarction: Secondary | ICD-10-CM | POA: Diagnosis not present

## 2015-04-26 DIAGNOSIS — E049 Nontoxic goiter, unspecified: Secondary | ICD-10-CM

## 2015-04-26 DIAGNOSIS — I69351 Hemiplegia and hemiparesis following cerebral infarction affecting right dominant side: Secondary | ICD-10-CM

## 2015-04-26 DIAGNOSIS — I10 Essential (primary) hypertension: Secondary | ICD-10-CM | POA: Diagnosis not present

## 2015-04-26 DIAGNOSIS — B20 Human immunodeficiency virus [HIV] disease: Secondary | ICD-10-CM

## 2015-04-26 DIAGNOSIS — I63312 Cerebral infarction due to thrombosis of left middle cerebral artery: Secondary | ICD-10-CM

## 2015-04-26 MED ORDER — LISINOPRIL-HYDROCHLOROTHIAZIDE 20-12.5 MG PO TABS: 1.0000 | ORAL_TABLET | Freq: Every day | ORAL | Status: DC

## 2015-04-26 NOTE — Assessment & Plan Note (Signed)
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 Patient states that he has not been sexually active for many years and denies any IV drug use. He has never smoked or use alcohol. Patient has no known history of HIV prior to test. 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 -Patient has been contacted by [facility] in [place] and told to make appointment when his insurance forms are approved, which I filled out today.

## 2015-04-26 NOTE — Patient Instructions (Signed)
Thank you for your visit.  We will increase your blood pressure medication Lisinopril-Hydrochlorothiazide to 20-12.5 mg once a day. I will sent this prescription to your pharmacy.  I will refer you for Ultrasound of your thyroid with biopsy.  We will do two more blood tests today to confirm your illness and check for another illness.  Please also follow up with Dr. Roda Shutters neurology in 2 months.  I have filled out the paperwork your work has given to you. Please let me know if there are any issues. Try to follow up with the Infectious Disease clinic as soon as you are able to.

## 2015-04-26 NOTE — Assessment & Plan Note (Signed)
BP Readings from Last 3 Encounters:  04/26/15 173/106  04/22/15 182/106  10/16/14 155/85   Patient has history of hypertensive emergency w/ hospitalizaton in Feb. 2016 and was recently discharged form hospital after CVA with blood pressures up to 220/110. He was started on Lisinopril-HCTZ 10-12.5 mg daily after hospitalization in 10/2014 and was taking medication until a month prior to last admission after his prescription ran out. It is likely this contributed to his CVA. He was restarted on previous medication after discharge. His BP continues to be elevated with 2 readings at 187/103 and 173/106 today. He states that he has been compliant with daily medications. -Will increase Lisinopril-HCTZ to 20-12.5 mg and follow up in 2 weeks.

## 2015-04-26 NOTE — Assessment & Plan Note (Signed)
Patient found to have enlarged thyroid w/ multiple nodules measuring up to 2.1 cm in size on CTA 04/21/2015 during stroke workup. TSH on 8/18 WNL at 1.496. He denies any neck pain, difficulty swallowing, palpitations, heat or cold intolerance, changes in weight, or other concerning symptoms . -Will refer for thyroid ultrasound and biopsy

## 2015-04-26 NOTE — Assessment & Plan Note (Signed)
Patient presented to ED on 04/20/2015 when coworkers noticed he had slurred speech, slight right sided facial drooping, and some right hand weakness. He was found to have a CVA of the left MCA in the lenticulostriate territory. He did not receive tPA as his NIHSS was 1. Patient's strength and sensation remained intact other than above findings and he was discharged without need for PT/OT. During workup he was found to have an ectatic aortic arch with recommendation for annual imaging by CTA or MRA. His Echo showed an EF of 50-55% and mild aortic regurgitation. Patient states that he believes his speech and right hand weakness have somewhat improved. He does admit that he takes longer with his activities of daily living such as cleaning, bathing, and dressing himself. He does notice that he occasionally feels like he loses balance towards his right, but denies any falls. Patient's strength and sensation is intact bilaterally with normal DTRs. 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  -Work forms filled, will advise not to return to work until he is reassessed in 2 weeks -Annual CTA/MRA for ectatic aortic arch with next imaging in 04/2016 -Continue ASA 81 mg, Atorvastatin 40 mg both daily -Increase Lisinopril-HCTZ to 20-12.5 mg qd -Patient to follow up with Dr. Roda Shutters in 2 months -Patient has agreed to enroll in Stroke A. Fib trial during hospitalization

## 2015-04-26 NOTE — Progress Notes (Signed)
Patient ID: Thomas [name], male   DOB: [birth year], 69 y.o.   MRN: [id number]   Subjective:   Patient ID: Thomas [name] male   DOB: 1946-07-04 69 y.o.   MRN: [id number]  HPI: Thomas [name] is a 69 y.o. male with PMH of HTN and recent CVA who presents for a follow up after hospital visit in which he was diagnosed with a CVA of the left MCA in the lenticulostriate area. He was also found to have an enlarged thyroid with multiple nodules during imaging workup. Patient was also tested for HIV and had a reactive test to the HIV 1/2 antibody differential.  Please see problem list for details.    Past Medical History  Diagnosis Date  . HTN (hypertension)   . Enlarged thyroid 04/22/2015    Seen on CTA 04/21/15: Contains multiple nodules measuring up to 2.1 cm in size, TSH wnl.  Needs thyroid ultrasound and biopsy  . 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  Recommend 1 year follow up with CT or MRI.   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 aspirin EC 81 MG EC tablet Take 1 tablet (81 mg total) by mouth daily. 30 tablet 3  . atorvastatin (LIPITOR) 40 MG tablet Take 1 tablet (40 mg total) by mouth daily at 6 PM. 30 tablet 3  . lisinopril-hydrochlorothiazide (PRINZIDE,ZESTORETIC) 20-12.5 MG per tablet Take 1 tablet by mouth daily. 30 tablet 11   No current facility-administered medications for this visit.   Family History  Problem Relation Age of Onset  . Hypertension Mother   . Hyperlipidemia Mother    Social History   Social History  . Marital Status: Single    Spouse Name: N/A  . Number of Children: N/A  . Years of Education: N/A   Social History Main Topics  . Smoking status: Never Smoker   . Smokeless tobacco: None  . Alcohol Use: No  . Drug Use: No  . Sexual Activity: Not Asked   Other Topics Concern  . None   Social History Narrative   Review  of Systems: Review of Systems  Constitutional: Negative for fever, chills and weight loss.  HENT: Negative for ear pain and sore throat.   Eyes: Negative for blurred vision.  Respiratory: Negative for cough and shortness of breath.   Cardiovascular: Negative for chest pain, palpitations and leg swelling.  Gastrointestinal: Negative for nausea, vomiting, abdominal pain, diarrhea and constipation.  Genitourinary: Negative for dysuria.  Musculoskeletal: Negative for myalgias, joint pain and falls.  Skin: Negative for rash.  Neurological: Positive for speech change and focal weakness. Negative for dizziness, tingling, sensory change, seizures and headaches.  Psychiatric/Behavioral: Negative for depression.    Objective:  Physical Exam: Filed Vitals:   04/26/15 1422 04/26/15 1448  BP: 187/103 173/106  Pulse: 62   Temp: 98 F (36.7 C)   TempSrc: Oral   Height: 5\' 9"  (1.753 m)   Weight: 149 lb 1.6 oz (67.631 kg)   SpO2: 100%    Physical Exam  Constitutional: He is oriented to person, place, and time. He appears well-developed and well-nourished.  HENT:  Head: Normocephalic and atraumatic.  Mouth/Throat: Oropharynx is clear and moist.  Poor dentition, many teeth missing.  Eyes: Conjunctivae are normal. Pupils are equal, round, and reactive to light.  Nystagmus on left to right scanning.  Neck: Normal range of motion.  Thyroid enlarged, no nodules appreciated on palpation. Patient has cervical adenopathy on right submandibular and left posterior cervical areas.  Cardiovascular: Normal rate, regular rhythm and intact distal pulses.   Diastolic murmur heard best in RUS border.  Pulmonary/Chest: Effort normal.  Occasional expiratory wheezing heard on right side.  Abdominal: Soft. There is no tenderness.  Musculoskeletal: Normal range of motion. He exhibits no edema or tenderness.  Neurological: He is alert and oriented to person, place, and time. He has normal strength and normal  reflexes. He displays a negative Romberg sign.  Weakness of right lower face near the mouth and decreased tongue protrusion. Some slurred speech.  Skin: Skin is warm.  Psychiatric: He has a normal mood and affect.    Assessment & Plan:  Please see problem based charting for assessment and plan.

## 2015-04-27 ENCOUNTER — Telehealth: Payer: Self-pay | Admitting: *Deleted

## 2015-04-27 LAB — RPR: RPR Ser Ql: NONREACTIVE

## 2015-04-27 NOTE — Telephone Encounter (Signed)
Pt does not have insurance except VA insurance; unable to do Endocrinology referral. But if u still want U/S of thyroid with bx, need an order. Thanks.

## 2015-04-27 NOTE — Telephone Encounter (Signed)
-----   Message from Darreld Mclean, MD sent at 04/26/2015  4:59 PM EDT ----- Regarding: Referral Hi Glenda,  I wanted to refer Mr. Richens for an Ultrasound of Thyroid with biopsy. I put an ambulatory referral in to Endocrinology and was wondering if there was anything else I needed to do for this or if I should put a different order.  Thanks! Vishal Allena Katz

## 2015-04-27 NOTE — Telephone Encounter (Signed)
Call pt to let him know - left message if he has to pay out of pocket, will he be willing to do so and call to let me know.

## 2015-04-28 ENCOUNTER — Other Ambulatory Visit: Payer: Self-pay

## 2015-04-28 ENCOUNTER — Other Ambulatory Visit: Payer: Self-pay | Admitting: Internal Medicine

## 2015-04-28 DIAGNOSIS — I639 Cerebral infarction, unspecified: Secondary | ICD-10-CM

## 2015-04-28 NOTE — Patient Outreach (Addendum)
[facility]) Care Management  04/28/2015  Riker Collier Sep 16, 1945 [id number]   SUBJECTIVE:  Telephone call to patient regarding EMMI stroke program referral.  HIPAA verified with patient.  Reviewed EMMI stroke program with patient. Patient verbally agreed to receive calls from [facility].  Patient states, "I'm doing pretty good, just wish I was talking better." Patient denies having any speech therapy.  Patient states he has followed up with doctor at [facility] within the past 2 days.  Patient states the doctor increase his blood pressure pill dosage due to his blood pressure being elevated.   Patient states he has picked up the prescription and started taking.  Patient confirms he has all of his medications.  Patient states he is to follow up with his primary doctor in 2 weeks. Patient reports he does not have a blood pressure monitor. Patient states he drives himself to and from his doctor appointment.  Patient denies any visual deficits.  Patient states his right side of body drifts some to the right.  Patient denies numbness or tingling sensation in extremities.  Patient states he does not have to ambulate with any equipment. Patient denies falls. Patient states he does not have any social support. Patient states he does feel tired on occasion. Patient denies headaches. Patient denies having scheduled appointment with neurologist, Dr. Roda Shutters  ASSESSMENT: patient speaking slowly but clearly.  No apparent deficits cognitively. RNCM made patient aware per his discharge summary that he is to have a follow up appointment with Dr. Roda Shutters, neurologist. RNCM attempted to call Dr. Warren Danes office at patients consent to schedule patient appointment.  Office closed at time of outreach.  RNCM will re-attempt to schedule appointment for patient. Patient agreed to receive follow up appointment with [facility]. RNCM advised patient to purchase blood pressure monitor.  RNCM advised patient that he can check his  blood pressure at local drug stores.   PLAN: RNCM will follow up with patient within 1 week.  RNCM will send patient EMMI education material regarding signs and symptoms of stroke and low sodium diet.  RNCM will schedule neurology follow up appointment for patient within 3 business days.  RMCM  Will contact patients primary provider to determine if therapy needed.  RNCM will refer patient to [facility] Care management social worker for [employer] information.   [name] RN,BSN,CCM [facility] Telephonic Care Coordinator [phone]

## 2015-04-28 NOTE — Addendum Note (Signed)
Addended by: [name] E on: 04/28/2015 03:15 PM   Modules accepted: Orders

## 2015-05-01 ENCOUNTER — Other Ambulatory Visit: Payer: Self-pay

## 2015-05-01 VITALS — Ht 69.0 in | Wt 145.0 lb

## 2015-05-01 NOTE — Telephone Encounter (Signed)
Spoke to Mr. [name] regarding having [place] of thyroid with biopsy. 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 Understandably, he does not think he would be able to pay for procedure out of pocket, so I advised that he may need to try to establish some form of care at the [facility] if he can receive medical coverage there. Will continue to follow up with patient in clinic and try to find the best way for Thomas [name] to have his thyroid evaluated. I also informed patient of his negative RPR results which he understands.

## 2015-05-01 NOTE — Patient Outreach (Signed)
[facility]) Care Management  05/01/2015  Salvador Coupe 1946/04/14 [id number]   Request from [name], RN to assign SW, assigned [employer], LCSW.  Thanks, [name]. [name] [facility] Care Management [facility] CM Assistant Phone: [phone] Fax: [phone]

## 2015-05-01 NOTE — Patient Outreach (Addendum)
[facility]) Care Management  05/01/2015  Thomas [name] 1946/03/12 [id number]  SUBJECTIVE: Telephone call to patient regarding EMMI stroke transition follow up.  Patient states, "I am doing a little better."   Patient states, "I'm still a little tire and it hasn't gotten any worse."  Patient denies any unusual signs/symptoms.  Patient states he is taking his medication as prescribed by the doctor.  Patient confirms he does not have any family/friend that checks on him.  Patient verbally agreed to receive follow up with social worker for [employer] assistance.   ASSESSMENT: EMMI stroke transition program follow up.  Patient will benefit from social work referral for community resources due to insufficient social support. Patient verbally agreed to next outreach call with RNCM.  Per EPIC note on 05/01/15 from Dr. Allena Katz, Patient needs ultrasound of thyroid with biopsy.    PLAN: RNCM will refer patient to Child psychotherapist. RNCM will follow up with patient within 1 week.  RNCM will review EMMI education material with patient at next outreach.   [name] RN,BSN,CCM [facility] Telephonic Care Coordinator [phone]

## 2015-05-01 NOTE — Progress Notes (Signed)
Internal Medicine Clinic Attending  I saw and evaluated the patient.  I personally confirmed the key portions of the history and exam documented by Dr. Patel,Vishal and I reviewed pertinent patient test results.  The assessment, diagnosis, and plan were formulated together and I agree with the documentation in the resident's note.  

## 2015-05-02 LAB — HIV-1 RNA QUANT-NO REFLEX-BLD

## 2015-05-04 ENCOUNTER — Other Ambulatory Visit: Payer: Self-pay

## 2015-05-04 ENCOUNTER — Other Ambulatory Visit: Payer: Self-pay | Admitting: *Deleted

## 2015-05-04 NOTE — Patient Outreach (Signed)
[facility]) Care Management  05/04/2015  Thomas [name] 1945/09/29 [id number]  CSW received a new referral on patient from [name], Telephonic Nurse Case Manager with [facility] Care Management, reporting that patient is in need of community resources to assist with social support.  [name] went on to say that patient currently lives at home alone in [place] and is unfamiliar with resources in the area. CSW made an initial attempt to try and contact patient today to perform phone assessment, as well as assess and assist with social work needs and services, without success.  A HIPAA compliant message was left on voicemail.  CSW is currently awaiting a return call.  [name], BSW, MSW, LCSW  Licensed Restaurant manager, fast food [facility]  Mailing [place] [address] [place], [place] [address] Physical Address-[address], [place], [place] [address] Toll Free Main # [phone] Fax # [phone] Cell # [phone]  Fax # [phone]  [email or web address]

## 2015-05-04 NOTE — Patient Outreach (Signed)
[facility]) Care Management  05/04/2015  Thomas [name] 1946-04-22 [id number]   Telephone call to patient regarding EMMI stroke program follow up. Unable to reach patient. HIPAA compliant voice message left with call back phone number.   PLAN: RNCM will attempt 2nd outreach to patient within 3 business days.   [name] RN,BSN,CCM [facility] Telephonic Care Coordinator [phone]

## 2015-05-05 ENCOUNTER — Other Ambulatory Visit: Payer: Self-pay

## 2015-05-05 ENCOUNTER — Encounter: Payer: Self-pay | Admitting: *Deleted

## 2015-05-05 ENCOUNTER — Encounter: Payer: Self-pay | Admitting: Internal Medicine

## 2015-05-05 ENCOUNTER — Ambulatory Visit (INDEPENDENT_AMBULATORY_CARE_PROVIDER_SITE_OTHER): Payer: Self-pay | Admitting: Internal Medicine

## 2015-05-05 VITALS — BP 174/100 | HR 74 | Temp 98.1°F | Ht 69.0 in | Wt 147.9 lb

## 2015-05-05 DIAGNOSIS — I69393 Ataxia following cerebral infarction: Secondary | ICD-10-CM

## 2015-05-05 DIAGNOSIS — I69351 Hemiplegia and hemiparesis following cerebral infarction affecting right dominant side: Secondary | ICD-10-CM

## 2015-05-05 DIAGNOSIS — E049 Nontoxic goiter, unspecified: Secondary | ICD-10-CM

## 2015-05-05 DIAGNOSIS — I63312 Cerebral infarction due to thrombosis of left middle cerebral artery: Secondary | ICD-10-CM

## 2015-05-05 DIAGNOSIS — I69328 Other speech and language deficits following cerebral infarction: Secondary | ICD-10-CM

## 2015-05-05 DIAGNOSIS — Z21 Asymptomatic human immunodeficiency virus [HIV] infection status: Secondary | ICD-10-CM

## 2015-05-05 DIAGNOSIS — B20 Human immunodeficiency virus [HIV] disease: Secondary | ICD-10-CM

## 2015-05-05 DIAGNOSIS — I10 Essential (primary) hypertension: Secondary | ICD-10-CM

## 2015-05-05 MED ORDER — LISINOPRIL-HYDROCHLOROTHIAZIDE 20-25 MG PO TABS: 1.0000 | ORAL_TABLET | Freq: Every day | ORAL | Status: DC

## 2015-05-05 NOTE — Patient Outreach (Signed)
[facility]) Care Management  05/05/2015  Thomas [name] Feb 03, 1946 [id number]  Telephone call to patient regarding EMMI stroke red alert follow up.  Unable to reach patient.  HIPAA compliant voice message left with call back phone number.    PLAN; RNCM will attempt telephone outreach call within 3 business days.   [name] RN,BSN,CCM [facility] Telephonic Care Coordinator [phone]

## 2015-05-05 NOTE — Patient Instructions (Signed)
Thank you for your visit Thomas [name].  I will increase your blood pressure medication Lisinopril-Hydrochlorothiazide to 20-25 mg take once a day.  Continue your other medications as you are.  Try to speak with the financial advisors at the [facility] hospital for assistance and qualification for medical care at those hospitals.  We will check some blood tests including checking your thyroid function and repeating the test from last week.  Please follow up in 1 week.

## 2015-05-05 NOTE — Patient Outreach (Signed)
[facility]) Care Management  05/05/2015  Thomas [name] 1946-03-18 [id number]  SUBJECTIVE:  Telephone call to patient for EMMI stroke follow up.  Patient states he is presently driving to the doctors office to get papers signed for work.  Request RNCM call him back later today.  PLAN; RNCM will attempt call back to patient today as requested.   [name] RN,BSN,CCM [facility] Telephonic Care Coordinator [phone]

## 2015-05-05 NOTE — Assessment & Plan Note (Signed)
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 HIV RNA from previous visit was unsatisfactory sample. RPR returned negative. -Repeat HIV RNA quantitative

## 2015-05-05 NOTE — Assessment & Plan Note (Signed)
Patient w/ enlarged thyroid w/ multiple nodules up to 2.1 cm per CTA 04/21/2015 during stroke workup. Also has "Small mediastinal lymph nodes as well as a mildly enlarged precarinal lymph node measuring 1.3 cm in short axis, nonspecific." TSH on 8/18 WNL at 1.496. He denies any neck pain, dysphagia, palpitations, heat or cold intolerance, changes in weight, or other concerning symptoms. Unable to palpate thyroid nodules on exam. There is a palpable submandibular nodule felt on the right side.  -Repeat TSH, check T4 -Continue to work with patient to establish insurance to cover Thyroid US and biopsy, may be able to have completed through IR -Follow up in 1 week with Jaynee Eagles to discuss insurance possibilities, patient may qualify for Medicare. He will contact VA to see if he qualifies based on veteran status.

## 2015-05-05 NOTE — Progress Notes (Signed)
Patient ID: Thomas [name], male   DOB: April 08, 1946, 69 y.o.   MRN: [id number]   Subjective:   Patient ID: Thomas [name] male   DOB: Dec 01, 1945 69 y.o.   MRN: [id number]  HPI: [name] is a 69 y.o. with PMH of HTN and CVA who presents for follow up for blood pressure check. He also is here to have return to work documents completed.  Please see problem list for details.    Past Medical History  Diagnosis Date  . HTN (hypertension)   . Enlarged thyroid 04/22/2015    Seen on CTA 04/21/15: Contains multiple nodules measuring up to 2.1 cm in size, TSH wnl.  Needs thyroid ultrasound and biopsy  . 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  Recommend 1 year follow up with CT or MRI.   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 aspirin EC 81 MG EC tablet Take 1 tablet (81 mg total) by mouth daily. 30 tablet 3  . atorvastatin (LIPITOR) 40 MG tablet Take 1 tablet (40 mg total) by mouth daily at 6 PM. 30 tablet 3  . lisinopril-hydrochlorothiazide (PRINZIDE,ZESTORETIC) 20-25 MG per tablet Take 1 tablet by mouth daily. 30 tablet 3   No current facility-administered medications for this visit.   Family History  Problem Relation Age of Onset  . Hypertension Mother   . Hyperlipidemia Mother    Social History   Social History  . Marital Status: Single    Spouse Name: N/A  . Number of Children: N/A  . Years of Education: N/A   Social History Main Topics  . Smoking status: Never Smoker   . Smokeless tobacco: None  . Alcohol Use: No  . Drug Use: No  . Sexual Activity: Not Asked   Other Topics Concern  . None   Social History Narrative   Review of Systems: Review of Systems  Constitutional: Negative for fever, chills, weight loss, malaise/fatigue and diaphoresis.  HENT: Negative for congestion and sore throat.   Eyes: Negative for double vision and pain.    Respiratory: Negative for cough, sputum production, shortness of breath and wheezing.   Cardiovascular: Negative for chest pain, palpitations and leg swelling.  Gastrointestinal: Negative for heartburn, nausea, vomiting, abdominal pain, diarrhea and constipation.  Genitourinary: Negative for dysuria.  Musculoskeletal: Negative for myalgias, back pain, joint pain, falls and neck pain.  Neurological: Positive for weakness. Negative for dizziness, tingling, sensory change and headaches.       Weakness of right hand. He states his slurred speech is improving. Occasional balance difficulty drifting towards his right side. Denies any falls.  Psychiatric/Behavioral: The patient is not nervous/anxious and does not have insomnia.     Objective:  Physical Exam: Filed Vitals:   05/05/15 1052  BP: 174/100  Pulse: 74  Temp: 98.1 F (36.7 C)  TempSrc: Oral  Height: 5\' 9"  (1.753 m)  Weight: 147 lb 14.4 oz (67.087 kg)  SpO2: 100%   Physical Exam  Constitutional: He is oriented to person, place, and time. He appears well-developed and well-nourished. No distress.  HENT:  Head: Normocephalic and atraumatic.  Eyes: Pupils are equal, round, and reactive to light.  EOM normal, slight nystagmus on right to left gaze that is improved from last visit.  Neck: Normal range of motion. No tracheal deviation present.  No thyroid nodules felt on palpation. Submandibular nodule felt on  right side.  Cardiovascular: Normal rate and regular rhythm.   Pulmonary/Chest: Effort normal and breath sounds normal.  Abdominal: Soft. There is no tenderness.  Musculoskeletal: Normal range of motion. He exhibits no edema or tenderness.  Neurological: He is alert and oriented to person, place, and time. He has normal strength and normal reflexes. No sensory deficit. Coordination and gait normal.  Speech slow, slight slurring. Decreased rapid finger tapping in right hand.   Skin: Skin is warm.  Psychiatric: He has a normal  mood and affect.    Assessment & Plan:  Please see problem based charting for assessment and plan.

## 2015-05-05 NOTE — Assessment & Plan Note (Signed)
Patient believes his speech is improving but feels his hand weakness and trouble with balance drifting towards the right are about the same. He states that he has weakness lifting a 20 lb case at home, which was not an issue prior to his stroke. He has returned with his work forms filled on last visit.  Neuro exam WNL except for decreased rapid finger tapping of right hand and slight nystagmus when scanning from left to right. Strength, ROM, and Gait normal.  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 Roda Shutters on 07/05/2015 -Annual CTA/MRA for ectatic aortic arch in 04/2016

## 2015-05-05 NOTE — Assessment & Plan Note (Signed)
BP Readings from Last 3 Encounters:  05/05/15 174/100  04/26/15 173/106  04/22/15 182/106   BPs continue to be hight at 174/100 today with repeat 180/102. On lisinopril-hctz 20-12.5 mg which was increased from 10-12.5 mg on last visit. Patient has been taking for 1.5 weeks daily, states he is compliant. -Will increase Lisinopril-HCTZ to 20-25 mg and follow up in 1 week.

## 2015-05-05 NOTE — Patient Outreach (Signed)
[facility]) Care Management  05/05/2015  Thomas [name] 22-Apr-1946 [id number]   Patient triggered RED on EMMI Stroke Dashboard, notification sent to [name], RN.  Thanks, [name]. [name] [facility] Care Management [facility] CM Assistant Phone: [phone] Fax: [phone]

## 2015-05-06 LAB — TSH: TSH: 0.823 u[IU]/mL (ref 0.450–4.500)

## 2015-05-06 LAB — T4: T4, Total: 8.8 ug/dL (ref 4.5–12.0)

## 2015-05-09 ENCOUNTER — Other Ambulatory Visit: Payer: Self-pay | Admitting: *Deleted

## 2015-05-09 ENCOUNTER — Other Ambulatory Visit: Payer: Self-pay

## 2015-05-09 ENCOUNTER — Encounter: Payer: Self-pay | Admitting: *Deleted

## 2015-05-09 ENCOUNTER — Ambulatory Visit: Payer: Self-pay

## 2015-05-09 LAB — HIV-1 RNA QUANT-NO REFLEX-BLD
HIV-1 RNA Viral Load Log: 4.196 log10copy/mL
HIV-1 RNA Viral Load: 15710 copies/mL

## 2015-05-09 NOTE — Patient Outreach (Signed)
[facility]) Care Management  [facility] Care Manager  05/09/2015   Thomas [name] 1946-05-14 [id number]  Subjective: Telephone call to patient regarding EMMI stroke follow up.  Patient states he is doing ok.  Patient states he is concerned about his voice.  Patient states he is unable to talk any louder and that kinda bothers him. Patient reports he is walking better but he is still not 100%.   Patient reports he had a follow up visit with his primary MD on 05/05/15.  Patient states he had papers related to work completed.  Patient states his blood pressure was still elevated. States his primary MD increased his lisinopril.  Patient states he went to his pharmacy to pick up the new prescription over the weekend but the order for the new dosage of lisinopril was not in.  Patient denies any new signs and symptoms.  Patient specifically denies severe headache and/or visual disturbances.  Patient states his primary MD wants him to check his blood pressure at least once a day.  Patient states he is unable to afford a blood pressure  Monitor at this time.   Patient states he is suppose to follow up with his primary MD within 1 week.  Patient reports that his doctor wants him to have an ultrasound of his thyroid.  Patient states he has nodules on his thyroid.  Patient states he has to go to the [place] and find out about his health benefits.  Patient reports he spoke with Thomas [name], social worker with [facility] care management today.  Patient states he wants to move to a different place. Patient states Thomas [name] is going to send him some information regarding affordable housing.    RNCM advised patient to check blood pressure at his pharmacy, record and report it to his primary MD. [facility] called patients pharmacy, [facility] on [address], in [place] and confirmed patients new prescription for lisinopril is now available and ready for pick up.  RNCM notified patient of this. RNCM advised patient  regarding signs and symptoms of stroke and to report these signs immediately to doctor or call EMS.  RNCM gave patient contact information and address for [facility] clinic in [facility].  RNCM reviewed VA health benefits application for with patient.   Current Medications:  Current Outpatient Prescriptions  Medication Sig Dispense Refill  . aspirin EC 81 MG EC tablet Take 1 tablet (81 mg total) by mouth daily. 30 tablet 3  . atorvastatin (LIPITOR) 40 MG tablet Take 1 tablet (40 mg total) by mouth daily at 6 PM. 30 tablet 3  . lisinopril-hydrochlorothiazide (PRINZIDE,ZESTORETIC) 20-25 MG per tablet Take 1 tablet by mouth daily. 30 tablet 3   No current facility-administered medications for this visit.    Functional Status:  In your present state of health, do you have any difficulty performing the following activities: 05/09/2015 05/05/2015  Hearing? N N  Vision? N N  Difficulty concentrating or making decisions? N N  Walking or climbing stairs? N N  Dressing or bathing? N N  Doing errands, shopping? N N  Preparing Food and eating ? N -  Using the Toilet? N -  In the past six months, have you accidently leaked urine? N -  Do you have problems with loss of bowel control? N -  Managing your Medications? N -  Managing your Finances? N -  Housekeeping or managing your Housekeeping? N -    Fall/Depression Screening: [facility] 2/9 Scores 05/09/2015 05/09/2015 05/05/2015 05/01/2015  04/26/2015  PHQ - 2 Score 0 0 0 1 1    Assessment: EMMI stroke transition follow up.  Patients most recent blood pressure at primary MD office visit on 05/05/15 per EPIC was 174/100.  Primary MD increased patients lisinopril to 20-25mg .  Plan:  RNCM left message for Thomas [name], social worker, regarding possible assistance for patient to obtain blood pressure monitor.  RNCM will follow up with patient within 1 business day to confirm patient picked up prescription.  Patient will report blood pressure reading to [facility] at next  outreach call.   [name] RN,BSN,CCM [facility] Telephonic Care Coordinator [phone]

## 2015-05-09 NOTE — Patient Outreach (Signed)
[facility]) Care Management  [facility] Social Work  05/09/2015  Yuvan Medinger [birth year] [id number]  Subjective:    "I need help findings somewhere else to live".  Objective:   CSW agreed to provide patient with a list of affordable housing options for Seniors in [place] and [place].  Current Medications:  Current Outpatient Prescriptions  Medication Sig Dispense Refill  . aspirin EC 81 MG EC tablet Take 1 tablet (81 mg total) by mouth daily. 30 tablet 3  . atorvastatin (LIPITOR) 40 MG tablet Take 1 tablet (40 mg total) by mouth daily at 6 PM. 30 tablet 3  . lisinopril-hydrochlorothiazide (PRINZIDE,ZESTORETIC) 20-25 MG per tablet Take 1 tablet by mouth daily. 30 tablet 3   No current facility-administered medications for this visit.    Functional Status:  In your present state of health, do you have any difficulty performing the following activities: 05/09/2015 05/05/2015  Hearing? N N  Vision? N N  Difficulty concentrating or making decisions? N N  Walking or climbing stairs? N N  Dressing or bathing? N N  Doing errands, shopping? N N  Preparing Food and eating ? N -  Using the Toilet? N -  In the past six months, have you accidently leaked urine? N -  Do you have problems with loss of bowel control? N -  Managing your Medications? N -  Managing your Finances? N -  Housekeeping or managing your Housekeeping? N -    Fall/Depression Screening:  PHQ 2/9 Scores 05/09/2015 05/05/2015 05/01/2015 04/26/2015  PHQ - 2 Score 0 0 1 1    Assessment:   CSW was able to make initial contact with patient today to perform phone assessment, as well as assess and assist with social work needs and services.  CSW introduced self, explained role and types of services provided through [employer] Care Management [facility] Care Management).  CSW further explained to patient that CSW works with [name], Telephonic Nurse Case Manager, also with [facility] Care Management.  CSW then explained the reason for the call, indicating that [name] thought that patient would benefit from social work services and resources to assist with possible referrals to various community agencies and resources.  CSW obtained two HIPAA compliant identifiers from patient, which included patient's name and date of birth. Patient reported that he currently resides in [place], [place], but that he is looking to move.  Patient admitted that he is not familiar with [place], nor does he have family members or friends in the area.  Patient does not feel comfortable with his current living arrangements, requesting that CSW assist him with trying to relocate.  CSW agreed to provide patient with resource information to assist patient with obtaining socialization in the community, as well as provide patient with a list of affordable housing resources in [place] and [place].  Patient indicated that he would be more than happy to call around to obtain additional information about services available, if he only knew where to start.  Patient also agreed to tour facilities of interest for possible placement options.  Plan:   CSW will hand deliver a packet of resource information to patient's current place of residence,on Monday, September 6th, containing all of the following information: Chiropodist ([facility] [place]) brochure. [facility] brochure. Program of All-Inclusive Care for the Elderly brochure. [place] [employer] Options for Seniors packet. Housing for [employer]. CSW will contact patient in one  week (Tuesday, September 13th) to ensure that patient has been able to make contact with affordable housing agencies for seniors, as well as tour housing agencies of interest.  [name], BSW, MSW, LCSW  Licensed Clinical Social Financial planner [facility]   Mailing [place] [address] [place], [place] [address] Physical Address-[address], [place], [place] [address] Toll Free Main # [phone] Fax # [phone] Cell # [phone]  Fax # [phone]  [email or web address]

## 2015-05-10 ENCOUNTER — Other Ambulatory Visit: Payer: Self-pay

## 2015-05-10 VITALS — BP 135/94

## 2015-05-10 DIAGNOSIS — I639 Cerebral infarction, unspecified: Secondary | ICD-10-CM

## 2015-05-10 DIAGNOSIS — I10 Essential (primary) hypertension: Secondary | ICD-10-CM

## 2015-05-11 ENCOUNTER — Other Ambulatory Visit: Payer: Self-pay | Admitting: *Deleted

## 2015-05-11 NOTE — Patient Outreach (Addendum)
[facility]) Care Management  05/11/2015  Thomas [name] [birth year] [id number]  Late entry for 05/10/15 SUBJECTIVE:  Telephone call to patient regarding EMMI stroke transition follow up.  Patient states he picked up his new prescription for Lisinopril and started taking today.  Patient states he went to the pharmacy to take his blood pressure a few hours after taking his blood pressure medication.  Patient states his blood pressure on today was 135/94.  Patient states he took his blood pressure on yesterday at the pharmacy and his blood pressure was 164/96.    Patient states he will contact the [place] clinic in [place] on 05/11/15 to apply for VA benefits.  Patient states he heard from the Medicare office and was told his Part A Medicare is effective immediately and his Part B will not be effective until the 1st of January 2017.   OBJECTIVE:  Medications Reviewed Today    Reviewed by Otho Ket, RN (Registered Nurse) on 05/11/15 at [phone]  Med List Status: <None>   Medication Order Taking? Sig Documenting Provider Last Dose Status Informant   aspirin EC 81 MG EC tablet [id number] Yes Take 1 tablet (81 mg total) by mouth daily. Darreld Mclean, MD Taking Active    atorvastatin (LIPITOR) 40 MG tablet [id number] Yes Take 1 tablet (40 mg total) by mouth daily at 6 PM. Darreld Mclean, MD Taking Active    lisinopril-hydrochlorothiazide (PRINZIDE,ZESTORETIC) 20-25 MG per tablet [id number] Yes Take 1 tablet by mouth daily. Darreld Mclean, MD Taking Active          ASSESSMENT:  EMMI stroke transition program PATIENT AGREED TO FOLLOW UP CALL WITH RNCM within 1 week.   PLAN:  RNCM notified patient that he is eligible to receive blood pressure monitor from [facility] Care Management.   RNCM will follow up with patient within 1 week.  RNCM will refer patient to community case manager to provide blood pressure monitor and education.  RNCM will await feedback from [name], social worker on  community resources for patient and follow up on patients VA and Medicare. Patient will continue to monitor and record blood pressures once blood pressure monitor received.  RNCN advised patient to take recorded blood pressure readings to his next follow up visit with his doctors.     [name] RN,BSN,CCM [facility] Telephonic Care Coordinator [phone]

## 2015-05-11 NOTE — Patient Outreach (Signed)
[facility]) Care Management  05/11/2015  Thomas [name] 1946-03-17 [id number]   Request from [name], RN to assigned Community RN, assigned Elliot Cousin, RN.  Thanks, [name]. [name] [facility] Care Management [facility] CM Assistant Phone: [phone] Fax: [phone]

## 2015-05-11 NOTE — Progress Notes (Signed)
Internal Medicine Clinic Attending  I saw and evaluated the patient.  I personally confirmed the key portions of the history and exam documented by Dr. Patel,Vishal and I reviewed pertinent patient test results.  The assessment, diagnosis, and plan were formulated together and I agree with the documentation in the resident's note.  

## 2015-05-11 NOTE — Patient Outreach (Signed)
[facility]) Care Management  05/11/2015  [name] [birth year] [id number]   CSW was able to make contact with patient today to explain that CSW would be providing patient with additional resource information to help patient tap into his CIGNA benefits, as well as Adult Medicaid benefits.  Patient is an Adult Medicaid recipient with the [facility] Department of Social Services.  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  CSW has agreed to assist patient with tapping into these valuable resources.  In addition to the resources already provided to patient, patient will also receive the following: Database administrator with [employer]. Answers to Typical Elder Care Legal Questions. 2015 Veterans Benefits and Medicaid Desk Reference. Comprehensive List of CIGNA benefits. CSW learned from [name], Telephonic Nurse Case Manager with [facility] Care Management [facility] Care Management), that patient is in need of a blood pressure monitor for home use, but is unable to afford the expense.  CSW completed an order form, indicating that patient is indigent (Adult Medicaid recipient's are automatically eligible), allowing patient to receive an Om[employer] Blood Pressure Monitor, free of charge, through [facility] Care Management.  The completed and signed form has been given to Livia Snellen, Chiropodist with [facility] Care Management, for approval.  [name] will submit an order for patient to receive a community RNCM with [facility] Care Management, who will perform a home visit with patient to deliver the blood pressure monitor, as well as educate patient on proper use. CSW will prescribe and print EMMI information to review with patient at the initial home visit.  CSW will make contact with patient on Tuesday, September 13th to ensure that patient received the packet of information that CSW  mailed to patient's home.  CSW will also schedule an initial home visit with patient, during that time, to review information, as well as assist with completion of applications.  [name], BSW, MSW, LCSW  Licensed Restaurant manager, fast food Health System  Mailing [place] [address], [place], [place] [address] Physical Address-[address], [place], [place] [address] Toll Free Main # [phone] Fax # [phone] Cell # [phone]  Fax # [phone]  [email or web address]

## 2015-05-15 ENCOUNTER — Other Ambulatory Visit: Payer: Self-pay | Admitting: *Deleted

## 2015-05-16 ENCOUNTER — Other Ambulatory Visit: Payer: Self-pay | Admitting: *Deleted

## 2015-05-16 NOTE — Patient Outreach (Signed)
[facility]) Care Management  05/16/2015  Juddson Cobern [birth year] [id number]   CSW was able to make contact with patient today to follow-up regarding the packet of resource information left for patient at doorstep, as patient was not available at the time of CSW's arrival.  Patient admitted the packet of information was not received.  CSW explained to patient that CSW delivered the packet of information to patient's home on September 8th, in hopes to actually be able to meet with patient to review the information and assist with completion of applications for services.  Again, patient denied ever receiving the information.  CSW confirmed the correct home address for patient, agreeing to mail out a second packet of information, including all of the following content: Promoting Independent Living (Seniors Resources of [place]) brochure Adult Center for [employer] Program of All-Inclusive Care for the Elderly brochure [place] Affordable Living Options for Seniors packet Housing for Seniors packet [employer] and Resources that Assist with Finances Answers to Typical Elder Care Legal Questions 2015 Veterans Benefits and Medicaid Desk Reference Comprehensive List of Veterans Administration benefits CSW encouraged patient to contact CSW as soon as the packet of information is received so that CSW can schedule an initial home visit with patient to review it's contents and assist with completion of applications. CSW provided patient with CSW's contact information, having patient recite the contact number.  Last, CSW explained to patient that he is eligible to receive a blood pressure monitor, free of charge, through [employer] Care Management and that an [facility], also with [facility] Care Management, will make arrangements to meet with patient in the home to instruct him on proper use.  Patient voiced understanding and was agreeable to this plan.   Patient is aware that CSW will await a return call from patient once the packet of resource information is received.  No additional social work needs identified at this time.  [name], BSW, MSW, LCSW  Licensed Restaurant manager, fast food [facility] System  Mailing [place] [address] [place], [place] [address] Physical Address-[address], [place], [place] [address] Toll Free Main # [phone] Fax # [phone] Cell # [phone]  Fax # [phone]  [email or web address]

## 2015-05-16 NOTE — Patient Outreach (Signed)
[facility]) Care Management  05/16/2015  Ziquan Fidel 07-29-1946 [id number]  RN spoke with pt today concerning [facility] community home visits and available services. Discussed pt's management of care related to his HTN and past stroke. Discussed available programs and services and completed the initial assessment telephonically.Pt states he goes to the local drug store everyday and takes his BP with today's read at 140/92. Pt denies any symptoms related to hypo-hypertension when discussed. Pt has denied any support system and no current caregiver available but will try to obtain an emergency contact person for future incidents. RN extended [facility] services and offered a home visit ( pt agreed). Will schedule the initial home visit and further discuss HTN.  Pt states he has spoken with th [facility] social worker and several resources will be sent in through the mail. [facility] social worker has completed a financial and will obtain a BP home device for this pt that this RN will deliver on the scheduled home visit.  Pt also reports he has an appointment with DSS for possible MCD application and other services that he may qualify for services. No other inquires or request at this time as RN will follow up accordingly,.    Elliot Cousin, RN Care Management Coordinator [facility] Main Office [phone]

## 2015-05-16 NOTE — Patient Outreach (Addendum)
[facility]) Care Management  05/15/2015  Thomas [name] September 07, 1945 [id number]   Unsuccessful attempt on the initial outreach. Will continue to contact this pt for [facility] services.  Elliot Cousin, RN Care Management Coordinator [facility] Main Office [phone]

## 2015-05-17 ENCOUNTER — Ambulatory Visit: Payer: Self-pay

## 2015-05-18 ENCOUNTER — Other Ambulatory Visit: Payer: Self-pay

## 2015-05-18 ENCOUNTER — Other Ambulatory Visit: Payer: Self-pay | Admitting: *Deleted

## 2015-05-18 NOTE — Patient Outreach (Signed)
[facility]) Care Management  05/18/2015  Thomas [name] 02/16/1946 [id number]   SUBJECTIVE: Telephone call to patient for follow up EMMI stroke program.  Patient states he is doing well.  States his only complaint is that his voice still sounds very low since his stroke.  Patient confirms he has an appointment scheduled with his neurologist for 07/05/15.  Patient states he has spoke with Elliot Cousin, RNCM and [name], SW who will be coming out to visit him on next week. Patient states he has qualified for Medicare part A. Patient states he has an appointment on Monday 05/22/15 for social security.  Patient states he has been monitoring his blood pressures at the pharmacy and recording.   Patient states he received EMMI education material regarding STROKE AND LOW SODIUM DIET.  Patient verbally agreed to next follow up call with this RNCM.   ASSESSMENT:  EMMI stroke transition program.   PLAN: RNCM will follow up with patient within 1 week. RNCM reviewed signs and symptoms of stroke with patient.  RNCM advised patient to call 911 for stroke symptoms.  RNCM advised patient to call primary MD as needed for symptoms.  RNCM discussed with patient low sodium food options  [name] [facility] Telephonic Care Coordinator [phone]

## 2015-05-18 NOTE — Patient Outreach (Signed)
[facility]) Care Management  05/18/2015  Thomas [name] Mar 12, 1946 [id number]   CSW was able to make contact with patient today to explain that patient is now qualified to receive Medicare Part A, effective on Wednesday, September 14th, with retro pay for 6 months, and will begin receiving Medicare Part B, effective January 1st, 2017.  CSW has notified the Financial Counseling Department at [facility], as well as the Billing Office, to ensure that they are aware of this change.  Patient will not be responsible for paying medical expenses from April 15th, 2016 to the present day, as patient should be medically covered at 100%.  Medicare will cover 80% of patient's inpatient hospitalizations, while Medicaid will cover the additional 20%.  CSW will notify patient's RNCM, Elliot Cousin, as well as Telephonic Nurse Case Manager, [name], both with [facility] Care Management, of these recent changes.  CSW will also fax a correspondence letter to patient's Primary Care Physician, Dr. Darreld Mclean.  Patient was encouraged to contact CSW as soon as his packet of resource information is received.  If CSW has not received a return call from patient by Thursday, September 22nd, CSW will reach out to patient for follow-up.  [name], BSW, MSW, LCSW  Licensed Restaurant manager, fast food Health System  Mailing [place] [address], [place], [place] [address] Physical Address-[address], [place], [place] [address] Toll Free Main # [phone] Fax # [phone] Cell # [phone]  Fax # [phone]  [email or web address]

## 2015-05-24 ENCOUNTER — Other Ambulatory Visit: Payer: Self-pay | Admitting: *Deleted

## 2015-05-24 ENCOUNTER — Encounter: Payer: Self-pay | Admitting: *Deleted

## 2015-05-24 NOTE — Patient Outreach (Addendum)
[facility]) Care Management   05/24/2015  Thomas [name] 1946/06/26 [id number]   Thomas [name] is an 69 y.o. male  Subjective: Pt receptive to enrolling into the HTN program HTN: Pt reports she continues to go to the local drug store for daily BP checks and states he takes all his medications as recommended. Pt denies any signs or symptoms of elevated BP and continue to eating a healthy diet with low sodium products. Pt not familiar with all the risk involved with HTN but receptive to ongoing education. Pt does not have a BP cuff however has excepted a provided home device via [facility] for future monitoring. Pt verified he is able to read printable material concerning his BP that [facility] RN provided. Denies any high or extremely low BP readings since RN last conversation. Pt states his provider's office has indicated what's to high on his BP but does not remember. Pt discussed his history of having a stroke in the past and able to recite some of the symptoms. Currently pt has denied any signs or symptoms experienced in the past.  Advance Directive: Pt does not have a Living Will or Health Power of Attorney however receptive to Teacher, English as a foreign language.  FOLLOW UP SOCIAL WORK: Pt reports he received the VA application and will work on completing the application and contact the involved social worker once all the information has been received.  Objective:   Review of Systems  Constitutional: Negative.   HENT: Negative.   Eyes: Negative.   Respiratory: Negative.   Cardiovascular: Negative.   Gastrointestinal: Negative.   Genitourinary: Negative.   Musculoskeletal: Negative.   Skin: Negative.   Neurological: Negative.   Endo/Heme/Allergies: Negative.   Psychiatric/Behavioral: Negative.     Physical Exam  Constitutional: He is oriented to person, place, and time. He appears well-developed and well-nourished.  HENT:  Right Ear: External ear normal.  Left Ear: External ear normal.  Nose:  Nose normal.  Cardiovascular: Regular rhythm.   Respiratory: Effort normal and breath sounds normal.  GI: Soft. Bowel sounds are normal.  Musculoskeletal: Normal range of motion.  Neurological: He is alert and oriented to person, place, and time.  Skin: Skin is warm and dry.  Psychiatric: He has a normal mood and affect. His behavior is normal. Judgment and thought content normal.    Current Medications:   Current Outpatient Prescriptions  Medication Sig Dispense Refill  . aspirin EC 81 MG EC tablet Take 1 tablet (81 mg total) by mouth daily. 30 tablet 3  . atorvastatin (LIPITOR) 40 MG tablet Take 1 tablet (40 mg total) by mouth daily at 6 PM. 30 tablet 3  . lisinopril-hydrochlorothiazide (PRINZIDE,ZESTORETIC) 20-25 MG per tablet Take 1 tablet by mouth daily. 30 tablet 3   No current facility-administered medications for this visit.    Functional Status:   In your present state of health, do you have any difficulty performing the following activities: 05/16/2015 05/09/2015  Hearing? N N  Vision? N N  Difficulty concentrating or making decisions? N N  Walking or climbing stairs? N N  Dressing or bathing? N N  Doing errands, shopping? N N  Preparing Food and eating ? N N  Using the Toilet? N N  In the past six months, have you accidently leaked urine? N N  Do you have problems with loss of bowel control? N N  Managing your Medications? N N  Managing your Finances? N N  Housekeeping or managing your Housekeeping? N N  Filed Vitals:   05/24/15 1023  BP: 136/78  Pulse: 75  Resp: 20    Fall/Depression Screening:    PHQ 2/9 Scores 05/16/2015 05/09/2015 05/09/2015 05/05/2015 05/01/2015 04/26/2015  PHQ - 2 Score 0 0 0 0 1 1    Assessment:   Introduction to the [facility] program and services Case management related to HTN Follow up on VA application related to Social Work involvement. Advance Directive   Plan:  Reintroduced the [facility] program and enrolled pt into the HTN program with  confirmed signed consent. Physical assessment completed with no acute issues today.  Will discussed the importance of HTN and stress daily monitoring. Will provide education EMMI printouts for discuss the purpose of each printout. Will also discuss information about HTN in detail for teach back method.  EMMI programs provided as followed: High Blood Pressures: Health Problems, About High Blood Pressure and High Blood pressures: What You Can Do.   RN discussed HTN and purpose of managing this medical condition to prevent future strokes with daily BP checks and taking all his prescribed medications as a preventive measure related to his risk having a second stroke (pt verbralized an understanding).  Will provide a BP device and demonstrate how to use and when to contact or seek medical attention based upon any elevated readings. Will have pt perform teach back method in demonstration how to apply the BP cuff and document all readings in the calendar tool provided for documenting all his readings.  Will verify pt has no additional inquires or request at this time and discussed the plan of care and goals in place and verify pt has a clear understanding and willing to comply with all recommendations from his providers and involved healthcare team.  Will discussed and provide packet for A.D and stress the importance of completed forms with family or friends concerning  Healthcare Power of [place]. Also follow up on recent application received via [facility] Child psychotherapist for [place] benefits. Strongly encourage pt to completed and follow up with the [facility] social worker once all information has bee completed and reading to be submitted.  No other inquires as RN will follow up in few weeks and determine pt's level of understanding and adherence to the plan of care and goals evaluated.  Elliot Cousin, RN Care Management Coordinator [facility] Main Office [phone]

## 2015-05-25 ENCOUNTER — Other Ambulatory Visit: Payer: Self-pay | Admitting: *Deleted

## 2015-05-25 ENCOUNTER — Other Ambulatory Visit: Payer: Self-pay

## 2015-05-25 NOTE — Patient Outreach (Signed)
[facility]) Care Management  05/25/2015  Thomas [name] 17-Jul-1946 [id number]  SUBJECTIVE:  Telephone follow up to patient. Patient states he is doing ok.  Patient states he is still concerned about his blood pressure readings. Patient reports he has been monitoring and recording his blood pressures daily.  Patient reports today's blood pressure 155/100. Patient reports he continues to take his medications as prescribed by his doctor. Patient denies any new signs or symptoms.  Patient states he is unsure when he is to follow up again with his primary MD, Dr. Allena Katz.    Patient states he received his blood pressure monitor from [facility] case manager today, Elliot Cousin. Patient states he was educated on the use of his blood pressure monitor.    ASSESSMENT: EMMI stroke transition follow up. Patient continues to manage care.     PLAN: RNCM will follow up with patient within 1 week. RNCM will contact patients primary MD office,  regarding patients follow up appointment.  RNCM will notify patients primary MD patient has received Medicare coverage.   [name] RN,BSN,CCM [facility] Telephonic Care Coordinator [phone]

## 2015-05-25 NOTE — Patient Outreach (Signed)
[facility]) Care Management  05/25/2015  Thomas [name] 10/30/1945 [id number]   Telephone call to patient.  Unable to reach patient. HIPAA compliant voice message left with call back phone number.  PLAN; RNCM will attempt telephone outreach to patient within 2 business days.   [name] RN,BSN,CCM [facility] Telephonic Care Coordinator [phone]

## 2015-05-25 NOTE — Patient Outreach (Signed)
[facility]) Care Management  05/25/2015  Salvadore Valvano June 03, 1946 [id number]  SUBJECTIVE; Return call from patient.  Patient states he is presently in [place] shopping and request a call back today after lunch.    PLAN: RNCM will attempt contact with patient on today 05/25/15 as requested.   [name] RN,BSN,CCM [facility] Telephonic Care Coordinator [phone]

## 2015-05-25 NOTE — Patient Outreach (Signed)
[facility]) Care Management  05/25/2015  Thomas [name] Apr 22, 1946 [id number]  CSW was able to make contact with patient today to follow-up regarding the packet of resource information that CSW mailed to patient's home last week.  Patient admitted that he received the packet and has had an opportunity to review it's contents.  Patient further reported that he is in the process of completing the applications to receive financial assistance through [employer].  Patient has been encouraged to contact a representative with [facility] ([address] [place], [place] [address]; # [phone]) to schedule an appointment to meet with a representative to discuss other benefits that he may be eligible to receive.  The contact information was provided for the [facility] office.  Patient agreed to schedule an appointment at his earliest convenience.  CSW is aware that patient was able to meet with his RNCM, also with [facility] Care Management, Elliot Cousin to discuss disease management services.  Ms. Ashley Royalty spoke with patient about completing his Advanced Directives (Living Will and HealthCare Power of [place] documents), as patient does not currently have these documents in place.  Ms. Ashley Royalty provided patient with a packet, encouraging patient to notify CSW if he needs assistance with completion.  Patient denied needing assistance, at present.  CSW will prescribe and print EMMI information for patient and mail to patient's home, explaining the importance of having these documents in place to ensure that his wishes are respected in the even that he is unable to make appropriate decisions for himself.  Patient has been encouraged to provide a copy of his completed, signed and notarized Advanced Directives to his Primary Care Physician, Dr. Darreld Mclean to place in his EMR (Electronic Medical Record) in EPIC.   Patient received a blood pressure monitor from Ms. Ashley Royalty and  was educated on proper use.  Patient is aware that he needs to routinely check his blood pressure and chart readings.  CSW inquired as to whether or not patient wished to review the other contents enclosed in the packet of information that CSW mailed to patient's home, which included all of the following:  Promoting Independent Living (Seniors Resources of [place]) brochure Adult Center for [employer] Program of All-Inclusive Care for the Elderly brochure [place] Affordable Living Options for Seniors packet Housing for Seniors packet [employer] and Resources that Assist with Finances Answers to Typical Elder Care Legal Questions 2015 Veterans Benefits and Medicaid Desk Reference Comprehensive List of Veterans Administration Benefits  Patient denied, indicating that he still needs to review the rest of the information to learn what services and resources are available to him.  CSW agreed to contact patient in one week to follow-up.  During that time, CSW will answer any questions that patient may have, as well as offer to schedule a home visit with patient to assist with completion of applications, if necessary.  No additional social work needs have been identified at this time.  CSW will fax a correspondence letter to patient's Primary Care Physician, Dr. Darreld Mclean to ensure that Dr. Allena Katz is aware of CSW's involvement with patient. CSW will converse with Elliot Cousin, RNCM with [facility] Care Management, to report findings of phone contact with patient today. CSW will contact patient in one week, on Thursday, September 29th to follow-up regarding continued need for social work involvement.  [name], BSW, MSW, LCSW  Licensed Restaurant manager, fast food [facility] System  Mailing [place] [address]  [address] [place], [place] [address] Physical Address[address], [place], [place] [address] Toll Free Main #  [phone] Fax # [phone] Cell # [phone]  Fax # [phone]  [email or web address]

## 2015-05-26 ENCOUNTER — Other Ambulatory Visit: Payer: Self-pay

## 2015-05-26 ENCOUNTER — Ambulatory Visit: Payer: Self-pay

## 2015-05-26 NOTE — Patient Outreach (Signed)
[facility]) Care Management  05/26/2015  Thomas [name] 21-Jul-1946 [id number]  SUBJECTIVE:  Telephone follow up call to inform patient of scheduled appointment with primary MD. Appointment scheduled with primary MD for Monday May 29, 2015 at 10:45am.  Patient verbalized appreciation to [facility] for scheduling appointment.  Patient verbalized he is concerned about his blood pressures being elevated.   Per MD office visit note dated 05/05/15:  Continue to work with patient to establish insurance to cover Thyroid US and biopsy, may be able to have completed through IR -Follow up in 1 week with Jaynee Eagles to discuss insurance possibilities, patient may qualify for Medicare. He will contact [employer] to see if he qualifies based on veteran status.  RNCM contacted Jaynee Eagles, financial counselor with [facility] internal medicine outpatient clinic.  Informed Ms. Hill that patient has qualified to receive Medicare Part A effective May 17, 2015  With 6 months retro and Medicare Part B effective September 03, 2015.  Ms. Loleta Chance stated she had not spoken with patient and was appreciative of the information.  RNCM in basket messaged Dr. Allena Katz of patients current insurance coverage. Dr. Allena Katz notified that patient has received blood pressure monitor and educated on usage of monitor.   ASSESSMENT: Care coordination follow and chart review.   PLAN: RNCM will follow up with patient at next scheduled outreach.    [name] RN,BSN,CCM [facility] Telephonic Care Coordinator [phone]

## 2015-05-29 ENCOUNTER — Other Ambulatory Visit: Payer: Self-pay | Admitting: *Deleted

## 2015-05-29 ENCOUNTER — Ambulatory Visit: Payer: Medicare Other | Admitting: Internal Medicine

## 2015-05-29 NOTE — Patient Outreach (Signed)
[facility]) Care Management  05/29/2015  Izeyah Deike 1945/10/25 [id number]   RN received a call from pt indicating he had an appointment today however did not remember the time. RN able to confirm his appointment time today however pt states he may not be able to make the appointment and wound contact Dr. Eliane Decree office and reschedule.   Pt reports he is using his BP device with a read of 141/90 this morning with no encountered issues. RN continued to encouraged daily monitoring and offered a home visit instead of a telephonic call next month for follow up however pt states he will go back to work on Monday and will not be available for visits. RN confirmed next month's telephonic follow up and encouraged pt to contact this RN if any further symptoms or issues arise. Pt with understanding with no additional inquires or request at this time.   Elliot Cousin, RN Care Management Coordinator [facility] Main Office [phone]

## 2015-05-31 ENCOUNTER — Other Ambulatory Visit: Payer: Self-pay

## 2015-05-31 NOTE — Patient Outreach (Signed)
[facility]) Care Management  05/31/2015  Thomas [name] 07/30/1946 [id number]  SUBJECTIVE:  Telephone call to patient regarding EMMI stroke program.  Patient reports he is doing well.  Patient denies any new onset of symptoms.  Patient states, "my voice is getting better."  Patient states he plans on returning to work next Monday June 05, 2015.  Patient states he did go to his appointment with his primary MD on Monday May 29, 2015.  Patient states he was unable to be seen by his doctor at the time of scheduled appointment so he rescheduled for Thursday June 01, 2015.  RNCM advised patient to take recorded blood pressures to appointment with primary on June 01, 2015. Patient states he continues to check and record his blood pressures.  Patient states he is taking his medications as prescribed by his doctor.  Patient states he is aware of how to contact his doctor after hours.  Patient verbalizes knowledge of stroke signs and symptoms and states he knows to call 911 for these symptoms.     ASSESSMENT:  EMMI stroke transition program closure. Patient is self managing health condition.  Patient has met EMMI stroke program goals.  Patient will continue to be followed by Raina Mina, community case manager and Nat Christen, social worker.   PLAN: RNCM will notify Dr. Erlinda Hong of [place] stroke program closure due to patient meeting goals.  RNCM will refer patient to Lurline Del to close due to goals met.  RNCM will notify Raina Mina, community case manager and Nat Christen, social worker of EMMI stroke program closure.    Quinn Plowman RN,BSN,CCM [place] Coordinator [phone]

## 2015-06-01 ENCOUNTER — Ambulatory Visit (INDEPENDENT_AMBULATORY_CARE_PROVIDER_SITE_OTHER): Payer: Self-pay | Admitting: Internal Medicine

## 2015-06-01 ENCOUNTER — Encounter: Payer: Self-pay | Admitting: Internal Medicine

## 2015-06-01 ENCOUNTER — Other Ambulatory Visit: Payer: Self-pay | Admitting: *Deleted

## 2015-06-01 VITALS — BP 166/87 | HR 72 | Temp 97.6°F | Resp 20 | Ht 68.0 in | Wt 144.2 lb

## 2015-06-01 DIAGNOSIS — Z21 Asymptomatic human immunodeficiency virus [HIV] infection status: Secondary | ICD-10-CM

## 2015-06-01 DIAGNOSIS — I63312 Cerebral infarction due to thrombosis of left middle cerebral artery: Secondary | ICD-10-CM

## 2015-06-01 DIAGNOSIS — I10 Essential (primary) hypertension: Secondary | ICD-10-CM

## 2015-06-01 DIAGNOSIS — I69351 Hemiplegia and hemiparesis following cerebral infarction affecting right dominant side: Secondary | ICD-10-CM

## 2015-06-01 DIAGNOSIS — E049 Nontoxic goiter, unspecified: Secondary | ICD-10-CM

## 2015-06-01 DIAGNOSIS — B20 Human immunodeficiency virus [HIV] disease: Secondary | ICD-10-CM

## 2015-06-01 MED ORDER — LISINOPRIL-HYDROCHLOROTHIAZIDE 20-12.5 MG PO TABS: 2.0000 | ORAL_TABLET | Freq: Every day | ORAL | Status: DC

## 2015-06-01 NOTE — Assessment & Plan Note (Addendum)
Repeat TSH and T4 on last visit WNL. Patient denies any neck pain, dysphagia, palpitations, heat or cold intolerance, change in weight, or other concerning symptoms, Unable to palpate thyroid nodules on exam. Still some issues with insurance coverage, but may be able to have [place] w/ biopsy through IR.  -US thyroid with biopsy, will try to see if covered through IR

## 2015-06-01 NOTE — Patient Outreach (Signed)
[facility]) Care Management  06/01/2015  Mandela Bello 12-Sep-1945 [id number]   Erroneous Encounter.  [name], BSW, MSW, LCSW  Licensed Restaurant manager, fast food Health System  Mailing [place] [address]. [address], [place], [place] [address] Physical Address-[address], [place], [place] [address] Toll Free Main # [phone] Fax # [phone] Cell # [phone]  Fax # [phone]  [email or web address]

## 2015-06-01 NOTE — Assessment & Plan Note (Signed)
BP Readings from Last 3 Encounters:  06/01/15 166/87  05/25/15 155/100  05/24/15 136/78   Blood pressures are still high after increase in Lisnopril-HCTZ from 20-12.5 mg to 20-25 mg. He has a home blood pressure machine and brought a log of daily blood pressures with ranges from 131-164/81-97. He denies any chest pain, SOB, HA, change in vision, lightheadedness, dizziness, numbness/tingling, or other concerning symptoms.  Important to get blood pressure under control to reduce risk for future CVA. -Increase Lisinopril-HCTZ to 40-25 mg (Prescribed 20-12.5 mg two tablets daily) -Continue home blood pressure monitoring

## 2015-06-01 NOTE — Patient Outreach (Signed)
[facility]) Care Management  06/01/2015  Tylique Aull Sep 26, 1945 [id number]   Notification from Quinn Plowman, RN to close case due to goals met with [place] Management.  Thanks, Ronnell Freshwater. [place], [place] Assistant Phone: [phone] Fax: [phone]

## 2015-06-01 NOTE — Assessment & Plan Note (Addendum)
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 HIV RNA viral load on last visit returns at [address] to confirm positive HIV status. He has an appointment to see Infectious Disease Dr. Luciana Axe on 06/15/15. -Check CD4 today -Continue with scheduled appointment with Dr. Luciana Axe on 06/15/15  Addendum: CD4 returns at 190. 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 Patient called and informed of test results, treatment plan, and reason for antibiotic prophylaxis. He understands and agrees, states he will pick up medicine today (06/02/15).

## 2015-06-01 NOTE — Assessment & Plan Note (Addendum)
Patient continues Aspirin 81 mg and Atorvastatin 40 mg for his CVA history in addition to the Lisinopril-HCTZ. He feels that his right hand weakness is improving and feels ready to return to work this coming Monday 06/05/15. Neuro exam WNL except for decreased rapid finger tapping of right hand and slight weakness of right hand. Otherwise strength, ROM, and gait normal.  His blood pressures continue to be elevated, which is important to get under control to lower risk for future CVA.  -Continue ASA 81 mg, Atorvastatin 40 mg both daily -Increase Lisinopril-HCTZ to 40-25 mg daily (2 tablets of 20-12.5 mg daily) -Patient advised that it is okay to return to work, but not to [employer] himself. He has usually worked 6 days per week but will ask to reduce number of days he works. -Given work note today -Patient to follow up with Neurology Dr. Roda Shutters on 07/05/2015 -Annual CTA/MRA for ectatic aortic arch due in 04/2016

## 2015-06-01 NOTE — Patient Outreach (Signed)
[facility]) Care Management  [facility] Social Work  06/01/2015  Kwinton Maahs [birth year] [id number]  Subjective:    "I will contact you if I need help with applications or understanding paperwork".  Objective:   CSW agreed to be accessible to patient if additional social work needs are identified in the near future, as patient reports no social work needs at present.  Current Medications:  Current Outpatient Prescriptions  Medication Sig Dispense Refill  . aspirin EC 81 MG EC tablet Take 1 tablet (81 mg total) by mouth daily. 30 tablet 3  . atorvastatin (LIPITOR) 40 MG tablet Take 1 tablet (40 mg total) by mouth daily at 6 PM. 30 tablet 3  . lisinopril-hydrochlorothiazide (PRINZIDE,ZESTORETIC) 20-12.5 MG tablet Take 2 tablets by mouth daily. 60 tablet 2   No current facility-administered medications for this visit.    Functional Status:  In your present state of health, do you have any difficulty performing the following activities: 06/01/2015 05/16/2015  Hearing? N N  Vision? N N  Difficulty concentrating or making decisions? N N  Walking or climbing stairs? N N  Dressing or bathing? N N  Doing errands, shopping? N N  Preparing Food and eating ? - N  Using the Toilet? - N  In the past six months, have you accidently leaked urine? - N  Do you have problems with loss of bowel control? - N  Managing your Medications? - N  Managing your Finances? - N  Housekeeping or managing your Housekeeping? - N    Fall/Depression Screening:  PHQ 2/9 Scores 06/01/2015 05/16/2015 05/09/2015 05/09/2015 05/05/2015 05/01/2015 04/26/2015  PHQ - 2 Score 0 0 0 0 0 1 1    Assessment:   CSW was able to make contact with patient today to follow-up regarding social work services and resources, as well as assess need for continued social work involvement with patient.  Patient reported that he has all the resource information he needs, and is currently working with a representative from  [employer] to complete appropriate paperwork to obtain financial benefits.  Patient went on to say that he saw his Primary Care Physician, Dr. Zada Finders this morning and that Dr. Posey Pronto provided him with a release to return to work.  Dr. Posey Pronto is aware that patient was working 6 days per week, but requests that patient try and cut back significantly on his hours.  Patient indicated that he was agreeable. Patient admitted that he has reviewed the list of Senior Housing ([place] [facility] [place] [facility]) options provided to him by CSW, but indicated that he plans to "stay put for now".  Patient realizes there is a great deal of expense that comes along with trying to move, as well as relocate to a different city.  Patient reported that he will keep the list of resources provided to him by CSW to use for future reference.  With his current plan to return to work, patient reported that he will not have time to attend social activities within the community, such as ACE ([place] for [employer]), PACE (Program of [place] for the Elderly), and [employer] [facility] [place]. CSW inquired as to how CSW could be of further assistance to patient at this time.  Patient denied, reporting that he believes he has everything he needs, at present, to be successful. CSW was able to ensure that patient has the correct contact information for CSW, encouraging patient to contact  CSW directly if social work needs arise in the near future.  Patient voiced understanding and was agreeable to this plan.  Patient is aware that Raina Mina, RNCM with [place] [facility], will remain actively involved with patient's care to provide disease management services.  Plan:   CSW will perform a case closure on patient, as all goals of treatment have been met from social work standpoint and no additional social work needs have been identified at  this time. CSW will notify patient's RNCM with [place] [facility], Raina Mina of CSW's plans to close patient's case. CSW will fax a correspondence letter to patient's Primary Care Physician, Dr. Zada Finders to ensure that Dr. Posey Pronto is aware of CSW's involvement with patient.  Nat Christen, BSW, MSW, LCSW  Licensed Education officer, environmental Health System  Mailing [place] [address], [place], [place] [address] Physical Address-[address], [place], [place] [address] Toll Free Main # [phone] Fax # [phone] Cell # [phone]  Fax # [phone]  [email or web address]

## 2015-06-01 NOTE — Patient Instructions (Signed)
Please follow up with Infectious disease Dr. Luciana Axe on 06/15/15 at 9:30 am .  I have sent a prescription to increase your blood pressure medicine Lisinopril-HCTZ 20 -12.5 mg. Please take two of these together daily for a total of 40 - 25 mg daily.  We will check a blood test today to check your CD4 count.  We will refer you for thyroid biopsy.  I have given you a work note.

## 2015-06-01 NOTE — Progress Notes (Signed)
Patient ID: Thomas [name], male   DOB: [birth year], 69 y.o.   MRN: [id number]   Subjective:   Patient ID: Thomas [name] male   DOB: 1945/11/15 69 y.o.   MRN: [id number]  HPI: [name] is a 69 y.o. with PMH of HTN, Enlarged thyroid, CVA, and HIV who presents for follow up for management of these diseases. Patient has been working with social work to [employer] and per note on 05/18/15 is now qualified to receive Medicare Part A, effective 05/17/15 with retro pay for 6 months and Medicare Part B effective January 1st, 2017. He brings a copy of his insurance card today which shows Medicare Part B coverage beginning July 1st, 2017.  Patient's only complaint today is that his blood pressures are still high after increase in Lisnopril-HCTZ from 20-12.5 mg to 20-25 mg. He has a home blood pressure machine and brought a log of daily blood pressures with ranges from 131-164/81-97. He denies any chest pain, SOB, HA, change in vision, lightheadedness, dizziness, numbness/tingling, or other concerning symptoms.  Patient continues Aspirin 81 mg and Atorvastatin 40 mg for his CVA history in addition to the Lisinopril-HCTZ. He feels that his right hand weakness is improving and feels ready to return to work this coming Monday 06/05/15.    Past Medical History  Diagnosis Date  . HTN (hypertension)   . Enlarged thyroid 04/22/2015    Seen on CTA 04/21/15: Contains multiple nodules measuring up to 2.1 cm in size, TSH wnl.  Needs thyroid ultrasound and biopsy  . 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  Recommend 1 year follow up with CT or MRI.   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 aspirin EC 81 MG EC tablet Take 1 tablet (81 mg total) by mouth daily. 30 tablet 3  . atorvastatin (LIPITOR) 40 MG tablet Take 1 tablet (40 mg total) by mouth daily at 6 PM. 30 tablet 3  .  lisinopril-hydrochlorothiazide (PRINZIDE,ZESTORETIC) 20-12.5 MG tablet Take 2 tablets by mouth daily. 60 tablet 2   No current facility-administered medications for this visit.   Family History  Problem Relation Age of Onset  . Hypertension Mother   . Hyperlipidemia Mother    Social History   Social History  . Marital Status: Single    Spouse Name: N/A  . Number of Children: N/A  . Years of Education: N/A   Social History Main Topics  . Smoking status: Never Smoker   . Smokeless tobacco: None  . Alcohol Use: No  . Drug Use: No  . Sexual Activity: Not Asked   Other Topics Concern  . None   Social History Narrative   Review of Systems: Review of Systems  Constitutional: Negative for fever and chills.  Eyes: Negative for blurred vision.  Respiratory: Negative for cough, shortness of breath and wheezing.   Cardiovascular: Negative for chest pain, palpitations and leg swelling.  Gastrointestinal: Negative for nausea, vomiting, abdominal pain, diarrhea and constipation.  Genitourinary: Negative for dysuria.  Musculoskeletal: Negative for myalgias, joint pain and falls.  Neurological: Negative for dizziness, tingling and headaches.       Right hand weakness    Objective:  Physical Exam: Filed Vitals:   06/01/15 0826  BP: 166/87  Pulse: 72  Temp: 97.6 F (36.4 C)  TempSrc: Oral  Resp: 20  Height:  (1.727 m)  Weight:  144 lb 3.2 oz (65.409 kg)  SpO2: 100%   Physical Exam  Constitutional: He is oriented to person, place, and time. He appears well-developed and well-nourished. No distress.  HENT:  Head: Normocephalic and atraumatic.  Neck:  No thyroid nodules palpable on exam. Patient with palpable nodule on right submandibular and left cervical area that are non tender and mobile.  Cardiovascular: Normal rate and regular rhythm.   Pulmonary/Chest: Effort normal and breath sounds normal. No respiratory distress. He has no wheezes.  Abdominal: Soft. There is no  tenderness.  Musculoskeletal: Normal range of motion. He exhibits no edema or tenderness.  Neurological: He is alert and oriented to person, place, and time.  Motor strength normal throughout except for some weakness of right hand. Decreased rapid finger movement on right hand.    Assessment & Plan:  Please see problem based charting for assessment and plan.

## 2015-06-02 LAB — T-HELPER CELLS (CD4) COUNT (NOT AT ARMC)
CD4 T CELL ABS: 190 /uL — AB (ref 400–2700)
CD4 T CELL HELPER: 10 % — AB (ref 33–55)

## 2015-06-02 MED ORDER — SULFAMETHOXAZOLE-TRIMETHOPRIM 800-160 MG PO TABS: 1.0000 | ORAL_TABLET | Freq: Every day | ORAL | Status: DC

## 2015-06-02 NOTE — Addendum Note (Signed)
Addended by: Darreld Mclean R on: 06/02/2015 02:03 PM   Modules accepted: Orders

## 2015-06-05 NOTE — Progress Notes (Signed)
Internal Medicine Clinic Attending  I saw and evaluated the patient.  I personally confirmed the key portions of the history and exam documented by Dr. Patel,Vishal and I reviewed pertinent patient test results.  The assessment, diagnosis, and plan were formulated together and I agree with the documentation in the resident's note.  

## 2015-06-08 ENCOUNTER — Other Ambulatory Visit: Payer: Self-pay | Admitting: *Deleted

## 2015-06-08 NOTE — Patient Outreach (Addendum)
[facility]) Care Management  06/08/2015  Thomas [name] 09/12/1945 [id number]   RN attempted outreach call today to inquire on pt's progress in managing his care however pt not available. RN left a HIPAA approved voice message requesting a call back. Will inquire further at that time on pt's progress. Note pt has indicated that he was returning to work toward the end of September.  Will continue outreach attempts in contacting this pt.  Elliot Cousin, RN Care Management Coordinator [facility] Main Office [phone]

## 2015-06-13 ENCOUNTER — Other Ambulatory Visit: Payer: Self-pay | Admitting: *Deleted

## 2015-06-13 NOTE — Patient Outreach (Signed)
[facility] Care Management  06/13/2015  Thomas [name] 10-Aug-1946 [id number]   RN second attempt to reach pt for ongoing [facility] services however unsuccessful. Left HIPAA approved voice message requested a call back to inquire further. Will evaluate plan of care and goals prior to closing case if pt continues to do well managing his care.   Elliot Cousin, RN Care Management Coordinator [facility] Main Office [phone]

## 2015-06-14 ENCOUNTER — Other Ambulatory Visit: Payer: Self-pay | Admitting: *Deleted

## 2015-06-14 NOTE — Patient Outreach (Signed)
[facility]) Care Management  06/14/2015  Thomas [name] 05/21/1946 [id number]  RN received a call back from pt indicated he gets off of work round 3:00pm and requested RN to follow up with a call a little after that time today. RN will follow up accordingly and inquire further on pt's progress with self management of his HTN and daily BP monitoring.   Elliot CousinLisa Lillieanna Tuohy, RN Care Management Coordinator [facility] Main Office [phone]

## 2015-06-14 NOTE — Patient Outreach (Signed)
[facility]) Care Management  06/14/2015  Thomas NordmannWilliam [name] 07/12/1946 [id number]   RN spoke with pt today and review plan of care and goals pt has been working on over the last few months. Pt reports he has been back at work but he continues to take her BP everyday and reports his readings systolically around 118-124 with no reports symptoms of encountered issues. Pt feels he can continue managing his care now that he is more active with work. Pt reports he has MCR A and B and will have part D soon (Jan). Pt has assistance with this process and states an active date in January. Reports he has been attending all medical appointments and taking all his prescribed medications with no problems.  No other inquires or request at this time as pt declined further community visits due to his scheduled and ongoing progress. RN will alert all involved team member in preparation for closure on this case.   Thomas CousinLisa Orland Visconti, RN Care Management Coordinator [facility] Main Office [phone]

## 2015-06-15 ENCOUNTER — Encounter: Payer: Self-pay | Admitting: Internal Medicine

## 2015-06-15 ENCOUNTER — Encounter: Payer: Self-pay | Admitting: *Deleted

## 2015-06-15 ENCOUNTER — Ambulatory Visit (INDEPENDENT_AMBULATORY_CARE_PROVIDER_SITE_OTHER): Payer: Self-pay | Admitting: Internal Medicine

## 2015-06-15 VITALS — BP 154/94 | HR 76 | Temp 98.4°F | Ht 69.0 in | Wt 140.0 lb

## 2015-06-15 DIAGNOSIS — I639 Cerebral infarction, unspecified: Secondary | ICD-10-CM

## 2015-06-15 DIAGNOSIS — B20 Human immunodeficiency virus [HIV] disease: Secondary | ICD-10-CM

## 2015-06-15 MED ORDER — EMTRICITABINE-TENOFOVIR AF 200-25 MG PO TABS: 1.0000 | ORAL_TABLET | Freq: Every day | ORAL | Status: DC

## 2015-06-15 MED ORDER — DOLUTEGRAVIR SODIUM 50 MG PO TABS: 50.0000 mg | ORAL_TABLET | Freq: Every day | ORAL | Status: DC

## 2015-06-15 NOTE — Progress Notes (Signed)
Patient ID: Thomas NordmannWilliam Hand, male    DOB: 09/25/1945, 69 y.o.   MRN: [id number]  HPI:   Here to establish care for HIV.  Recently with a CVA and noted hypertensive disease and screened for HIV and was positive.  CD4 of 190, viral load of 15,170.  RPR negative.  Does not recall every being tested so unknown duration.  Denies MSM activity.  Never used drugs.  No weight loss, no diarrhea.  No history of previous hospitalizations, no history of GC, chlamydia, syphilis.  No history of OIs.    Past Medical History  Diagnosis Date  . HTN (hypertension)   . Enlarged thyroid 04/22/2015    Seen on CTA 04/21/15: Contains multiple nodules measuring up to 2.1 cm in size, TSH wnl.  Needs thyroid ultrasound and biopsy  . 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  Recommend 1 year follow up with CT or MRI.   Marland Kitchen. 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 Authorizing Provider  aspirin EC 81 MG EC tablet Take 1 tablet (81 mg total) by mouth daily. 04/21/15  Yes Darreld McleanVishal Patel, MD  atorvastatin (LIPITOR) 40 MG tablet Take 1 tablet (40 mg total) by mouth daily at 6 PM. 04/21/15  Yes Darreld McleanVishal Patel, MD  lisinopril-hydrochlorothiazide (PRINZIDE,ZESTORETIC) 20-12.5 MG tablet Take 2 tablets by mouth daily. 06/01/15  Yes Darreld McleanVishal Patel, MD  sulfamethoxazole-trimethoprim (BACTRIM DS,SEPTRA DS) 800-160 MG tablet Take 1 tablet by mouth daily. 06/02/15  Yes Darreld McleanVishal Patel, MD  dolutegravir (TIVICAY) 50 MG tablet Take 1 tablet (50 mg total) by mouth daily. 06/15/15   Gardiner Barefootobert W Tama Grosz, MD  emtricitabine-tenofovir AF (DESCOVY) 200-25 MG tablet Take 1 tablet by mouth daily. 06/15/15   Gardiner Barefootobert W Halli Equihua, MD    No Known Allergies  Social History  Substance Use Topics  . Smoking status: Never Smoker   . Smokeless tobacco: None  . Alcohol Use: No    Family History  Problem Relation Age of Onset  . Hypertension  Mother   . Hyperlipidemia Mother     Review of Systems A comprehensive review of systems was negative except for: Hematologic/lymphatic: positive for cervical lymph nodes   Filed Vitals:   06/15/15 0855  BP: 154/94  Pulse: 76  Temp: 98.4 F (36.9 C)   GEN:in no apparent distress and alert HEENT: anicteric CARD:Cor RRR and No murmurs LUNG:clear NW:GNFAOGI:Bowel sounds are normal, liver is not enlarged, spleen is not enlarged ZHY:QMVHQIONGEEXT:peripheral pulses normal, no pedal edema, no clubbing or cyanosis SKIN: negative for - jaundice, spider hemangioma, telangiectasia, palmar erythema, ecchymosis and atrophy MS: no joint swelling  No results found for: HIV1RNAQUANT No components found for: HIV1GENOTYPRPLUS No components found for: THELPERCELL  Assessment: new HIV patient.  Discussed with patient treatment options and side effects, benefits of treatment, long term outcomes, medication interactions.  I discussed the severity of untreated HIV including higher cancer risk, opportunistic infections, renal failure.  Also discussed needing to use condoms, partner disclosure, necessary vaccines, blood monitoring.  All questions answered.    Plan: 1) ADAP 2) start Tivicay and Descovy once ADAP approved 3) Bctrim OI prophylaxis 4) labs 4 weeks after starting medication 5) RTC 6 weeks and discuss regimen

## 2015-06-15 NOTE — Progress Notes (Signed)
This encounter was created in error - please disregard.

## 2015-06-22 ENCOUNTER — Encounter (HOSPITAL_COMMUNITY): Payer: Self-pay

## 2015-06-22 ENCOUNTER — Emergency Department (HOSPITAL_COMMUNITY)
Admission: EM | Admit: 2015-06-22 | Discharge: 2015-06-22 | Disposition: A | Discharge status: Home / Self Care | Payer: Medicare Other | Attending: Physician Assistant | Admitting: Physician Assistant

## 2015-06-22 DIAGNOSIS — I10 Essential (primary) hypertension: Secondary | ICD-10-CM | POA: Insufficient documentation

## 2015-06-22 DIAGNOSIS — R1111 Vomiting without nausea: Secondary | ICD-10-CM | POA: Diagnosis not present

## 2015-06-22 DIAGNOSIS — Z7982 Long term (current) use of aspirin: Secondary | ICD-10-CM | POA: Insufficient documentation

## 2015-06-22 DIAGNOSIS — Z8673 Personal history of transient ischemic attack (TIA), and cerebral infarction without residual deficits: Secondary | ICD-10-CM | POA: Insufficient documentation

## 2015-06-22 DIAGNOSIS — Z79899 Other long term (current) drug therapy: Secondary | ICD-10-CM | POA: Insufficient documentation

## 2015-06-22 HISTORY — DX: Human immunodeficiency virus (HIV) disease: B20

## 2015-06-22 HISTORY — DX: Asymptomatic human immunodeficiency virus (hiv) infection status: Z21

## 2015-06-22 HISTORY — DX: Immunodeficiency, unspecified: D84.9

## 2015-06-22 LAB — COMPREHENSIVE METABOLIC PANEL
ALK PHOS: 77 U/L (ref 38–126)
ALT: 25 U/L (ref 17–63)
ANION GAP: 8 (ref 5–15)
AST: 33 U/L (ref 15–41)
Albumin: 3.1 g/dL — ABNORMAL LOW (ref 3.5–5.0)
BILIRUBIN TOTAL: 0.6 mg/dL (ref 0.3–1.2)
BUN: 37 mg/dL — ABNORMAL HIGH (ref 6–20)
CALCIUM: 8.9 mg/dL (ref 8.9–10.3)
CO2: 27 mmol/L (ref 22–32)
CREATININE: 2.11 mg/dL — AB (ref 0.61–1.24)
Chloride: 101 mmol/L (ref 101–111)
GFR, EST AFRICAN AMERICAN: 35 mL/min — AB (ref >60)
GFR, EST NON AFRICAN AMERICAN: 30 mL/min — AB (ref >60)
Glucose, Bld: 98 mg/dL (ref 65–99)
Potassium: 4.5 mmol/L (ref 3.5–5.1)
SODIUM: 136 mmol/L (ref 135–145)
TOTAL PROTEIN: 7.1 g/dL (ref 6.5–8.1)

## 2015-06-22 LAB — CBC WITH DIFFERENTIAL/PLATELET
Basophils Absolute: 0 10*3/uL (ref 0.0–0.1)
Basophils Relative: 0 %
EOS ABS: 0.1 10*3/uL (ref 0.0–0.7)
Eosinophils Relative: 4 %
HEMATOCRIT: 35.8 % — AB (ref 39.0–52.0)
HEMOGLOBIN: 11.8 g/dL — AB (ref 13.0–17.0)
LYMPHS ABS: 1 10*3/uL (ref 0.7–4.0)
LYMPHS PCT: 30 %
MCH: 27 pg (ref 26.0–34.0)
MCHC: 33 g/dL (ref 30.0–36.0)
MCV: 81.9 fL (ref 78.0–100.0)
MONOS PCT: 16 %
Monocytes Absolute: 0.6 10*3/uL (ref 0.1–1.0)
NEUTROS PCT: 50 %
Neutro Abs: 1.7 10*3/uL (ref 1.7–7.7)
Platelets: 95 10*3/uL — ABNORMAL LOW (ref 150–400)
RBC: 4.37 MIL/uL (ref 4.22–5.81)
RDW: 13.9 % (ref 11.5–15.5)
WBC: 3.4 10*3/uL — AB (ref 4.0–10.5)

## 2015-06-22 LAB — LIPASE, BLOOD: LIPASE: 31 U/L (ref 11–51)

## 2015-06-22 LAB — TSH: TSH: 0.646 u[IU]/mL (ref 0.350–4.500)

## 2015-06-22 MED ORDER — ONDANSETRON HCL 4 MG PO TABS: 4.0000 mg | ORAL_TABLET | Freq: Three times a day (TID) | ORAL | Status: DC | PRN

## 2015-06-22 MED ORDER — SODIUM CHLORIDE 0.9 % IV BOLUS (SEPSIS)
1000.0000 mL | Freq: Once | INTRAVENOUS | Status: AC
  Administered 2015-06-22: 1000 mL via INTRAVENOUS

## 2015-06-22 NOTE — ED Notes (Signed)
Meal tray ordered for pt  

## 2015-06-22 NOTE — Discharge Instructions (Signed)
We will need to follow up with your regular physician this week. We're worried that he may need more of a workup for your weight loss.   Nausea and Vomiting Nausea is a sick feeling that often comes before throwing up (vomiting). Vomiting is a reflex where stomach contents come out of your mouth. Vomiting can cause severe loss of body fluids (dehydration). Children and elderly adults can become dehydrated quickly, especially if they also have diarrhea. Nausea and vomiting are symptoms of a condition or disease. It is important to find the cause of your symptoms. CAUSES   Direct irritation of the stomach lining. This irritation can result from increased acid production (gastroesophageal reflux disease), infection, food poisoning, taking certain medicines (such as nonsteroidal anti-inflammatory drugs), alcohol use, or tobacco use.  Signals from the brain.These signals could be caused by a headache, heat exposure, an inner ear disturbance, increased pressure in the brain from injury, infection, a tumor, or a concussion, pain, emotional stimulus, or metabolic problems.  An obstruction in the gastrointestinal tract (bowel obstruction).  Illnesses such as diabetes, hepatitis, gallbladder problems, appendicitis, kidney problems, cancer, sepsis, atypical symptoms of a heart attack, or eating disorders.  Medical treatments such as chemotherapy and radiation.  Receiving medicine that makes you sleep (general anesthetic) during surgery. DIAGNOSIS Your caregiver may ask for tests to be done if the problems do not improve after a few days. Tests may also be done if symptoms are severe or if the reason for the nausea and vomiting is not clear. Tests may include:  Urine tests.  Blood tests.  Stool tests.  Cultures (to look for evidence of infection).  X-rays or other imaging studies. Test results can help your caregiver make decisions about treatment or the need for additional tests. TREATMENT You  need to stay well hydrated. Drink frequently but in small amounts.You may wish to drink water, sports drinks, clear broth, or eat frozen ice pops or gelatin dessert to help stay hydrated.When you eat, eating slowly may help prevent nausea.There are also some antinausea medicines that may help prevent nausea. HOME CARE INSTRUCTIONS   Take all medicine as directed by your caregiver.  If you do not have an appetite, do not force yourself to eat. However, you must continue to drink fluids.  If you have an appetite, eat a normal diet unless your caregiver tells you differently.  Eat a variety of complex carbohydrates (rice, wheat, potatoes, bread), lean meats, yogurt, fruits, and vegetables.  Avoid high-fat foods because they are more difficult to digest.  Drink enough water and fluids to keep your urine clear or pale yellow.  If you are dehydrated, ask your caregiver for specific rehydration instructions. Signs of dehydration may include:  Severe thirst.  Dry lips and mouth.  Dizziness.  Dark urine.  Decreasing urine frequency and amount.  Confusion.  Rapid breathing or pulse. SEEK IMMEDIATE MEDICAL CARE IF:   You have blood or brown flecks (like coffee grounds) in your vomit.  You have black or bloody stools.  You have a severe headache or stiff neck.  You are confused.  You have severe abdominal pain.  You have chest pain or trouble breathing.  You do not urinate at least once every 8 hours.  You develop cold or clammy skin.  You continue to vomit for longer than 24 to 48 hours.  You have a fever. MAKE SURE YOU:   Understand these instructions.  Will watch your condition.  Will get help right away if  you are not doing well or get worse.   This information is not intended to replace advice given to you by your health care provider. Make sure you discuss any questions you have with your health care provider.   Document Released: 08/19/2005 Document Revised:  11/11/2011 Document Reviewed: 01/16/2011 Elsevier Interactive Patient Education [employer]2016 Elsevier Inc.

## 2015-06-22 NOTE — ED Provider Notes (Signed)
CSN: [id number]     Arrival date & time 06/22/15  0809 History   First MD Initiated Contact with Patient 06/22/15 [phone]     Chief Complaint  Patient presents with  . Emesis     (Consider location/radiation/quality/duration/timing/severity/associated sxs/prior Treatment) HPI   Patient is a very pleasant 69 year old male with history of stroke in August presenting today with 1 episode of vomiting. Patient reports that he took his medications and breakfast this morning vomited once. He has no nausea no diarrhea no fever. He feels back to normal. He says he went to  work but they told him to come here to emergency department to get a note to go back to work.   Patient is accompaignied by family members. They state that he's been really not thriving at home. Has not been eating, not been sleeping, loss of interest in things. I'm concerned about depression in this male. He denies SI HI. He has a primary care physician, Dr. Allena KatzPatel. I recommended that the family members accompany him to a follow-up appointment with his doctor if everything checks out today.    Past Medical History  Diagnosis Date  . HTN (hypertension)   . Enlarged thyroid 04/22/2015    Seen on CTA 04/21/15: Contains multiple nodules measuring up to 2.1 cm in size, TSH wnl.  Needs thyroid ultrasound and biopsy  . 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  Recommend 1 year follow up with CT or MRI.   Marland Kitchen. 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 No pertinent past surgical history. Family History  Problem Relation Age of Onset  . Hypertension Mother   . Hyperlipidemia Mother    Social History  Substance Use Topics  . Smoking status: Never Smoker   . Smokeless tobacco: None  . Alcohol Use: No    Review of Systems  Constitutional: Negative for fever and activity change.  HENT: Negative for drooling and hearing loss.   Eyes: Negative for discharge and  redness.  Respiratory: Negative for cough and shortness of breath.   Cardiovascular: Negative for chest pain.  Gastrointestinal: Positive for vomiting. Negative for abdominal pain.  Genitourinary: Negative for dysuria and urgency.  Musculoskeletal: Negative for arthralgias.  Allergic/Immunologic: Negative for immunocompromised state.  Neurological: Negative for seizures and speech difficulty.  Psychiatric/Behavioral: Positive for dysphoric mood and decreased concentration. Negative for suicidal ideas, behavioral problems and sleep disturbance.  All other systems reviewed and are negative.     Allergies  Review of patient's allergies indicates no known allergies.  Home Medications   Prior to Admission medications   Medication Sig Start Date End Date Taking? Authorizing Provider  aspirin EC 81 MG EC tablet Take 1 tablet (81 mg total) by mouth daily. 04/21/15   Darreld McleanVishal Patel, MD  atorvastatin (LIPITOR) 40 MG tablet Take 1 tablet (40 mg total) by mouth daily at 6 PM. 04/21/15   Darreld McleanVishal Patel, MD  dolutegravir (TIVICAY) 50 MG tablet Take 1 tablet (50 mg total) by mouth daily. 06/15/15   Gardiner Barefootobert W Comer, MD  emtricitabine-tenofovir AF (DESCOVY) 200-25 MG tablet Take 1 tablet by mouth daily. 06/15/15   Gardiner Barefootobert W Comer, MD  lisinopril-hydrochlorothiazide (PRINZIDE,ZESTORETIC) 20-12.5 MG tablet Take 2 tablets by mouth daily. 06/01/15   Darreld McleanVishal Patel, MD  sulfamethoxazole-trimethoprim (BACTRIM DS,SEPTRA DS) 800-160 MG tablet Take 1 tablet by mouth daily. 06/02/15   Darreld McleanVishal Patel, MD   BP 123/79 mmHg  Pulse  60  Temp(Src) 98 F (36.7 C) (Oral)  Resp 20  SpO2 99% Physical Exam  Constitutional: He is oriented to person, place, and time. He appears well-nourished.  Thin African American male  HENT:  Head: Normocephalic.  Mouth/Throat: Oropharynx is clear and moist.  Eyes: Conjunctivae are normal.  Neck: No tracheal deviation present.  Cardiovascular: Normal rate.   Pulmonary/Chest: Effort normal.  No stridor. No respiratory distress.  Abdominal: Soft. There is no tenderness. There is no guarding.  Musculoskeletal: Normal range of motion. He exhibits no edema.  Neurological: He is oriented to person, place, and time. No cranial nerve deficit.  Skin: Skin is warm and dry. No rash noted. He is not diaphoretic.  Psychiatric: His behavior is normal.  Nursing note and vitals reviewed.   ED Course  Procedures (including critical care time) Labs Review Labs Reviewed  CBC WITH DIFFERENTIAL/PLATELET  COMPREHENSIVE METABOLIC PANEL  LIPASE, BLOOD    Imaging Review No results found. I have personally reviewed and evaluated these images and lab results as part of my medical decision-making.   EKG Interpretation   Date/Time:  Thursday June 22 2015 09:11:29 EDT Ventricular Rate:  68 PR Interval:  165 QRS Duration: 97 QT Interval:  415 QTC Calculation: 441 R Axis:   -45 Text Interpretation:  Sinus rhythm Ventricular premature complex Left  anterior fascicular block Minimal ST elevation, anterior leads No  significant change since last tracing Confirmed by Kandis Mannan  [phone]) on 06/22/2015 9:18:00 AM      MDM   Final diagnoses:  None    Patient is a 69 year old male presenting today with 1 episode of vomiting. No fever, no abdominal pain, no diarrhea, I don't know there is much to do for this one episode today. We will get labs to make sure patient is not dehydrated. Family members are concerned because he's been eating less than normal sleeping less than normal and not his normal self. I'm concerned about depression in this male. We talked to family members and patient about following up with Dr. Allena Katz this week to further explore that idea.     Asaiah Hunnicutt Randall An, MD 06/22/15 [phone]

## 2015-06-22 NOTE — ED Notes (Signed)
Pt reports one episode of vomiting this morning.  Pt denies any abdominal pain or nausea.  Pt also c/o chronic back pain.  Pt family is concerned because pt has not been able "to taste anything for about a month so he won't eat.  We just want some answers as to why he can't taste anything."  Family reports pt suffered a stroke in August.

## 2015-06-26 ENCOUNTER — Ambulatory Visit (INDEPENDENT_AMBULATORY_CARE_PROVIDER_SITE_OTHER): Payer: Self-pay | Admitting: Internal Medicine

## 2015-06-26 ENCOUNTER — Encounter: Payer: Self-pay | Admitting: Internal Medicine

## 2015-06-26 ENCOUNTER — Other Ambulatory Visit: Payer: Self-pay | Admitting: *Deleted

## 2015-06-26 VITALS — BP 126/72 | HR 74 | Temp 98.1°F | Wt 139.0 lb

## 2015-06-26 DIAGNOSIS — F4320 Adjustment disorder, unspecified: Secondary | ICD-10-CM | POA: Insufficient documentation

## 2015-06-26 DIAGNOSIS — B20 Human immunodeficiency virus [HIV] disease: Secondary | ICD-10-CM

## 2015-06-26 DIAGNOSIS — N179 Acute kidney failure, unspecified: Secondary | ICD-10-CM

## 2015-06-26 DIAGNOSIS — F4321 Adjustment disorder with depressed mood: Secondary | ICD-10-CM

## 2015-06-26 DIAGNOSIS — Z21 Asymptomatic human immunodeficiency virus [HIV] infection status: Secondary | ICD-10-CM

## 2015-06-26 LAB — BASIC METABOLIC PANEL
ANION GAP: 6 (ref 5–15)
BUN: 22 mg/dL — ABNORMAL HIGH (ref 6–20)
CALCIUM: 8.8 mg/dL — AB (ref 8.9–10.3)
CO2: 28 mmol/L (ref 22–32)
Chloride: 100 mmol/L — ABNORMAL LOW (ref 101–111)
Creatinine, Ser: 1.24 mg/dL (ref 0.61–1.24)
GFR calc Af Amer: >60 mL/min (ref >60)
GFR, EST NON AFRICAN AMERICAN: 58 mL/min — AB (ref >60)
GLUCOSE: 87 mg/dL (ref 65–99)
Potassium: 3.9 mmol/L (ref 3.5–5.1)
SODIUM: 134 mmol/L — AB (ref 135–145)

## 2015-06-26 NOTE — Assessment & Plan Note (Signed)
Patient having some depressed mood, loss of interest, low appetite and low PO intake. This is getting better per patient. 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   Will not treat at this point. Hopefully he will continue to improve. Asked him to let us know if he needs help with depressed mood.

## 2015-06-26 NOTE — Patient Instructions (Addendum)
You are doing well. We will check your kidney function again to make sure it gets better.  Make sure you have follow up appointment scheduled with Dr. Luciana Axeomer. Call their office.  Follow up with us in 6 months. Call us sooner if you are having uncontrolled depression or any other problems.

## 2015-06-26 NOTE — Assessment & Plan Note (Addendum)
Had CMET at ED showing crt of 2.11, with BUN of 31. Baseline crt is about 1.10. This is likely 2/2 to vomitting and poor PO intake. Did receive IVF in the ED.  His ACEI-HCTZ was not held despite the AKI by ED. He already took his dose today. He stated not starting his Bactrim yet.   Will repeat BMET today to make sure AKI resolved. Does not look volume depleted on exam currently, no dizziness upon standing.  If not improving, then will ask him to hold his ACEi-HCTZ.   Addendum: Crt improved, not quite to baseline but at 1.2 range now. Called patient and told him about the improvement of kidney function and asked him to start taking Bactrim ppx.

## 2015-06-26 NOTE — Assessment & Plan Note (Signed)
Being followed by Dr. Luciana Axeomer. Waiting for approval by drug assistance program, with plan to start Tivicay and Descovy after that. Was put on bactrim for ppx as his CD4 was 190. He hasn't started taking it yet, stating "they have not put me on it yet".  Will make sure patient's AKI is better, if it is, then will ask him to start taking bactrim. Asked to call ID office to make f/up appt with Dr. Suzi Rootsomert.

## 2015-06-26 NOTE — Patient Outreach (Signed)
[facility] Care Management  06/26/2015  Hazle NordmannWilliam Tse 05/24/1946 [id number]  RN received information that this pt [facility] a stress test for today in order for him to continue to work at his job. RN returned the call to this pt and informed pt to contact his provider with this request. Pt verbalized an understanding and will follow up with his provider or cardiologist. RN explained that orders are required from his provider to proceed with this request. RN also [facility].  Pt aware if he has any concerns to contact his provider. Note [facility]. This case again has been closed however [facility] for additional resources if needed.  Elliot CousinLisa Ismail Graziani, RN Care Management Coordinator [facility] Main Office [phone]

## 2015-06-26 NOTE — Progress Notes (Signed)
   Subjective:    Patient ID: Thomas [name], male    DOB: 08/17/1946, 69 y.o.   MRN: [id number]  HPI  69 yo male with hx of ectatic aortic arch, CVA, HTN, newly diagnosed HIV who is here for ED follow up.  [facility], last cd4 190, viral load 15,170. ADAP processed was started at ID clinic, and was planning to start Tivicay and Descovy after ADAP is approved. Also was put on bactrim ppx, has RTC appt wit Dr. Luciana Axeomer in 6 weeks.  Patient was seen at the ED on 06/22/15 for 1x episode of vomiting. Also had poor appetite, loss of interest, and concern for depression was raised by the ED provider.   Labs showed BUN 37 crt 2.11, which was higher than his crt from 2 months ago of 1.10.    wbc 3.4, hgb 11.8, plt 95. LFTs normal,   Refused flu shot today.  See problem based a&p.   Review of Systems  Constitutional: Negative for fever, chills and fatigue.  HENT: Negative for congestion, rhinorrhea and sore throat.   Eyes: Negative for photophobia and visual disturbance.  Respiratory: Negative for cough, chest tightness, shortness of breath and wheezing.   Cardiovascular: Negative for chest pain and leg swelling.  Gastrointestinal: Negative for nausea, vomiting, abdominal pain, diarrhea, constipation and abdominal distention.  Genitourinary: Negative for dysuria and difficulty urinating.  Musculoskeletal: Negative for back pain, arthralgias and gait problem.  Skin: Negative.   Neurological: Negative for dizziness and headaches.  Psychiatric/Behavioral: Negative for suicidal ideas, self-injury and agitation. The patient is not nervous/anxious.        Objective:   Physical Exam  Constitutional: He is oriented to person, place, and time. He appears well-developed. No distress.  Thin african [place] male. Well dressed.   HENT:  Head: Normocephalic and atraumatic.  Mouth/Throat: Oropharynx is clear and moist. No oropharyngeal exudate.  Has right sided facial  droop from old CVA.   Eyes: Conjunctivae are normal. Pupils are equal, round, and reactive to light.  Neck: Normal range of motion. Neck supple.  Cardiovascular: Normal rate and regular rhythm.   Has systolic murmur  Pulmonary/Chest: Effort normal and breath sounds normal. No respiratory distress. He has no wheezes. He has no rales. He exhibits no tenderness.  Musculoskeletal: Normal range of motion. He exhibits no edema or tenderness.  Neurological: He is alert and oriented to person, place, and time. No cranial nerve deficit.  Skin: Skin is warm. He is not diaphoretic.  Psychiatric: He has a normal mood and affect.    Filed Vitals:   06/26/15 1507  BP: 126/72  Pulse: 74  Temp: 98.1 F (36.7 C)         Assessment & Plan:  See problem based a&p.

## 2015-06-28 ENCOUNTER — Other Ambulatory Visit: Payer: Self-pay | Admitting: Internal Medicine

## 2015-06-28 NOTE — Progress Notes (Signed)
Internal Medicine Clinic Attending  Case discussed with Dr. Ahmed at the time of the visit.  We reviewed the resident's history and exam and pertinent patient test results.  I agree with the assessment, diagnosis, and plan of care documented in the resident's note. 

## 2015-06-29 NOTE — Progress Notes (Signed)
This encounter was created in error - please disregard.

## 2015-07-03 ENCOUNTER — Other Ambulatory Visit: Payer: Self-pay | Admitting: Internal Medicine

## 2015-07-03 DIAGNOSIS — E049 Nontoxic goiter, unspecified: Secondary | ICD-10-CM

## 2015-07-05 ENCOUNTER — Ambulatory Visit (INDEPENDENT_AMBULATORY_CARE_PROVIDER_SITE_OTHER): Payer: Self-pay | Admitting: Neurology

## 2015-07-05 ENCOUNTER — Encounter: Payer: Self-pay | Admitting: Neurology

## 2015-07-05 VITALS — BP 128/85 | HR 65 | Ht 69.0 in | Wt 139.0 lb

## 2015-07-05 DIAGNOSIS — B20 Human immunodeficiency virus [HIV] disease: Secondary | ICD-10-CM

## 2015-07-05 DIAGNOSIS — I639 Cerebral infarction, unspecified: Secondary | ICD-10-CM

## 2015-07-05 DIAGNOSIS — I10 Essential (primary) hypertension: Secondary | ICD-10-CM

## 2015-07-05 DIAGNOSIS — I63312 Cerebral infarction due to thrombosis of left middle cerebral artery: Secondary | ICD-10-CM

## 2015-07-05 HISTORY — DX: Cerebral infarction due to thrombosis of left middle cerebral artery: I63.312

## 2015-07-05 NOTE — Progress Notes (Signed)
STROKE NEUROLOGY FOLLOW UP NOTE  NAME: Thomas [name] DOB: 07/01/1946  REASON FOR VISIT: stroke follow up HISTORY FROM: pt and chart  Today we had the pleasure of seeing Thomas [name] in follow-up at our Neurology Clinic. Pt was accompanied by no one.   History Summary Thomas [name] is a 69 y.o. male with history of hypertension was admitted on 04/20/15 for right facial droop and slurred speech. 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 CTA showed no large vessel occlusion but left 4mm PCOM aneurysm. TTE showed EF 50-55%. LDL 79 and A1c 6.0. 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 RPR was negative. He was put on ASA and lipitor and discharged with outpt follow up  Interval History During the interval time, the patient has been doing well from stroke standpoint. No recurrent stroke like symptoms. He follows with PCP for HTN and increased dose of lisinopril/HCTZ, currently in good control. Today BP 128/85. He also follows with ID Dr. Luciana Axeomer and is about to start HARRT therapy pending insurance approval. His Cre also improved well. No other complains.    REVIEW OF SYSTEMS: Full 14 system review of systems performed and notable only for those listed below and in HPI above, all others are negative:  Constitutional:   Cardiovascular: murmur Ear/Nose/Throat:   Skin:  Eyes:   Respiratory:   Gastroitestinal:   Genitourinary:  Hematology/Lymphatic:   Endocrine:  Musculoskeletal:   Allergy/Immunology:   Neurological:   Psychiatric:  Sleep:   The following represents the patient's updated allergies and side effects list: No Known Allergies  The neurologically relevant items on the patient's problem list were reviewed on today's visit.  Neurologic Examination  A problem focused neurological exam (12 or more points of the single system neurologic examination, vital signs counts as 1 point, cranial  nerves count for 8 points) was performed.  Blood pressure 128/85, pulse 65, height 5\' 9"  (1.753 m), weight 139 lb (63.05 kg).  General - Well nourished, well developed, in no apparent distress.  Ophthalmologic - Sharp disc margins OU.   Cardiovascular - Regular rate and rhythm with no murmur.  Mental Status -  Level of arousal and orientation to time, place, and person were intact. Language including expression, naming, repetition, comprehension was assessed and found intact. Fund of Knowledge was assessed and was intact.  Cranial Nerves II - XII - II - Visual field intact OU. III, IV, VI - Extraocular movements intact. V - Facial sensation intact bilaterally. VII - right mild flattening of nasolabial fold. VIII - Hearing & vestibular intact bilaterally. X - Palate elevates symmetrically.Marland Kitchen. XI - Chin turning & shoulder shrug intact bilaterally XII - Tongue protrusion intact.  Motor Strength - The patient's strength was normal in all extremities and pronator drift was absent. Bulk was normal and fasciculations were absent.  Motor Tone - Muscle tone was assessed at the neck and appendages and was normal.  Reflexes - The patient's reflexes were 1+ in all extremities and he had no pathological reflexes.  Sensory - Light touch, temperature/pinprick were assessed and were symmetrical.   Coordination - The patient had normal movements in the hands and feet with no ataxia or dysmetria. Tremor was absent.  Gait and Station - The patient's transfers, posture, gait, station, and turns were observed as normal.  Data reviewed: I personally reviewed the images and agree with the radiology interpretations.  Ct Angio Head W/cm &/  or Wo Cm 04/20/2015  1. No intracranial medium or large vessel occlusion or stenosis.  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  3. No cervical carotid or vertebral artery stenosis.  4. Ectatic aortic arch. Recommend annual imaging  followup by CTA or MRA.  Dg Chest 2 View 04/20/2015  Borderline cardiac enlargement and probable mild bronchitic lung changes but no acute pulmonary findings.   Ct Head Wo Contrast 04/20/2015  Patchy supratentorial small vessel disease.  There is a degree of increased attenuation in a portion of the left middle cerebral artery compared to the right.  The significance of this finding is uncertain.  This finding potentially could indicate the earliest changes of a left middle cerebral artery distribution infarct.  No hemorrhage or mass effect.  Areas of paranasal sinus disease.   Mr Brain Wo Contrast 04/20/2015  Moderate-sized area of restricted diffusion affects the LEFT centrum semiovale, perhaps involving the superior portion of the LEFT lentiform nucleus, consistent with an acute LEFT MCA lenticulostriate territory infarct.  No hemorrhagic transformation.  No visible large vessel occlusion as correlated with recent CTA and based on intracranial flow voids.  Sequelae of hypertensive cerebral vascular disease, with multiple areas of chronic hemorrhage in the brain as well as moderately advanced small vessel disease.   2D echo - - Left ventricle: The cavity size was normal. Wall thickness was increased in a pattern of moderate LVH. Systolic function was normal. The estimated ejection fraction was in the range of 50% to 55%. Wall motion was normal; there were no regional wall motion abnormalities. Doppler parameters are consistent with abnormal left ventricular relaxation (grade 1 diastolic dysfunction). - Aortic valve: There was mild regurgitation. - Left atrium: The atrium was moderately dilated. - Right atrium: The atrium was moderately dilated.  Impressions: - Normal LV systolic function; grade 1 diastolic dysfunction; moderate biatrial enlargement; calcified aortic valve with mild eccentric AI; trace MR and TR.  Component     Latest Ref  Rng 04/20/2015 04/21/2015 04/26/2015 05/05/2015  Cholesterol     0 - 200 mg/dL  161    Triglycerides     <150 mg/dL  36    HDL Cholesterol     >40 mg/dL  29 (L)    Total CHOL/HDL Ratio       4.0    VLDL     0 - 40 mg/dL  7    LDL (calc)     0 - 99 mg/dL  79    HIV 1 AB     Negative Positive (A)     HIV 2 AB     Negative Negative     Note      Comment     Hemoglobin A1C     4.8 - 5.6 %  6.0 (H)    Mean Plasma Glucose       126    HIV-1 RNA Viral Load         15710  HIV-1 RNA Viral Load Log         4.196  HIV     Non Reactive Comment     TSH     0.350 - 4.500 uIU/mL 1.496   0.823  RPR     Non Reactive   Non Reactive   T4, Total     4.5 - 12.0 ug/dL    8.8    Assessment: As you may recall, he is a 69 y.o. African American male with PMH of hypertension was  admitted on 04/20/15 for left BG/CR infarct, size somehow larger than typical lacunar infarct. CTA showed no large vessel occlusion but left 4mm PCOM aneurysm. TTE showed EF 50-55%. LDL 79 and A1c 6.0. 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 RPR was negative. 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 He was put on ASA and lipitor. During the interval time, the patient has been doing well from stroke standpoint. He also follows ID for HARRT therapy. Follows with PCP for HTN and AKI, both much improved.  Plan:  - continue ASA and lipitor for stroke prevention - follow up ID and IM for HTN and HIV management - check BP at home and record - Follow up with your primary care physician for stroke risk factor modification. Recommend maintain blood pressure goal <130/80, diabetes with hemoglobin A1c goal below 6.5% and lipids with LDL cholesterol goal below 70 mg/dL.  - will repeat CTA head and neck in one year to follow up with left PCOM aneurysm and aortic arch  - follow up in 4 months.  I spent more than 25 minutes of face to face time with the patient. Greater than 50% of time was spent in counseling  and coordination of care.   No orders of the defined types were placed in this encounter.    No orders of the defined types were placed in this encounter.    Patient Instructions  - continue ASA and lipitor for stroke prevention - follow up ID and IM for medical management - check BP at home and record - Follow up with your primary care physician for stroke risk factor modification. Recommend maintain blood pressure goal <130/80, diabetes with hemoglobin A1c goal below 6.5% and lipids with LDL cholesterol goal below 70 mg/dL.  - will do brain vessel imaging in one year to follow up with left PCOM aneurysm - follow up in 4 months.    Marvel Plan, MD PhD [facility] [address] [place], [place] [address] [phone]

## 2015-07-05 NOTE — Patient Instructions (Addendum)
-   continue ASA and lipitor for stroke prevention - follow up ID and IM for medical management - check BP at home and record - Follow up with your primary care physician for stroke risk factor modification. Recommend maintain blood pressure goal <130/80, diabetes with hemoglobin A1c goal below 6.5% and lipids with LDL cholesterol goal below 70 mg/dL.  - will do brain vessel imaging in one year to follow up with left PCOM aneurysm - follow up in 4 months.

## 2015-07-13 ENCOUNTER — Other Ambulatory Visit: Payer: Self-pay | Admitting: Internal Medicine

## 2015-07-13 DIAGNOSIS — E049 Nontoxic goiter, unspecified: Secondary | ICD-10-CM

## 2015-07-24 ENCOUNTER — Ambulatory Visit (HOSPITAL_COMMUNITY): Payer: Medicare Other

## 2015-07-25 ENCOUNTER — Ambulatory Visit (HOSPITAL_COMMUNITY)
Admission: RE | Admit: 2015-07-25 | Discharge: 2015-07-25 | Disposition: A | Discharge status: Home / Self Care | Payer: Medicare Other | Source: Ambulatory Visit | Attending: Internal Medicine | Admitting: Internal Medicine

## 2015-07-25 DIAGNOSIS — R591 Generalized enlarged lymph nodes: Secondary | ICD-10-CM | POA: Insufficient documentation

## 2015-07-25 DIAGNOSIS — E049 Nontoxic goiter, unspecified: Secondary | ICD-10-CM

## 2015-07-25 DIAGNOSIS — E042 Nontoxic multinodular goiter: Secondary | ICD-10-CM | POA: Insufficient documentation

## 2015-08-08 ENCOUNTER — Inpatient Hospital Stay: Admission: RE | Admit: 2015-08-08 | Payer: Medicare Other | Source: Ambulatory Visit

## 2015-08-10 ENCOUNTER — Telehealth: Payer: Self-pay | Admitting: *Deleted

## 2015-08-10 NOTE — Telephone Encounter (Signed)
Pt is not receiving HIV medications from [facility] in [place].  Not receiving any medications from [facility] listed.  Requested pt call [facility] about his HIV medications.  There is no Medicare Part D information in EPIC.  There is no indication in EPIC that the pt has applied for any assistance to receive his medications.

## 2015-08-22 ENCOUNTER — Other Ambulatory Visit: Payer: Self-pay | Admitting: Internal Medicine

## 2015-09-06 ENCOUNTER — Inpatient Hospital Stay (HOSPITAL_COMMUNITY)
Admission: EM | Admit: 2015-09-06 | Discharge: 2015-09-08 | DRG: 064 | Disposition: A | Discharge status: Home / Self Care | Payer: Managed Care, Other (non HMO) | Attending: Student in an Organized Health Care Education/Training Program | Admitting: Student in an Organized Health Care Education/Training Program

## 2015-09-06 ENCOUNTER — Emergency Department (HOSPITAL_COMMUNITY): Payer: Managed Care, Other (non HMO)

## 2015-09-06 ENCOUNTER — Encounter: Payer: Self-pay | Admitting: *Deleted

## 2015-09-06 ENCOUNTER — Encounter (HOSPITAL_COMMUNITY): Payer: Self-pay | Admitting: *Deleted

## 2015-09-06 DIAGNOSIS — R05 Cough: Secondary | ICD-10-CM | POA: Diagnosis not present

## 2015-09-06 DIAGNOSIS — R402142 Coma scale, eyes open, spontaneous, at arrival to emergency department: Secondary | ICD-10-CM | POA: Diagnosis present

## 2015-09-06 DIAGNOSIS — I639 Cerebral infarction, unspecified: Secondary | ICD-10-CM | POA: Diagnosis not present

## 2015-09-06 DIAGNOSIS — I10 Essential (primary) hypertension: Secondary | ICD-10-CM | POA: Diagnosis not present

## 2015-09-06 DIAGNOSIS — M6281 Muscle weakness (generalized): Secondary | ICD-10-CM | POA: Diagnosis not present

## 2015-09-06 DIAGNOSIS — Z9114 Patient's other noncompliance with medication regimen: Secondary | ICD-10-CM

## 2015-09-06 DIAGNOSIS — N179 Acute kidney failure, unspecified: Secondary | ICD-10-CM | POA: Diagnosis not present

## 2015-09-06 DIAGNOSIS — E785 Hyperlipidemia, unspecified: Secondary | ICD-10-CM | POA: Insufficient documentation

## 2015-09-06 DIAGNOSIS — B20 Human immunodeficiency virus [HIV] disease: Secondary | ICD-10-CM | POA: Diagnosis not present

## 2015-09-06 DIAGNOSIS — R059 Cough, unspecified: Secondary | ICD-10-CM

## 2015-09-06 DIAGNOSIS — G8191 Hemiplegia, unspecified affecting right dominant side: Secondary | ICD-10-CM | POA: Diagnosis present

## 2015-09-06 DIAGNOSIS — R29898 Other symptoms and signs involving the musculoskeletal system: Secondary | ICD-10-CM

## 2015-09-06 DIAGNOSIS — Z7982 Long term (current) use of aspirin: Secondary | ICD-10-CM

## 2015-09-06 DIAGNOSIS — D61818 Other pancytopenia: Secondary | ICD-10-CM | POA: Diagnosis present

## 2015-09-06 DIAGNOSIS — R402362 Coma scale, best motor response, obeys commands, at arrival to emergency department: Secondary | ICD-10-CM | POA: Diagnosis present

## 2015-09-06 DIAGNOSIS — R200 Anesthesia of skin: Secondary | ICD-10-CM | POA: Diagnosis not present

## 2015-09-06 DIAGNOSIS — I63411 Cerebral infarction due to embolism of right middle cerebral artery: Secondary | ICD-10-CM

## 2015-09-06 DIAGNOSIS — Z9119 Patient's noncompliance with other medical treatment and regimen: Secondary | ICD-10-CM

## 2015-09-06 DIAGNOSIS — R29702 NIHSS score 2: Secondary | ICD-10-CM | POA: Diagnosis present

## 2015-09-06 DIAGNOSIS — R402252 Coma scale, best verbal response, oriented, at arrival to emergency department: Secondary | ICD-10-CM | POA: Diagnosis present

## 2015-09-06 DIAGNOSIS — E860 Dehydration: Secondary | ICD-10-CM | POA: Diagnosis present

## 2015-09-06 DIAGNOSIS — R29810 Facial weakness: Secondary | ICD-10-CM | POA: Diagnosis present

## 2015-09-06 DIAGNOSIS — E042 Nontoxic multinodular goiter: Secondary | ICD-10-CM | POA: Diagnosis present

## 2015-09-06 LAB — COMPREHENSIVE METABOLIC PANEL
ALT: 23 U/L (ref 17–63)
AST: 25 U/L (ref 15–41)
Albumin: 3.2 g/dL — ABNORMAL LOW (ref 3.5–5.0)
Alkaline Phosphatase: 84 U/L (ref 38–126)
Anion gap: 7 (ref 5–15)
BILIRUBIN TOTAL: 0.5 mg/dL (ref 0.3–1.2)
BUN: 27 mg/dL — AB (ref 6–20)
CHLORIDE: 107 mmol/L (ref 101–111)
CO2: 26 mmol/L (ref 22–32)
Calcium: 9 mg/dL (ref 8.9–10.3)
Creatinine, Ser: 1.51 mg/dL — ABNORMAL HIGH (ref 0.61–1.24)
GFR, EST AFRICAN AMERICAN: 52 mL/min — AB (ref >60)
GFR, EST NON AFRICAN AMERICAN: 45 mL/min — AB (ref >60)
Glucose, Bld: 98 mg/dL (ref 65–99)
POTASSIUM: 4.5 mmol/L (ref 3.5–5.1)
Sodium: 140 mmol/L (ref 135–145)
TOTAL PROTEIN: 6.9 g/dL (ref 6.5–8.1)

## 2015-09-06 LAB — CBC
HEMATOCRIT: 33.3 % — AB (ref 39.0–52.0)
Hemoglobin: 11 g/dL — ABNORMAL LOW (ref 13.0–17.0)
MCH: 27.9 pg (ref 26.0–34.0)
MCHC: 33 g/dL (ref 30.0–36.0)
MCV: 84.5 fL (ref 78.0–100.0)
PLATELETS: 86 10*3/uL — AB (ref 150–400)
RBC: 3.94 MIL/uL — ABNORMAL LOW (ref 4.22–5.81)
RDW: 14.8 % (ref 11.5–15.5)
WBC: 2.3 10*3/uL — AB (ref 4.0–10.5)

## 2015-09-06 LAB — DIFFERENTIAL
BASOS PCT: 0 %
Basophils Absolute: 0 10*3/uL (ref 0.0–0.1)
EOS ABS: 0 10*3/uL (ref 0.0–0.7)
EOS PCT: 1 %
LYMPHS ABS: 1.1 10*3/uL (ref 0.7–4.0)
Lymphocytes Relative: 46 %
MONO ABS: 0.3 10*3/uL (ref 0.1–1.0)
Monocytes Relative: 12 %
NEUTROS ABS: 0.9 10*3/uL — AB (ref 1.7–7.7)
Neutrophils Relative %: 41 %

## 2015-09-06 LAB — I-STAT CHEM 8, ED
BUN: 29 mg/dL — AB (ref 6–20)
CALCIUM ION: 1.21 mmol/L (ref 1.13–1.30)
CREATININE: 1.6 mg/dL — AB (ref 0.61–1.24)
Chloride: 103 mmol/L (ref 101–111)
Glucose, Bld: 92 mg/dL (ref 65–99)
HEMATOCRIT: 36 % — AB (ref 39.0–52.0)
HEMOGLOBIN: 12.2 g/dL — AB (ref 13.0–17.0)
Potassium: 4.3 mmol/L (ref 3.5–5.1)
SODIUM: 142 mmol/L (ref 135–145)
TCO2: 26 mmol/L (ref 0–100)

## 2015-09-06 LAB — I-STAT TROPONIN, ED: TROPONIN I, POC: 0 ng/mL (ref 0.00–0.08)

## 2015-09-06 LAB — PROTIME-INR
INR: 1.02 (ref 0.00–1.49)
PROTHROMBIN TIME: 13.6 s (ref 11.6–15.2)

## 2015-09-06 LAB — APTT: aPTT: 29 seconds (ref 24–37)

## 2015-09-06 LAB — CBG MONITORING, ED: Glucose-Capillary: 86 mg/dL (ref 65–99)

## 2015-09-06 MED ORDER — ACETAMINOPHEN 650 MG RE SUPP: 650.0000 mg | RECTAL | Status: DC | PRN

## 2015-09-06 MED ORDER — ATORVASTATIN CALCIUM 40 MG PO TABS
40.0000 mg | ORAL_TABLET | Freq: Every day | ORAL | Status: DC
  Administered 2015-09-07 – 2015-09-08 (×2): 40 mg via ORAL
  Filled 2015-09-06: qty 4
  Filled 2015-09-06: qty 1

## 2015-09-06 MED ORDER — ASPIRIN EC 81 MG PO TBEC
81.0000 mg | DELAYED_RELEASE_TABLET | Freq: Every day | ORAL | Status: DC
  Administered 2015-09-07: 81 mg via ORAL
  Filled 2015-09-06: qty 1

## 2015-09-06 MED ORDER — SODIUM CHLORIDE 0.9 % IJ SOLN: 3.0000 mL | INTRAMUSCULAR | Status: DC | PRN

## 2015-09-06 MED ORDER — STROKE: EARLY STAGES OF RECOVERY BOOK
Freq: Once | Status: DC
  Filled 2015-09-06: qty 1

## 2015-09-06 MED ORDER — SULFAMETHOXAZOLE-TRIMETHOPRIM 800-160 MG PO TABS
1.0000 | ORAL_TABLET | Freq: Every day | ORAL | Status: DC
  Administered 2015-09-07 – 2015-09-08 (×2): 1 via ORAL
  Filled 2015-09-06 (×2): qty 1

## 2015-09-06 MED ORDER — ACETAMINOPHEN 325 MG PO TABS: 650.0000 mg | ORAL_TABLET | ORAL | Status: DC | PRN

## 2015-09-06 NOTE — ED Notes (Addendum)
Pt reports onset this am 0600 of right arm weakness/numbness. Has difficulty gripping with right hand, +arm drift. No facial droop noted, speech is clear. Reports hx of CVA approx 6 months ago and has right side weakness as deficit, but increase in symptoms today.

## 2015-09-06 NOTE — ED Provider Notes (Signed)
CSN: [id number]     Arrival date & time 09/06/15  1200 History   First MD Initiated Contact with Patient 09/06/15 1529     Chief Complaint  Patient presents with  . Stroke Symptoms     (Consider location/radiation/quality/duration/timing/severity/associated sxs/prior Treatment) Patient is a 70 y.o. male presenting with neurologic complaint.  Neurologic Problem This is a new problem. The current episode started today. The problem occurs constantly. The problem has been gradually improving. Associated symptoms include weakness. Pertinent negatives include no abdominal pain, anorexia, arthralgias, change in bowel habit, chest pain, chills, congestion, coughing, diaphoresis, fatigue, fever, headaches, joint swelling, myalgias, nausea, neck pain, numbness, rash, sore throat, swollen glands, urinary symptoms, vertigo, visual change or vomiting. Nothing aggravates the symptoms. He has tried nothing for the symptoms. The treatment provided no relief.    Past Medical History  Diagnosis Date  . HTN (hypertension)   . Enlarged thyroid 04/22/2015    Seen on CTA 04/21/15: Contains multiple nodules measuring up to 2.1 cm in size, TSH wnl.  Needs thyroid ultrasound and biopsy  . 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  Recommend 1 year follow up with CT or MRI.   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 Immune deficiency disorder (HCC)   . HIV (human immunodeficiency virus infection) (HCC)   . Heart murmur   . Stroke [facility])    History reviewed. No pertinent past surgical history. Family History  Problem Relation Age of Onset  . Hypertension Mother   . Hyperlipidemia Mother    Social History  Substance Use Topics  . Smoking status: Never Smoker   . Smokeless tobacco: None  . Alcohol Use: No    Review of Systems  Constitutional: Negative for fever, chills, diaphoresis and fatigue.  HENT: Negative for congestion and sore throat.    Eyes: Negative for pain.  Respiratory: Negative for cough.   Cardiovascular: Negative for chest pain.  Gastrointestinal: Negative for nausea, vomiting, abdominal pain, anorexia and change in bowel habit.  Genitourinary: Negative for dysuria.  Musculoskeletal: Negative for myalgias, joint swelling, arthralgias and neck pain.  Skin: Negative for rash.  Neurological: Positive for weakness. Negative for dizziness, vertigo, tremors, seizures, syncope, speech difficulty, numbness and headaches.  Psychiatric/Behavioral: Negative for confusion.      Allergies  Review of patient's allergies indicates no known allergies.  Home Medications   Prior to Admission medications   Medication Sig Start Date End Date Taking? Authorizing Provider  aspirin EC 81 MG EC tablet Take 1 tablet (81 mg total) by mouth daily. 04/21/15  Yes Darreld Mclean, MD  atorvastatin (LIPITOR) 40 MG tablet TAKE ONE TABLET BY MOUTH ONCE DAILY AT  6PM Patient taking differently: TAKE ONE TABLET BY MOUTH ONCE DAILY 08/23/15  Yes Darreld Mclean, MD  dolutegravir (TIVICAY) 50 MG tablet Take 1 tablet (50 mg total) by mouth daily. 06/15/15  Yes Gardiner Barefoot, MD  emtricitabine-tenofovir AF (DESCOVY) 200-25 MG tablet Take 1 tablet by mouth daily. 06/15/15  Yes Gardiner Barefoot, MD  lisinopril-hydrochlorothiazide (PRINZIDE,ZESTORETIC) 20-12.5 MG tablet Take 2 tablets by mouth daily. 06/01/15  Yes Darreld Mclean, MD  ondansetron (ZOFRAN) 4 MG tablet Take 1 tablet (4 mg total) by mouth every 8 (eight) hours as needed for nausea or vomiting. 06/22/15  Yes Courteney Lyn Mackuen, MD  sulfamethoxazole-trimethoprim (BACTRIM DS,SEPTRA DS) 800-160 MG tablet Take 1 tablet by mouth daily. 06/02/15  Yes Darreld Mclean, MD  BP 159/98 mmHg  Pulse 65  Temp(Src) 98.1 F (36.7 C) (Oral)  Resp 16  Ht 5\' 9"  (1.753 m)  Wt 67.495 kg  BMI 21.96 kg/m2  SpO2 100% Physical Exam  Constitutional: He appears well-developed and well-nourished. No distress.  HENT:   Head: Normocephalic and atraumatic.  Eyes: Conjunctivae and EOM are normal. Pupils are equal, round, and reactive to light.  Neck: Normal range of motion.  Cardiovascular: Normal rate and regular rhythm.  Exam reveals no gallop and no friction rub.   No murmur heard. Pulmonary/Chest: Effort normal and breath sounds normal. No respiratory distress. He has no wheezes. He has no rales. He exhibits no tenderness.  Abdominal: Soft. Bowel sounds are normal. He exhibits no distension and no mass. There is no tenderness. There is no rebound and no guarding.  Neurological: He is alert. He has normal strength. A sensory deficit is present. No cranial nerve deficit. Coordination normal. GCS eye subscore is 4. GCS verbal subscore is 5. GCS motor subscore is 6.  Pt with equal strength in bilateral upper extremities at this time. Reports mild right sided facial numbness.  Normal finger to nose and heel to shin.  Skin: Skin is warm and dry. No rash noted. He is not diaphoretic. No erythema.  Psychiatric: He has a normal mood and affect. His behavior is normal.    ED Course  Procedures (including critical care time) Labs Review Labs Reviewed  CBC - Abnormal; Notable for the following:    WBC 2.3 (*)    RBC 3.94 (*)    Hemoglobin 11.0 (*)    HCT 33.3 (*)    Platelets 86 (*)    All other components within normal limits  DIFFERENTIAL - Abnormal; Notable for the following:    Neutro Abs 0.9 (*)    All other components within normal limits  COMPREHENSIVE METABOLIC PANEL - Abnormal; Notable for the following:    BUN 27 (*)    Creatinine, Ser 1.51 (*)    Albumin 3.2 (*)    GFR calc non Af Amer 45 (*)    GFR calc Af Amer 52 (*)    All other components within normal limits  I-STAT CHEM 8, ED - Abnormal; Notable for the following:    BUN 29 (*)    Creatinine, Ser 1.60 (*)    Hemoglobin 12.2 (*)    HCT 36.0 (*)    All other components within normal limits  PROTIME-INR  APTT  BASIC METABOLIC PANEL   CBC  HIV 1 RNA QUANT-NO REFLEX-BLD  T-HELPER CELLS (CD4) COUNT (NOT AT [facility])  HEMOGLOBIN A1C  LIPID PANEL  I-STAT TROPOININ, ED  CBG MONITORING, ED    Imaging Review Ct Head Wo Contrast  09/06/2015  CLINICAL DATA:  Acute right handed numbness. EXAM: CT HEAD WITHOUT CONTRAST TECHNIQUE: Contiguous axial images were obtained from the base of the skull through the vertex without intravenous contrast. COMPARISON:  CT scan of April 20, 2015. FINDINGS: Bony calvarium appears intact. Mild chronic ischemic white matter disease is noted. Old left periventricular white matter infarction is noted. Stable old infarction adjacent to right frontal horn. No mass effect or midline shift is noted. Ventricular size is within normal limits. There is no evidence of mass lesion, hemorrhage or acute infarction. IMPRESSION: Mild chronic ischemic white matter disease. Old left periventricular white matter infarction and old infarction adjacent to right frontal horn infarction. No acute intracranial abnormality seen. Electronically Signed   By: Lupita Raider, M.D.  On: 09/06/2015 13:59   Mr Brain Wo Contrast  09/06/2015  CLINICAL DATA:  Right arm weakness and numbness beginning at 6 o\'clock a.m. today. CVA 6 months ago. Persistent right-sided weakness. EXAM: MRI HEAD WITHOUT CONTRAST TECHNIQUE: Multiplanar, multiecho pulse sequences of the brain and surrounding structures were obtained without intravenous contrast. COMPARISON:  CT head without contrast from the same day. MRI brain 04/20/2015. FINDINGS: A punctate nonhemorrhagic periventricular acute infarct is present along the posterior right temporal and occipital lobe. No other acute infarct is present. The previously noted infarct of the left lentiform nucleus and corona radiata is again noted. There is a remote infarct of the anterior right coronal radiata. Moderate periventricular and subcortical white matter disease is otherwise stable. The ventricles are of normal  size. White matter changes extend into the brainstem. 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 The internal auditory canals are within normal limits. Flow is present in the major intracranial arteries. Globes and orbits are intact. Mild mucosal thickening is present in the ethmoid air cells. A prominent polyp or mucous retention cyst is present in the inferior right maxillary sinus. There is mild mucosal thickening along the inferior left maxillary sinus. The remaining paranasal sinuses and the mastoid air cells are clear. The skullbase is unremarkable. Multiple punctate foci of susceptibility are again noted within the thalami bilaterally as well as the posterior left lentiform nucleus. There are at least 2 punctate foci of remote hemorrhage in the left parietal occipital lobe and a single lesion in the left temporal lobe. A left paramedian pontine remote hemorrhage is noted. IMPRESSION: 1. Punctate nonhemorrhagic infarct along the posterior right temporal and occipital lobe adjacent to the ventricle. 2. Expected evolution of remote infarct involving the left lentiform nucleus and corona radiata. 3. Multiple foci of remote hemorrhage involving the thalami bilaterally as well as the left temporal and occipital lobe. Remote hemorrhage in the left paramedian pons. 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 Amyloid angiopathy is considered. 4. Mild stable sinus disease. Electronically Signed   By: Marin Robertshristopher  Mattern M.D.   On: 09/06/2015 19:24   I have personally reviewed and evaluated these images and lab results as part of my medical decision-making.   EKG Interpretation   Date/Time:  Wednesday September 06 2015 12:24:02 EST Ventricular Rate:  73 PR Interval:  154 QRS Duration: 84 QT Interval:  384 QTC Calculation: 423 R Axis:   -24 Text Interpretation:  Normal sinus rhythm Possible Anterior infarct ,  age  undetermined T wave abnormality, consider lateral ischemia Abnormal ECG t  waves flipped in V6 on prior No significant change since last tracing  Confirmed by Kandis MannanMACKUEN, COURTNEY ([id number]) on 09/06/2015 12:52:52 PM      MDM   Final diagnoses:  Cerebral infarction due to unspecified mechanism  RUE weakness  CVA (cerebral vascular accident) [facility](HCC)    70 year old African-American male with past medical history of CVA presents in the setting of right upper extremity weakness. Patient reports proximal he 6 hours prior to coming to emergency department he had right upper extremity weakness including inability to grip steering whell his hand was so weak. He reported he had some mild numbness in his arm at that time. He additionally reports some mild right facial numbness. Patient reports the symptoms have been improving since that time and on arrival reports he feels mildly weaker in right upper extremity. On physical examination unable to tell  demonstrate right and left upper extremity. Patient does have some mild right-sided facial numbness which is subjective on examination. No other acute focal neurologic findings appreciated. He does report some mild right-sided weakness from previous CVA which has been improving.  No significant abnormalities noted on laboratory analysis other than slight worsening of creatinine. Glucose within normal limits. MRI obtained which reveals likely new CVA. In setting this findings neurology was consult. I discussed case with Dr. Shaune Pascal. At this time he believes patient is appropriate for medicine admission.  Discussed case with Dr. Kyung Rudd. Patient admitted to medicine in stable at time of admission.  Attending has seen and evaluated patient and Dr. Clarene Duke is in agreement with plan.    Stacy Gardner, MD 09/07/15 0022  Laurence Spates, MD 09/07/15 [phone]

## 2015-09-06 NOTE — ED Notes (Addendum)
Pt in MRI.

## 2015-09-06 NOTE — Consult Note (Addendum)
Referring Physician: ED    Chief Complaint: right arm weakness, tingling (resolved), stroke on MRI  HPI:                                                                                                                                         Thomas [name] is an 70 y.o. 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 Patient said that he woke up today feeling well, but around 7 am started having a " weaker sensation and slight tingling of my right arm" which lasted for the most part of the morning and gradually subside. He said that in his way to work he couldn't hold the steering wheel well because the right hand was weak and was having a great deal of difficulty utilizing his right hand at work. Stated that he spoke to a fireman that volunteers at work and was advised to come to the ED for evaluation. Thomas [name] said that at this moment his right arm and hand " feels strong like it was before". Denies associated HA, vertigo, double vision, difficulty swallowing, slurred speech, imbalance, language or vision impairment. MRI brain was personally reviewed and showed a punctate acute area of ischemia involving the right posterior temporal-occipital region. On aspirin daily.    Date last known well: 09/06/15 Time last known well: 7 am tPA Given: symptoms resolved   Past Medical History  Diagnosis Date  . HTN (hypertension)   . Enlarged thyroid 04/22/2015    Seen on CTA 04/21/15: Contains multiple nodules measuring up to 2.1 cm in size, TSH wnl.  Needs thyroid ultrasound and biopsy  . 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  Recommend 1 year follow up with CT or MRI.   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 Immune deficiency  disorder ([place])   . HIV (human immunodeficiency virus infection) ([place])   . Heart murmur   . Stroke [facility])     History reviewed. No pertinent past surgical history.  Family History  Problem Relation Age of Onset  . Hypertension Mother   . Hyperlipidemia Mother    Social History:  reports that he has never smoked. He does not have any smokeless tobacco history on file. He reports that he does not drink alcohol or use illicit drugs. Family history: no MS, brain tumor, or brain aneurysm Allergies: No Known Allergies  Medications:  I have reviewed the patient's current medications.  ROS:                                                                                                                                       History obtained from chart review and the patient  General ROS: negative for - chills, fatigue, fever, night sweats, weight gain or weight loss Psychological ROS: negative for - behavioral disorder, hallucinations, memory difficulties, mood swings or suicidal ideation Ophthalmic ROS: negative for - blurry vision, double vision, eye pain or loss of vision ENT ROS: negative for - epistaxis, nasal discharge, oral lesions, sore throat, tinnitus or vertigo Allergy and Immunology ROS: negative for - hives or itchy/watery eyes Hematological and Lymphatic ROS: negative for - bleeding problems, bruising or swollen lymph nodes Endocrine ROS: negative for - galactorrhea, hair pattern changes, polydipsia/polyuria or temperature intolerance Respiratory ROS: negative for - cough, hemoptysis, shortness of breath or wheezing Cardiovascular ROS: negative for - chest pain, dyspnea on exertion, edema or irregular heartbeat Gastrointestinal ROS: negative for - abdominal pain, diarrhea, hematemesis, nausea/vomiting or stool incontinence Genito-Urinary ROS: negative for -  dysuria, hematuria, incontinence or urinary frequency/urgency Musculoskeletal ROS: negative for - joint swelling Neurological ROS: as noted in HPI Dermatological ROS: negative for rash and skin lesion changes  Physical exam:  Constitutional: well developed, pleasant male in no apparent distress. Blood pressure 156/87, pulse 61, temperature 98.1 F (36.7 C), temperature source Oral, resp. rate 18, height _0  (1.753 m), weight 67.495 kg (148 lb 12.8 oz), SpO2 98 %. Eyes: no jaundice or exophthalmos.  Head: normocephalic. Neck: supple, no bruits, no JVD. Cardiac: no murmurs. Lungs: clear. Abdomen: soft, no tender, no mass. Extremities: no edema, clubbing, or cyanosis.  Skin: no rash  Neurologic Examination:                                                                                                      General: NAD Mental Status: Alert, oriented, thought content appropriate.  Speech fluent without evidence of aphasia.  Able to follow 3 step commands without difficulty. Cranial Nerves: II: Discs flat bilaterally; Visual fields grossly normal, pupils equal, round, reactive to light and accommodation III,IV, VI: ptosis not present, extra-ocular motions intact bilaterally V,VII: smile symmetric, facial light touch sensation normal bilaterally VIII: hearing normal bilaterally IX,X: uvula rises symmetrically XI: bilateral shoulder shrug XII: midline tongue extension without atrophy or fasciculations Motor: Right : Upper extremity   5/5    Left:  Upper extremity   5/5  Lower extremity   5/5     Lower extremity   5/5 Tone and bulk:normal tone throughout; no atrophy noted Sensory: Pinprick and light touch intact throughout, bilaterally Deep Tendon Reflexes:  Right: Upper Extremity   Left: Upper extremity   biceps (C-5 to C-6) 2/4   biceps (C-5 to C-6) 2/4 tricep (C7) 2/4    triceps (C7) 2/4 Brachioradialis (C6) 2/4  Brachioradialis (C6) 2/4  Lower Extremity Lower Extremity   quadriceps (L-2 to L-4) 2/4   quadriceps (L-2 to L-4) 2/4 Achilles (S1) 2/4   Achilles (S1) 2/4  Plantars: Right: downgoing   Left: downgoing Cerebellar: normal finger-to-nose,  normal heel-to-shin test Gait:  Unable to test    Results for orders placed or performed during the hospital encounter of 09/06/15 (from the past 48 hour(s))  Protime-INR     Status: None   Collection Time: 09/06/15 12:25 PM  Result Value Ref Range   Prothrombin Time 13.6 11.6 - 15.2 seconds   INR 1.02 0.00 - 1.49  APTT     Status: None   Collection Time: 09/06/15 12:25 PM  Result Value Ref Range   aPTT 29 24 - 37 seconds  CBC     Status: Abnormal   Collection Time: 09/06/15 12:25 PM  Result Value Ref Range   WBC 2.3 (L) 4.0 - 10.5 K/uL   RBC 3.94 (L) 4.22 - 5.81 MIL/uL   Hemoglobin 11.0 (L) 13.0 - 17.0 g/dL   HCT 33.3 (L) 39.0 - 52.0 %   MCV 84.5 78.0 - 100.0 fL   MCH 27.9 26.0 - 34.0 pg   MCHC 33.0 30.0 - 36.0 g/dL   RDW 14.8 11.5 - 15.5 %   Platelets 86 (L) 150 - 400 K/uL    Comment: PLATELET COUNT CONFIRMED BY SMEAR REPEATED TO VERIFY   Differential     Status: Abnormal   Collection Time: 09/06/15 12:25 PM  Result Value Ref Range   Neutrophils Relative % 41 %   Lymphocytes Relative 46 %   Monocytes Relative 12 %   Eosinophils Relative 1 %   Basophils Relative 0 %   Neutro Abs 0.9 (L) 1.7 - 7.7 K/uL   Lymphs Abs 1.1 0.7 - 4.0 K/uL   Monocytes Absolute 0.3 0.1 - 1.0 K/uL   Eosinophils Absolute 0.0 0.0 - 0.7 K/uL   Basophils Absolute 0.0 0.0 - 0.1 K/uL   RBC Morphology CRENATED RBCs     Comment: ACANTHOCYTES  Comprehensive metabolic panel     Status: Abnormal   Collection Time: 09/06/15 12:25 PM  Result Value Ref Range   Sodium 140 135 - 145 mmol/L   Potassium 4.5 3.5 - 5.1 mmol/L   Chloride 107 101 - 111 mmol/L   CO2 26 22 - 32 mmol/L   Glucose, Bld 98 65 - 99 mg/dL   BUN 27 (H) 6 - 20 mg/dL   Creatinine, Ser 1.51 (H) 0.61 - 1.24 mg/dL   Calcium 9.0 8.9 - 10.3 mg/dL   Total  Protein 6.9 6.5 - 8.1 g/dL   Albumin 3.2 (L) 3.5 - 5.0 g/dL   AST 25 15 - 41 U/L   ALT 23 17 - 63 U/L   Alkaline Phosphatase 84 38 - 126 U/L   Total Bilirubin 0.5 0.3 - 1.2 mg/dL   GFR calc non Af Amer 45 (L) >60 mL/min   GFR calc Af Amer 52 (L) >60 mL/min    Comment: (NOTE) The eGFR has  been calculated using the CKD EPI equation. This calculation has not been validated in all clinical situations. eGFR's persistently <60 mL/min signify possible Chronic Kidney Disease.    Anion gap 7 5 - 15  I-stat troponin, ED (not at [facility], [facility])     Status: None   Collection Time: 09/06/15 12:31 PM  Result Value Ref Range   Troponin i, poc 0.00 0.00 - 0.08 ng/mL   Comment 3            Comment: Due to the release kinetics of cTnI, a negative result within the first hours of the onset of symptoms does not rule out myocardial infarction with certainty. If myocardial infarction is still suspected, repeat the test at appropriate intervals.   I-Stat Chem 8, ED  (not at [facility], [facility])     Status: Abnormal   Collection Time: 09/06/15 12:33 PM  Result Value Ref Range   Sodium 142 135 - 145 mmol/L   Potassium 4.3 3.5 - 5.1 mmol/L   Chloride 103 101 - 111 mmol/L   BUN 29 (H) 6 - 20 mg/dL   Creatinine, Ser 1.60 (H) 0.61 - 1.24 mg/dL   Glucose, Bld 92 65 - 99 mg/dL   Calcium, Ion 1.21 1.13 - 1.30 mmol/L   TCO2 26 0 - 100 mmol/L   Hemoglobin 12.2 (L) 13.0 - 17.0 g/dL   HCT 36.0 (L) 39.0 - 52.0 %  CBG monitoring, ED     Status: None   Collection Time: 09/06/15  5:42 PM  Result Value Ref Range   Glucose-Capillary 86 65 - 99 mg/dL   Ct Head Wo Contrast  09/06/2015  CLINICAL DATA:  Acute right handed numbness. EXAM: CT HEAD WITHOUT CONTRAST TECHNIQUE: Contiguous axial images were obtained from the base of the skull through the vertex without intravenous contrast. COMPARISON:  CT scan of April 20, 2015. FINDINGS: Bony calvarium appears intact. Mild chronic ischemic white matter disease is noted. Old left  periventricular white matter infarction is noted. Stable old infarction adjacent to right frontal horn. No mass effect or midline shift is noted. Ventricular size is within normal limits. There is no evidence of mass lesion, hemorrhage or acute infarction. IMPRESSION: Mild chronic ischemic white matter disease. Old left periventricular white matter infarction and old infarction adjacent to right frontal horn infarction. No acute intracranial abnormality seen. Electronically Signed   By: Marijo Conception, M.D.   On: 09/06/2015 13:59   Thomas Brain Wo Contrast  09/06/2015  CLINICAL DATA:  Right arm weakness and numbness beginning at 6 o\'clock a.m. today. CVA 6 months ago. Persistent right-sided weakness. EXAM: MRI HEAD WITHOUT CONTRAST TECHNIQUE: Multiplanar, multiecho pulse sequences of the brain and surrounding structures were obtained without intravenous contrast. COMPARISON:  CT head without contrast from the same day. MRI brain 04/20/2015. FINDINGS: A punctate nonhemorrhagic periventricular acute infarct is present along the posterior right temporal and occipital lobe. No other acute infarct is present. The previously noted infarct of the left lentiform nucleus and corona radiata is again noted. There is a remote infarct of the anterior right coronal radiata. Moderate periventricular and subcortical white matter disease is otherwise stable. The ventricles are of normal size. White matter changes extend into the brainstem. 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 The internal auditory canals are within normal limits. Flow is present in the major intracranial arteries. Globes and orbits are intact. Mild mucosal thickening is present in the  ethmoid air cells. A prominent polyp or mucous retention cyst is present in the inferior right maxillary sinus. There is mild mucosal thickening along the inferior left maxillary sinus. The  remaining paranasal sinuses and the mastoid air cells are clear. The skullbase is unremarkable. Multiple punctate foci of susceptibility are again noted within the thalami bilaterally as well as the posterior left lentiform nucleus. There are at least 2 punctate foci of remote hemorrhage in the left parietal occipital lobe and a single lesion in the left temporal lobe. A left paramedian pontine remote hemorrhage is noted. IMPRESSION: 1. Punctate nonhemorrhagic infarct along the posterior right temporal and occipital lobe adjacent to the ventricle. 2. Expected evolution of remote infarct involving the left lentiform nucleus and corona radiata. 3. Multiple foci of remote hemorrhage involving the thalami bilaterally as well as the left temporal and occipital lobe. Remote hemorrhage in the left paramedian pons. 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 Amyloid angiopathy is considered. 4. Mild stable sinus disease. Electronically Signed   By: San Morelle M.D.   On: 09/06/2015 19:24     Assessment: 70 y.o. male with prior left MCA lenticulostriate territory infarction with residual subtle right arm weakness who earlier today sustained a transient episode of worsening right arm/hand lasting for a few hours. Now with an unimpressive neuro-exam but MRI revealing a punctuate non hemorrhagic infarct right posterior temporo-occipital region which anatomically does not explain his ipsilateral transient symptoms. He is to be admitted to the hospital for observation tonight and discussion about possible modification of secondary stroke prevention therapy.  Patient had extensive stroke work up 8/16 but wonder if current infarct is embolic and patient may need further investigations. Stroke team will resume care in the morning.    Stroke Risk Factors -age, HTN, stroke   Dorian Pod, MD [facility] [phone]  09/06/2015, 10:24 PM

## 2015-09-06 NOTE — ED Notes (Signed)
CBG 86. 

## 2015-09-06 NOTE — ED Notes (Signed)
Admitting at bedside 

## 2015-09-06 NOTE — ED Notes (Signed)
Requested that EDP come give pt an update

## 2015-09-06 NOTE — ED Notes (Signed)
Neuro at bedside.

## 2015-09-06 NOTE — ED Notes (Signed)
MD at the bedside  

## 2015-09-06 NOTE — H&P (Signed)
Date: 09/06/2015               Patient Name:  Thomas [name] MRN: [id number]  DOB: [birth year] Age / Sex: 70 y.o., male   PCP: Darreld Mclean, MD         Medical Service: Internal Medicine Teaching Service         Attending Physician: Dr. Tyson Alias, MD    First Contact: Dr. Gwynn Burly Pager: [phone]  Second Contact: Dr. Rich Number Pager: [phone]       After Hours (After 5p/  First Contact Pager: [phone]  weekends / holidays): Second Contact Pager: [phone]   Chief Complaint: R arm weakness  History of Present Illness: Mr. Harr is a 70yo M with PMHx of HTN, h/o L lenticulostriate infarct in 04/2015, aortic ectasia and HIV diagnosed 04/2015 during CVA workup (last CD4 190, VL 15K in 9/16, only on TMP-SMX) who presents with R arm numbness and weakness distal from the elbow that started suddenly this morning at 7am while he was driving to work. He went to work and was then told to come to the ED. His weakness and numbness resolved roughly 3 hours ago, around 7-8pm. He did also have some R sided facial numbness on a neuro exam in the ED that he didn't previously notice that has since resolved. He denies any fevers, headache, vision changes, syncope, chest pain, palpitations, shortness of breath, nausea, vomiting, abdominal pain, urinary or bowel symptoms, any other weakness, slurred speech, gait disturbance, or other issues throughout. He is compliant with all of his medications. He says has not noticed residual deficits from his previous stroke in August, but prior notes suggest subtle residual R arm weakness. At bedside, he is asymptomatic currently.  Meds: Current Facility-Administered Medications  Medication Dose Route Frequency Provider Last Rate Last Dose  .  stroke: mapping our early stages of recovery book   Does not apply Once Yolanda Manges, DO      . acetaminophen (TYLENOL) tablet 650 mg  650 mg Oral Q4H PRN Yolanda Manges, DO       Or  . acetaminophen (TYLENOL)  suppository 650 mg  650 mg Rectal Q4H PRN Yolanda Manges, DO      . [START ON 09/07/2015] aspirin EC tablet 81 mg  81 mg Oral Daily Lunette Stands, MD      . Melene Muller ON 09/07/2015] atorvastatin (LIPITOR) tablet 40 mg  40 mg Oral Daily Yolanda Manges, DO      . sodium chloride 0.9 % injection 3 mL  3 mL Intravenous PRN Yolanda Manges, DO      . Melene Muller ON 09/07/2015] sulfamethoxazole-trimethoprim (BACTRIM DS,SEPTRA DS) 800-160 MG per tablet 1 tablet  1 tablet Oral Daily Yolanda Manges, DO       Current Outpatient Prescriptions  Medication Sig Dispense Refill  . aspirin EC 81 MG EC tablet Take 1 tablet (81 mg total) by mouth daily. 30 tablet 3  . atorvastatin (LIPITOR) 40 MG tablet TAKE ONE TABLET BY MOUTH ONCE DAILY AT  6PM (Patient taking differently: TAKE ONE TABLET BY MOUTH ONCE DAILY) 30 tablet 11  . dolutegravir (TIVICAY) 50 MG tablet Take 1 tablet (50 mg total) by mouth daily. 30 tablet 5  . emtricitabine-tenofovir AF (DESCOVY) 200-25 MG tablet Take 1 tablet by mouth daily. 30 tablet 5  . lisinopril-hydrochlorothiazide (PRINZIDE,ZESTORETIC) 20-12.5 MG tablet Take 2 tablets by mouth daily. 60 tablet 2  . ondansetron (ZOFRAN) 4 MG tablet  Take 1 tablet (4 mg total) by mouth every 8 (eight) hours as needed for nausea or vomiting. 11 tablet 0  . sulfamethoxazole-trimethoprim (BACTRIM DS,SEPTRA DS) 800-160 MG tablet Take 1 tablet by mouth daily. 30 tablet 2    Allergies: Allergies as of 09/06/2015  . (No Known Allergies)   Past Medical History  Diagnosis Date  . HTN (hypertension)   . Enlarged thyroid 04/22/2015    Seen on CTA 04/21/15: Contains multiple nodules measuring up to 2.1 cm in size, TSH wnl.  Needs thyroid ultrasound and biopsy  . 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  Recommend 1 year follow up with CT or MRI.   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 Immune deficiency disorder (HCC)   . HIV (human immunodeficiency virus  infection) (HCC)   . Heart murmur   . Stroke [facility])    History reviewed. No pertinent past surgical history. Family History  Problem Relation Age of Onset  . Hypertension Mother   . Hyperlipidemia Mother    Social History   Social History  . Marital Status: Single    Spouse Name: N/A  . Number of Children: N/A  . Years of Education: N/A   Occupational History  . Not on file.   Social History Main Topics  . Smoking status: Never Smoker   . Smokeless tobacco: Not on file  . Alcohol Use: No  . Drug Use: No  . Sexual Activity: Not on file   Other Topics Concern  . Not on file   Social History Narrative   Review of Systems: Pertinent items noted in HPI and remainder of comprehensive ROS otherwise negative.  Physical Exam: Blood pressure 156/87, pulse 61, temperature 98.1 F (36.7 C), temperature source Oral, resp. rate 18, height 5\' 9"  (1.753 m), weight 148 lb 12.8 oz (67.495 kg), SpO2 98 %.   Gen: Well-appearing, alert and oriented to person, place, and time HEENT: Oropharynx clear without erythema or exudate.  Neck: No cervical LAD, no thyromegaly or nodules, no JVD noted. CV: Normal rate, regular rhythm, no murmurs, rubs, or gallops Pulmonary: Normal effort, CTA bilaterally, no wheezing, rales, or rhonchi Abdominal: Soft, non-tender, non-distended, without rebound, guarding, or masses Extremities: Distal pulses 2+ in upper and lower extremities bilaterally, no tenderness, erythema or edema Skin: No atypical appearing moles. No rashes Neuro: Subtle R lower facial droop with nasolabial flattening. All other CNII-XII except for CVII are grossly intact. No focal strength or sensory deficits. No dysdiadochokinesia or other noted coordination deficits.  Lab results: Basic Metabolic Panel:  Recent Labs  16/10/96 1225 09/06/15 1233  NA 140 142  K 4.5 4.3  CL 107 103  CO2 26  --   GLUCOSE 98 92  BUN 27* 29*  CREATININE 1.51* 1.60*  CALCIUM 9.0  --    Liver  Function Tests:  Recent Labs  09/06/15 1225  AST 25  ALT 23  ALKPHOS 84  BILITOT 0.5  PROT 6.9  ALBUMIN 3.2*   CBC:  Recent Labs  09/06/15 1225 09/06/15 1233  WBC 2.3*  --   NEUTROABS 0.9*  --   HGB 11.0* 12.2*  HCT 33.3* 36.0*  MCV 84.5  --   PLT 86*  --    Coagulation:  Recent Labs  09/06/15 1225  LABPROT 13.6  INR 1.02   Imaging results:  Ct Head Wo Contrast  09/06/2015  CLINICAL DATA:  Acute right  handed numbness. EXAM: CT HEAD WITHOUT CONTRAST TECHNIQUE: Contiguous axial images were obtained from the base of the skull through the vertex without intravenous contrast. COMPARISON:  CT scan of April 20, 2015. FINDINGS: Bony calvarium appears intact. Mild chronic ischemic white matter disease is noted. Old left periventricular white matter infarction is noted. Stable old infarction adjacent to right frontal horn. No mass effect or midline shift is noted. Ventricular size is within normal limits. There is no evidence of mass lesion, hemorrhage or acute infarction. IMPRESSION: Mild chronic ischemic white matter disease. Old left periventricular white matter infarction and old infarction adjacent to right frontal horn infarction. No acute intracranial abnormality seen. Electronically Signed   By: Lupita Raider, M.D.   On: 09/06/2015 13:59   Mr Brain Wo Contrast  09/06/2015  CLINICAL DATA:  Right arm weakness and numbness beginning at 6 o\'clock a.m. today. CVA 6 months ago. Persistent right-sided weakness. EXAM: MRI HEAD WITHOUT CONTRAST TECHNIQUE: Multiplanar, multiecho pulse sequences of the brain and surrounding structures were obtained without intravenous contrast. COMPARISON:  CT head without contrast from the same day. MRI brain 04/20/2015. FINDINGS: A punctate nonhemorrhagic periventricular acute infarct is present along the posterior right temporal and occipital lobe. No other acute infarct is present. The previously noted infarct of the left lentiform nucleus and corona  radiata is again noted. There is a remote infarct of the anterior right coronal radiata. Moderate periventricular and subcortical white matter disease is otherwise stable. The ventricles are of normal size. White matter changes extend into the brainstem. 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 The internal auditory canals are within normal limits. Flow is present in the major intracranial arteries. Globes and orbits are intact. Mild mucosal thickening is present in the ethmoid air cells. A prominent polyp or mucous retention cyst is present in the inferior right maxillary sinus. There is mild mucosal thickening along the inferior left maxillary sinus. The remaining paranasal sinuses and the mastoid air cells are clear. The skullbase is unremarkable. Multiple punctate foci of susceptibility are again noted within the thalami bilaterally as well as the posterior left lentiform nucleus. There are at least 2 punctate foci of remote hemorrhage in the left parietal occipital lobe and a single lesion in the left temporal lobe. A left paramedian pontine remote hemorrhage is noted. IMPRESSION: 1. Punctate nonhemorrhagic infarct along the posterior right temporal and occipital lobe adjacent to the ventricle. 2. Expected evolution of remote infarct involving the left lentiform nucleus and corona radiata. 3. Multiple foci of remote hemorrhage involving the thalami bilaterally as well as the left temporal and occipital lobe. Remote hemorrhage in the left paramedian pons. 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 Amyloid angiopathy is considered. 4. Mild stable sinus disease. Electronically Signed   By: Marin Roberts M.D.   On: 09/06/2015 19:24    Other results: EKG: normal sinus rhythm, nonspecific ST and T waves changes.  Assessment & Plan by Problem: Active Problems:   CVA (cerebral infarction)   Essential  hypertension   HIV disease (HCC)   AKI (acute kidney injury) (HCC)   Pancytopenia (HCC) 1. CVA - h/o L lenticulostriate infarct, HTN who presents with R-sided arm weakness. CT negative but MRI shows expected evolution of remote old infarct, multiple foci of remote hemorrhage bilaterally, as well as a new posterior lacunar infarct along the R temporal and occipital lobe adjacent to the ventricle. I am  most concerned for an embolic source given that he has multiple old hemorrhagic foci, as well as a new infarct that does not correlate with his transient neurologic symptoms. TTE was done 04/2015 showing Normal LV systolic function; grade 1 diastolic dysfunction;moderate biatrial enlargement; calcified aortic valve with mildeccentric AI; trace MR and TR. TEE was not done. No history of documented afib.  -Neurology following -Should get TEE for further e/f possible embolic source -Cont ASA, statin -Permissive HTN - hold home prinzide  2. HIV - First presented in 2/16 with pancytopenia and epistaxis but HIV testing was not done at that time. Dx 04/2015 incidentally on CVA w/u. Saw Dr. Luciana Axeomer who rec'd starting Tivicay/Descovy and TMP/SMX. Last CD4 190, VL 15K in 9/16.  Patient is still currently pancytopenic, most likely 2/2 currently untreated HIV infection. -Need to f/u regarding initiating HAART. Not a compliance issue, pt has insurance, but there have been issues with him obtaining medication thus far. Consider consulting CSW for assistance if needed -Cont home TMP-SMX  3. AKI - previous Cr baseline of 0.9 in early 2016, has been elevated to 2.1 in October, and not lower than 1.2 since then. K has been normal. Unclear etiology at this point, possibly due to starting TMP. Pt euvolemic on exam but interval bump from 1.2 to 1.6 may be 2/2 dehydration although denies decreased PO intake, N/V/D. -Trend BMP  4. HTN -Permissive HTN - hold home prinzide  Dispo: Disposition is deferred at this time, awaiting  improvement of current medical problems. Anticipated discharge in approximately 1-3 day(s).   The patient does have a current PCP (Darreld McleanVishal Patel, MD) and [facility] after [facility].  The patient does not have transportation limitations that hinder transportation to clinic appointments.  Signed: Darrick HuntsmanWilliam R Gladys Deckard, MD 09/06/2015, 11:24 PM

## 2015-09-06 NOTE — ED Notes (Signed)
Pt back from MRI 

## 2015-09-06 NOTE — ED Notes (Signed)
EDP at bedside  

## 2015-09-06 NOTE — ED Notes (Signed)
Attempted IV x2. Unable to access. [place], RN at the bedside

## 2015-09-07 ENCOUNTER — Observation Stay (HOSPITAL_COMMUNITY): Payer: Managed Care, Other (non HMO)

## 2015-09-07 ENCOUNTER — Observation Stay (HOSPITAL_BASED_OUTPATIENT_CLINIC_OR_DEPARTMENT_OTHER): Payer: Managed Care, Other (non HMO)

## 2015-09-07 ENCOUNTER — Encounter (HOSPITAL_COMMUNITY): Payer: Medicare Other

## 2015-09-07 DIAGNOSIS — I63311 Cerebral infarction due to thrombosis of right middle cerebral artery: Secondary | ICD-10-CM

## 2015-09-07 DIAGNOSIS — D61818 Other pancytopenia: Secondary | ICD-10-CM

## 2015-09-07 DIAGNOSIS — R29702 NIHSS score 2: Secondary | ICD-10-CM | POA: Diagnosis present

## 2015-09-07 DIAGNOSIS — B20 Human immunodeficiency virus [HIV] disease: Secondary | ICD-10-CM | POA: Diagnosis not present

## 2015-09-07 DIAGNOSIS — R05 Cough: Secondary | ICD-10-CM | POA: Diagnosis not present

## 2015-09-07 DIAGNOSIS — I639 Cerebral infarction, unspecified: Secondary | ICD-10-CM | POA: Diagnosis not present

## 2015-09-07 DIAGNOSIS — E860 Dehydration: Secondary | ICD-10-CM | POA: Diagnosis present

## 2015-09-07 DIAGNOSIS — N179 Acute kidney failure, unspecified: Secondary | ICD-10-CM | POA: Diagnosis not present

## 2015-09-07 DIAGNOSIS — R29810 Facial weakness: Secondary | ICD-10-CM | POA: Diagnosis present

## 2015-09-07 DIAGNOSIS — E785 Hyperlipidemia, unspecified: Secondary | ICD-10-CM | POA: Insufficient documentation

## 2015-09-07 DIAGNOSIS — Z9114 Patient's other noncompliance with medication regimen: Secondary | ICD-10-CM | POA: Diagnosis not present

## 2015-09-07 DIAGNOSIS — I10 Essential (primary) hypertension: Secondary | ICD-10-CM | POA: Diagnosis not present

## 2015-09-07 DIAGNOSIS — Z7982 Long term (current) use of aspirin: Secondary | ICD-10-CM | POA: Diagnosis not present

## 2015-09-07 DIAGNOSIS — R402252 Coma scale, best verbal response, oriented, at arrival to emergency department: Secondary | ICD-10-CM | POA: Diagnosis present

## 2015-09-07 DIAGNOSIS — E042 Nontoxic multinodular goiter: Secondary | ICD-10-CM | POA: Diagnosis present

## 2015-09-07 DIAGNOSIS — R402362 Coma scale, best motor response, obeys commands, at arrival to emergency department: Secondary | ICD-10-CM | POA: Diagnosis present

## 2015-09-07 DIAGNOSIS — G8191 Hemiplegia, unspecified affecting right dominant side: Secondary | ICD-10-CM | POA: Diagnosis present

## 2015-09-07 DIAGNOSIS — R402142 Coma scale, eyes open, spontaneous, at arrival to emergency department: Secondary | ICD-10-CM | POA: Diagnosis present

## 2015-09-07 DIAGNOSIS — Z9119 Patient's noncompliance with other medical treatment and regimen: Secondary | ICD-10-CM | POA: Diagnosis not present

## 2015-09-07 DIAGNOSIS — Z21 Asymptomatic human immunodeficiency virus [HIV] infection status: Secondary | ICD-10-CM | POA: Diagnosis not present

## 2015-09-07 DIAGNOSIS — I6789 Other cerebrovascular disease: Secondary | ICD-10-CM

## 2015-09-07 LAB — RAPID URINE DRUG SCREEN, HOSP PERFORMED
AMPHETAMINES: NOT DETECTED
BENZODIAZEPINES: NOT DETECTED
Barbiturates: NOT DETECTED
COCAINE: NOT DETECTED
Opiates: NOT DETECTED
Tetrahydrocannabinol: NOT DETECTED

## 2015-09-07 LAB — LIPID PANEL
Cholesterol: 93 mg/dL (ref 0–200)
HDL: 30 mg/dL — ABNORMAL LOW (ref >40)
LDL CALC: 58 mg/dL (ref 0–99)
TRIGLYCERIDES: 25 mg/dL (ref <150)
Total CHOL/HDL Ratio: 3.1 RATIO
VLDL: 5 mg/dL (ref 0–40)

## 2015-09-07 LAB — BASIC METABOLIC PANEL
ANION GAP: 6 (ref 5–15)
BUN: 24 mg/dL — ABNORMAL HIGH (ref 6–20)
CHLORIDE: 105 mmol/L (ref 101–111)
CO2: 29 mmol/L (ref 22–32)
CREATININE: 1.4 mg/dL — AB (ref 0.61–1.24)
Calcium: 8.9 mg/dL (ref 8.9–10.3)
GFR calc non Af Amer: 49 mL/min — ABNORMAL LOW (ref >60)
GFR, EST AFRICAN AMERICAN: 57 mL/min — AB (ref >60)
Glucose, Bld: 81 mg/dL (ref 65–99)
POTASSIUM: 4.6 mmol/L (ref 3.5–5.1)
SODIUM: 140 mmol/L (ref 135–145)

## 2015-09-07 LAB — CBC
HEMATOCRIT: 33.4 % — AB (ref 39.0–52.0)
HEMOGLOBIN: 11.5 g/dL — AB (ref 13.0–17.0)
MCH: 28.4 pg (ref 26.0–34.0)
MCHC: 34.4 g/dL (ref 30.0–36.0)
MCV: 82.5 fL (ref 78.0–100.0)
Platelets: 92 10*3/uL — ABNORMAL LOW (ref 150–400)
RBC: 4.05 MIL/uL — AB (ref 4.22–5.81)
RDW: 14.4 % (ref 11.5–15.5)
WBC: 1.9 10*3/uL — AB (ref 4.0–10.5)

## 2015-09-07 LAB — URINALYSIS W MICROSCOPIC (NOT AT ARMC)
BILIRUBIN URINE: NEGATIVE
Glucose, UA: NEGATIVE mg/dL
HGB URINE DIPSTICK: NEGATIVE
KETONES UR: NEGATIVE mg/dL
LEUKOCYTES UA: NEGATIVE
NITRITE: NEGATIVE
PROTEIN: NEGATIVE mg/dL
RBC / HPF: NONE SEEN RBC/hpf (ref 0–5)
Specific Gravity, Urine: 1.01 (ref 1.005–1.030)
pH: 6.5 (ref 5.0–8.0)

## 2015-09-07 LAB — PATHOLOGIST SMEAR REVIEW

## 2015-09-07 LAB — RPR: RPR: NONREACTIVE

## 2015-09-07 MED ORDER — DOLUTEGRAVIR SODIUM 50 MG PO TABS
50.0000 mg | ORAL_TABLET | Freq: Every day | ORAL | Status: DC
  Administered 2015-09-07 – 2015-09-08 (×2): 50 mg via ORAL
  Filled 2015-09-07 (×2): qty 1

## 2015-09-07 MED ORDER — ASPIRIN 325 MG PO TABS
325.0000 mg | ORAL_TABLET | Freq: Every day | ORAL | Status: DC
  Administered 2015-09-08: 325 mg via ORAL
  Filled 2015-09-07: qty 1

## 2015-09-07 MED ORDER — LIDOCAINE HCL (PF) 1 % IJ SOLN
INTRAMUSCULAR | Status: AC
  Filled 2015-09-07: qty 10

## 2015-09-07 MED ORDER — EMTRICITABINE-TENOFOVIR AF 200-25 MG PO TABS
1.0000 | ORAL_TABLET | Freq: Every day | ORAL | Status: DC
  Administered 2015-09-07 – 2015-09-08 (×2): 1 via ORAL
  Filled 2015-09-07 (×2): qty 1

## 2015-09-07 NOTE — Progress Notes (Signed)
OT Evaluation  Clinical Impression: PTA, pt lived alone and was independent with ADL and mobility and worked full time at a [employer] company. Pt states he feels he is close to his baseline. Pt does demonstrate deficits with RUE fine motor/coordination skills. Discussed recommendations to participate with outpt OT at the neuro outpt center to increase safety with return to work, but pt states he feels he is functioning at his baseline since his CVA in August. 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 Will return  to educate pt on a HEP to improve RUE function. Pt very appreciative. Pt safe to D/C home alone when medically stable.    09/07/15 1000  OT Visit Information  Last OT Received On 09/07/15  Assistance Needed +1  History of Present Illness 70 y.o. male with a past medical history significant for HTN, HIV no taking HAART, left MCA lenticulostriate territory infarction 8/16 with residual mild right arm weakness, enlarged thyroid with multiple nodules. Pt presents with right arm weakness, tingling (resolved), stroke on MRI showed A punctate nonhemorrhagic periventricular acute infarct is present  Precautions  Precautions None  Home Living  Family/patient expects to be discharged to: Private residence  Living Arrangements Alone  Available Help at Discharge Friend(s);Available PRN/intermittently (daily)  Type of Home House  Home Access Level entry  Home Layout One level  Bathroom Shower/Tub Tub/shower unit  Social research officer, governmenthower/tub characteristics Door  Bathroom Toilet Standard  Bathroom Accessibility Yes  How Accessible Accessible via walker  Home Equipment None  Additional Comments works - Engineer, materialsprint pharmaceudacal inserts;works around machinery  Prior Function  Level of Independence Independent  Comments He enjoys working on cars and trucks as a hobby and work at [employer]  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 Proximal strength 5/5. distal strength 4/5. Miniimal difficulty with fine motor/coordination skills.  RUE Sensation (pt reports is at baseline)  RUE Coordination decreased fine motor  Lower Extremity Assessment  Lower Extremity Assessment Defer to PT evaluation  Cervical / Trunk Assessment  Cervical / Trunk Assessment Normal  ADL  Overall ADL's  Modified independent;At baseline  Vision- History  Baseline Vision/History Wears glasses  Wears Glasses Distance only (and for work)  Vision- Assessment  Vision Assessment? No apparent visual deficits;Yes  Eye Alignment WFL  Ocular Range of Motion [facility]  Alignment/Gaze Preference WDL  Tracking/Visual Pursuits Able to track stimulus in all quads without difficulty  Saccades [facility]  Convergence WFL  Visual Fields No apparent deficits  Praxis  Praxis tested? WFL  Bed Mobility  Overal bed mobility Independent  Transfers  Overall transfer level Independent  Balance  Overall balance assessment Independent  Standardized Balance Assessment  Standardized Balance Assessment  Dynamic Gait Index  Dynamic Gait Index  Level Surface 3  Change in Gait Speed 3  Gait with Horizontal Head Turns 3  Gait with Vertical Head Turns 3  Gait and Pivot Turn 3  Step Over Obstacle 3  Step Around Obstacles 3  Steps 3 (simulated)  Total Score 24  OT - End of Session  Equipment Utilized  During Treatment Gait belt  Activity Tolerance Patient tolerated treatment well  Patient left in bed;with call bell/phone within reach  Nurse Communication Mobility status;Other (comment) (D/C recommendations)  OT Assessment  OT Therapy Diagnosis  Generalized weakness  OT Recommendation/Assessment Patient needs continued OT Services  OT Problem List Decreased strength;Decreased coordination  Barriers to Discharge Decreased caregiver support  OT Plan  OT Frequency (ACUTE ONLY) Min 2X/week  OT Treatment/Interventions (ACUTE ONLY) Therapeutic exercise;Neuromuscular education;Therapeutic activities;Patient/family education  OT Recommendation  Follow Up Recommendations Other (comment)  OT Equipment None recommended by OT  Individuals Consulted  Consulted and Agree with Results and Recommendations Patient  Acute Rehab OT Goals  Patient Stated Goal to go home and get back to work  OT Goal Formulation With patient  Time For Goal Achievement 09/14/15  Potential to Achieve Goals Good  OT Time Calculation  OT Start Time (ACUTE ONLY) 1018  OT Stop Time (ACUTE ONLY) 1045  OT Time Calculation (min) 27 min  OT G-codes **NOT FOR INPATIENT CLASS**  Functional Assessment Tool Used clinical judgement  Functional Limitation Self care  Self Care Current Status (Z6109) CI  Self Care Goal Status (U0454) CI  OT General Charges  $OT Visit 1 Procedure  OT Evaluation  $OT Eval Moderate Complexity 1 Procedure  OT Treatments  $Self Care/Home Management  8-22 mins  Written Expression  Dominant Hand Right  [facility], OTR/L  [phone] 09/07/2015

## 2015-09-07 NOTE — ED Notes (Addendum)
A regular diet order taken for lunch.

## 2015-09-07 NOTE — ED Notes (Signed)
PT at bedside.

## 2015-09-07 NOTE — Progress Notes (Signed)
OT Progress Note  Pt seen to begin HEP. Written HEP given. Will follow acutely for RUE rehab and facilitate safe D/C home. Continue to recommend D/C home when medically stable   09/07/15 1400  OT Visit Information  Last OT Received On 09/07/15  Assistance Needed +1  History of Present Illness 70 y.o. male with a past medical history significant for HTN, HIV no taking HAART, left MCA lenticulostriate territory infarction 8/16 with residual mild right arm weakness, enlarged thyroid with multiple nodules. Pt presents with right arm weakness, tingling (resolved), stroke on MRI showed A punctate nonhemorrhagic periventricular acute infarct is present  OT Time Calculation  OT Start Time (ACUTE ONLY) 1150  OT Stop Time (ACUTE ONLY) 1205  OT Time Calculation (min) 15 min  Precautions  Precautions None  Pain Assessment  Pain Assessment No/denies pain  Cognition  Arousal/Alertness Awake/alert  Behavior During Therapy WFL for tasks assessed/performed  Overall Cognitive Status Within Functional Limits for tasks assessed  Exercises  Exercises Other exercises  Other Exercises  Other Exercises theraputty ex - med - written handout reviewed  Other Exercises BUE coordination activities - written activities given  Other Exercises fine motor/coordination HEP reviewed  OT - End of Session  Activity Tolerance Patient tolerated treatment well  Patient left in bed;with call bell/phone within reach  Nurse Communication Mobility status;Other (comment)  OT Assessment/Plan  OT Plan Discharge plan remains appropriate  OT Frequency (ACUTE ONLY) Min 2X/week  Follow Up Recommendations Other (comment) (see prior note)  OT Equipment None recommended by OT  OT Goal Progression  Progress towards OT goals Progressing toward goals  Acute Rehab OT Goals  Patient Stated Goal to go home and get back to work  OT Goal Formulation With patient  Time For Goal Achievement 09/14/15  Potential to Achieve Goals Good   ADL Goals  Pt/caregiver will Perform Home Exercise Program Right Upper extremity;With written HEP provided;With theraputty;Independently  OT General Charges  $OT Visit 1 Procedure  OT Treatments  $Therapeutic Activity 8-22 mins  [facility] Taeshawn Helfman, OTR/L  [phone] 09/07/2015

## 2015-09-07 NOTE — Progress Notes (Signed)
Pt admitted to 5M14 at this time.  He is alert and oriented; denies pain or discomfort.  Tele placed on patient and CCMD called by RN and NT to verify.  Family member at bedside.  Bed alarm on and call bell within reach.  Pt verbalizes understanding to call before attempting to get out of bed.  Will continue to monitor and assess

## 2015-09-07 NOTE — ED Notes (Signed)
Pharmacy called for medications. 

## 2015-09-07 NOTE — Progress Notes (Signed)
Subjective: Patient seen and examined this morning on rounds.  No acute events overnight since admission.  He has no complaints this morning and reports resolution of symptoms that brought him to the hospital.  Objective: Vital signs in last 24 hours: Filed Vitals:   09/07/15 0500 09/07/15 0814 09/07/15 0912 09/07/15 0957  BP: 149/92 152/95 142/85 138/82  Pulse: 65 71 68 72  Temp:      TempSrc:      Resp:  18 16 18   Height:      Weight:      SpO2: 99% 98% 97% 97%   Weight change:  No intake or output data in the 24 hours ending 09/07/15 1035  General: resting in bed, no distress HEENT: EOMI, no scleral icterus Cardiac: RRR, no rubs, murmurs or gallops Pulm: no increased work of breathing, normal chest rise and fall Ext: warm and well perfused, no pedal edema Neuro: alert and oriented, no focal deficits, no difficulty with speech or comprehension  Lab Results: Basic Metabolic Panel:  Recent Labs Lab 09/06/15 1225 09/06/15 1233 09/07/15 0521  NA 140 142 140  K 4.5 4.3 4.6  CL 107 103 105  CO2 26  --  29  GLUCOSE 98 92 81  BUN 27* 29* 24*  CREATININE 1.51* 1.60* 1.40*  CALCIUM 9.0  --  8.9   Liver Function Tests:  Recent Labs Lab 09/06/15 1225  AST 25  ALT 23  ALKPHOS 84  BILITOT 0.5  PROT 6.9  ALBUMIN 3.2*   No results for input(s): LIPASE, AMYLASE in the last 168 hours. No results for input(s): AMMONIA in the last 168 hours. CBC:  Recent Labs Lab 09/06/15 1225 09/06/15 1233 09/07/15 0521  WBC 2.3*  --  1.9*  NEUTROABS 0.9*  --   --   HGB 11.0* 12.2* 11.5*  HCT 33.3* 36.0* 33.4*  MCV 84.5  --  82.5  PLT 86*  --  92*   Cardiac Enzymes: No results for input(s): CKTOTAL, CKMB, CKMBINDEX, TROPONINI in the last 168 hours. BNP: No results for input(s): PROBNP in the last 168 hours. D-Dimer: No results for input(s): DDIMER in the last 168 hours. CBG:  Recent Labs Lab 09/06/15 1742  GLUCAP 86   Hemoglobin A1C: No results for input(s):  HGBA1C in the last 168 hours. Fasting Lipid Panel:  Recent Labs Lab 09/07/15 0521  CHOL 93  HDL 30*  LDLCALC 58  TRIG 25  CHOLHDL 3.1   Thyroid Function Tests: No results for input(s): TSH, T4TOTAL, FREET4, T3FREE, THYROIDAB in the last 168 hours. Coagulation:  Recent Labs Lab 09/06/15 1225  LABPROT 13.6  INR 1.02   Anemia Panel: No results for input(s): VITAMINB12, FOLATE, FERRITIN, TIBC, IRON, RETICCTPCT in the last 168 hours. Urine Drug Screen: Drugs of Abuse     Component Value Date/Time   LABOPIA NONE DETECTED 09/07/2015 0658   COCAINSCRNUR NONE DETECTED 09/07/2015 0658   LABBENZ NONE DETECTED 09/07/2015 0658   AMPHETMU NONE DETECTED 09/07/2015 0658   THCU NONE DETECTED 09/07/2015 0658   LABBARB NONE DETECTED 09/07/2015 0658    Alcohol Level: No results for input(s): ETH in the last 168 hours. Urinalysis:  Recent Labs Lab 09/07/15 0658  COLORURINE YELLOW  LABSPEC 1.010  PHURINE 6.5  GLUCOSEU NEGATIVE  HGBUR NEGATIVE  BILIRUBINUR NEGATIVE  KETONESUR NEGATIVE  PROTEINUR NEGATIVE  NITRITE NEGATIVE  LEUKOCYTESUR NEGATIVE     Micro Results: No results found for this or any previous visit (from the past 240 hour(s)).  Studies/Results: Dg Chest 2 View  09/07/2015  CLINICAL DATA:  Cough. EXAM: CHEST  2 VIEW COMPARISON:  04/20/2015 FINDINGS: The heart size and mediastinal contours are within normal limits. Both lungs are clear. The visualized skeletal structures are unremarkable. Mild scarring in the lung bases unchanged. IMPRESSION: No active cardiopulmonary disease. Electronically Signed   By: Marlan Palau M.D.   On: 09/07/2015 08:08   Ct Head Wo Contrast  09/06/2015  CLINICAL DATA:  Acute right handed numbness. EXAM: CT HEAD WITHOUT CONTRAST TECHNIQUE: Contiguous axial images were obtained from the base of the skull through the vertex without intravenous contrast. COMPARISON:  CT scan of April 20, 2015. FINDINGS: Bony calvarium appears intact. Mild chronic  ischemic white matter disease is noted. Old left periventricular white matter infarction is noted. Stable old infarction adjacent to right frontal horn. No mass effect or midline shift is noted. Ventricular size is within normal limits. There is no evidence of mass lesion, hemorrhage or acute infarction. IMPRESSION: Mild chronic ischemic white matter disease. Old left periventricular white matter infarction and old infarction adjacent to right frontal horn infarction. No acute intracranial abnormality seen. Electronically Signed   By: Lupita Raider, M.D.   On: 09/06/2015 13:59   Mr Brain Wo Contrast  09/06/2015  CLINICAL DATA:  Right arm weakness and numbness beginning at 6 o\'clock a.m. today. CVA 6 months ago. Persistent right-sided weakness. EXAM: MRI HEAD WITHOUT CONTRAST TECHNIQUE: Multiplanar, multiecho pulse sequences of the brain and surrounding structures were obtained without intravenous contrast. COMPARISON:  CT head without contrast from the same day. MRI brain 04/20/2015. FINDINGS: A punctate nonhemorrhagic periventricular acute infarct is present along the posterior right temporal and occipital lobe. No other acute infarct is present. The previously noted infarct of the left lentiform nucleus and corona radiata is again noted. There is a remote infarct of the anterior right coronal radiata. Moderate periventricular and subcortical white matter disease is otherwise stable. The ventricles are of normal size. White matter changes extend into the brainstem. 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 The internal auditory canals are within normal limits. Flow is present in the major intracranial arteries. Globes and orbits are intact. Mild mucosal thickening is present in the ethmoid air cells. A prominent polyp or mucous retention cyst is present in the inferior right maxillary sinus. There is mild mucosal thickening  along the inferior left maxillary sinus. The remaining paranasal sinuses and the mastoid air cells are clear. The skullbase is unremarkable. Multiple punctate foci of susceptibility are again noted within the thalami bilaterally as well as the posterior left lentiform nucleus. There are at least 2 punctate foci of remote hemorrhage in the left parietal occipital lobe and a single lesion in the left temporal lobe. A left paramedian pontine remote hemorrhage is noted. IMPRESSION: 1. Punctate nonhemorrhagic infarct along the posterior right temporal and occipital lobe adjacent to the ventricle. 2. Expected evolution of remote infarct involving the left lentiform nucleus and corona radiata. 3. Multiple foci of remote hemorrhage involving the thalami bilaterally as well as the left temporal and occipital lobe. Remote hemorrhage in the left paramedian pons. 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 Amyloid angiopathy is considered. 4. Mild stable sinus disease. Electronically Signed   By: Marin Roberts M.D.   On: 09/06/2015 19:24   Medications: Scheduled Meds: .  stroke: mapping our early stages of recovery book   Does not  apply Once  . aspirin EC  81 mg Oral Daily  . atorvastatin  40 mg Oral Daily  . sulfamethoxazole-trimethoprim  1 tablet Oral Daily   Continuous Infusions:  PRN Meds:.acetaminophen **OR** acetaminophen, sodium chloride Assessment/Plan: Principal Problem:   CVA (cerebral infarction) Active Problems:   Essential hypertension   HIV disease (HCC)   AKI (acute kidney injury) (HCC)   Pancytopenia (HCC)  CVA: Mr. Vultaggio has a history of prior left MCA lenticulostriate territory infarct who came in yesterday with right-sided weakness that has since completely resolved and currently has an unremarkable neuro exam with no deficits appreciated.  MRI obtained yesterday showed expected evolution of remote old infarct, multiple foci of remote hemorrhage bilaterally, as well  as a new posterior lacunar infarct along the right temporal and occipital lobe adjacent to the ventricle.  This does not anatomically explain his ipsilateral transient symptoms.  Concern arises for possible embolic source given that he has multiple old hemorrhagic foci, as well as a new infarct not correlating with his transient neurologic symptoms as described above.  TTE was done 04/2015 showing normal LV systolic function, grade 1 diastolic dysfunction, moderate biatrial enlargement, calcified aortic valve with mildeccentric AI, trace MR and TR. TEE was not done. No history of documented atrial fibrillation.  Unsure what role untreated HIV infection may be playing in contributing as well. - Neurology following, appreciate their recommendations - Continue Asprin 81mg  daily - Continue Atorvastatin 40mg  daily - Allowing for permissive HTN - TTE pending, may need to consider TEE for better evaluation of embolic source.  Will discuss with neurology. - Carotid dopplers pending - PT, OT, SLP evaluations pending - UDS negative - Urinalysis unremarkable - Neuro checks - Monitor on telemetry  HIV:  Initially diagnosed in August 2016 found incidentally on CVA work-up.  Saw Dr. Luciana Axe in clinic in October 2016 who planned to initiate Tivicay and Descovy as well as TMP-SMX for opportunistic infection prophylaxis.  CD4/VL was 190/15,000 in September 2016.  However, patient did not start these meds and states he did not receive them.  - Will discuss with Dr. Luciana Axe about making sure he is able to access medications at discharge - For now, will start Tivicay and Descovy - Continue TMP-SMX - Check CD4, VL - Check RPR  Acute Kidney Injury: baseline of 0.9 in early 2016, elevated to 2.1 in October and not lower than 1.2 since.  Potassium has been normal.  Creatine 1.5 on admission. - Creatine 1.4 this morning, trend BMP  HTN - Hold home meds to allow for permissive HTN  Dispo: Disposition is deferred at this  time, awaiting improvement of current medical problems.    The patient does have a current PCP (Darreld Mclean, MD) and does need an [facility] hospital follow-up appointment after discharge.  The patient does not have transportation limitations that hinder transportation to clinic appointments.  .Services Needed at time of discharge: Y = Yes, Blank = No PT:   OT:   RN:   Equipment:   Other:       Gwynn Burly, DO 09/07/2015, 10:35 AM
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
## 2015-09-07 NOTE — ED Notes (Signed)
Pt requested urinal.  Used independently.

## 2015-09-07 NOTE — Progress Notes (Signed)
Echocardiogram 2D Echocardiogram has been performed.  Nolon RodBrown, Tony 09/07/2015, 10:43 AM

## 2015-09-07 NOTE — ED Notes (Signed)
Echo performed at bedside.

## 2015-09-07 NOTE — Progress Notes (Addendum)
STROKE TEAM PROGRESS NOTE   HISTORY Thomas [name] is an 70 y.o. male with a past medical history significant for HTN, HIV not taking HAART, left MCA lenticulostriate territory infarction 8/16 with residual mild right arm weakness, enlarged thyroid with multiple nodules, comes in for evaluation of right arm weakness numbness. Patient said that he woke up today 09/06/15 feeling well, but around 7 am (LKW) started having a " weaker sensation and slight tingling of my right arm" which lasted for the most part of the morning and gradually subside. He said that in his way to work he couldn't hold the steering wheel well because the right hand was weak and was having a great deal of difficulty utilizing his right hand at work. Stated that he spoke to a fireman that volunteers at work and was advised to come to the ED for evaluation. In the ED, Thomas [name] said his right arm and hand " feels strong like it was before". Denies associated HA, vertigo, double vision, difficulty swallowing, slurred speech, imbalance, language or vision impairment. MRI brain showed a punctate acute area of ischemia involving the right posterior temporal-occipital region. On aspirin daily. Patient was not administered TPA secondary to symptoms resolved.   SUBJECTIVE (INTERVAL HISTORY) No family is at the bedside.  Overall he feels his condition is gradually improving. He stated that his right arm weakness and numbness have been resolved.   OBJECTIVE Temp:  [98.1 F (36.7 C)-98.6 F (37 C)] 98.1 F (36.7 C) (01/04 1703) Pulse Rate:  [51-74] 68 (01/05 0912) Cardiac Rhythm:  [-]  Resp:  [14-25] 16 (01/05 0912) BP: (120-182)/(77-101) 142/85 mmHg (01/05 0912) SpO2:  [94 %-100 %] 97 % (01/05 0912) Weight:  [67.495 kg (148 lb 12.8 oz)] 67.495 kg (148 lb 12.8 oz) (01/04 1224)  CBC:  Recent Labs Lab 09/06/15 1225 09/06/15 1233 09/07/15 0521  WBC 2.3*  --  1.9*  NEUTROABS 0.9*  --   --   HGB 11.0* 12.2* 11.5*  HCT 33.3* 36.0*  33.4*  MCV 84.5  --  82.5  PLT 86*  --  92*    Basic Metabolic Panel:  Recent Labs Lab 09/06/15 1225 09/06/15 1233 09/07/15 0521  NA 140 142 140  K 4.5 4.3 4.6  CL 107 103 105  CO2 26  --  29  GLUCOSE 98 92 81  BUN 27* 29* 24*  CREATININE 1.51* 1.60* 1.40*  CALCIUM 9.0  --  8.9    Lipid Panel:    Component Value Date/Time   CHOL 93 09/07/2015 0521   TRIG 25 09/07/2015 0521   HDL 30* 09/07/2015 0521   CHOLHDL 3.1 09/07/2015 0521   VLDL 5 09/07/2015 0521   LDLCALC 58 09/07/2015 0521   HgbA1c:  Lab Results  Component Value Date   HGBA1C 6.0* 04/21/2015   Urine Drug Screen:    Component Value Date/Time   LABOPIA NONE DETECTED 09/07/2015 0658   COCAINSCRNUR NONE DETECTED 09/07/2015 0658   LABBENZ NONE DETECTED 09/07/2015 0658   AMPHETMU NONE DETECTED 09/07/2015 0658   THCU NONE DETECTED 09/07/2015 0658   LABBARB NONE DETECTED 09/07/2015 0658      IMAGING I have personally reviewed the radiological images below and agree with the radiology interpretations.  Dg Chest 2 View 09/07/2015   No active cardiopulmonary disease.   Ct Head Wo Contrast 09/06/2015   Mild chronic ischemic white matter disease. Old left periventricular white matter infarction and old infarction adjacent to right frontal horn infarction. No acute intracranial  abnormality seen.   Thomas Brain Wo Contrast 09/06/2015   1. Punctate nonhemorrhagic infarct along the posterior right temporal and occipital lobe adjacent to the ventricle. 2. Expected evolution of remote infarct involving the left lentiform nucleus and corona radiata. 3. Multiple foci of remote hemorrhage involving the thalami bilaterally as well as the left temporal and occipital lobe. Remote hemorrhage in the left paramedian pons. 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 Amyloid angiopathy is considered. 4. Mild stable sinus disease.   TTE - - Left ventricle: The cavity size was normal. Wall thickness was increased in a  pattern of mild LVH. Systolic function was normal. The estimated ejection fraction was in the range of 50% to 55%. Wall motion was normal; there were no regional wall motion abnormalities. Doppler parameters are consistent with abnormal left ventricular relaxation (grade 1 diastolic dysfunction). - Aortic valve: There was mild to moderate regurgitation. - Pulmonary arteries: Systolic pressure was mildly increased. PA peak pressure: 35 mm Hg (S). Impressions: - Normal LV systolic function; grade 1 diastolic dysfunction; mild LVH; sclerotic aortic valve with mild to moderate Thomas; mild TR with mildly elevated pulmonary pressure.  CUS - pending   Physical exam  Temp:  [98.6 F (37 C)] 98.6 F (37 C) (01/05 1711) Pulse Rate:  [51-74] 57 (01/05 1711) Resp:  [14-25] 20 (01/05 1711) BP: (120-171)/(77-101) 164/91 mmHg (01/05 1711) SpO2:  [94 %-100 %] 100 % (01/05 1711) Weight:  [148 lb (67.132 kg)] 148 lb (67.132 kg) (01/05 1730)  General - Well nourished, well developed, in no apparent distress.  Ophthalmologic - Sharp disc margins OU.   Cardiovascular - Regular rate and rhythm with no murmur.  Mental Status -  Level of arousal and orientation to time, place, and person were intact. Language including expression, naming, repetition, comprehension was assessed and found intact. Fund of Knowledge was assessed and was intact.  Cranial Nerves II - XII - II - Visual field intact OU. III, IV, VI - Extraocular movements intact. V - Facial sensation intact bilaterally. VII - right mild flattening of nasolabial fold. VIII - Hearing & vestibular intact bilaterally. X - Palate elevates symmetrically.Marland Kitchen XI - Chin turning & shoulder shrug intact bilaterally XII - Tongue protrusion intact.  Motor Strength - The patient's strength was normal in all extremities and pronator drift was absent. Bulk was normal and fasciculations were absent.  Motor Tone - Muscle tone was  assessed at the neck and appendages and was normal.  Reflexes - The patient's reflexes were 1+ in all extremities and he had no pathological reflexes.  Sensory - Light touch, temperature/pinprick were assessed and were symmetrical.   Coordination - The patient had normal movements in the hands with no ataxia or dysmetria. Tremor was absent.  Gait and Station - not tested due to safety concerns.   ASSESSMENT/PLAN Thomas [name] is a 70 y.o. 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 He did not receive IV t-PA due to symptoms resolved.   Recrudescence of old left BG/CR infarct 04/20/15  No acute infarct at left hemisphere this time to explain right sided symptoms  Pt currently back to baseline  Likely related to his worsening HIV due to noncompliance with HARRT with financial difficulty  WBC drop from 3.4 to 1.9  HARRT will be restarted and pt counseled on medication compliance.  Stroke:  Incidental finding, right temporal  horn WM punctate infarct,  Likely due to small vessel disease source related to HIV. Due to recent relatively large lacunar infarct in 04/2015, will recommend 30 day cardiac monitoring to rule out cardioembolic source.  MRI  Right temporal horn WM punctate infarct  Carotid Doppler  pending  2D Echo  EF 50-55%  LDL 58  HgbA1c 6.0 in Aug  for VTE prophylaxis - SCDs  Recommend 30 day cardiac event monitoring as outpt to rule out afib.  Diet Heart Room service appropriate?: Yes; Fluid consistency:: Thin  aspirin 325 mg daily prior to admission, now on ASA 325mg  daily. Continue ASA on discharge.   Patient counseled to be compliant with his antithrombotic medications  Ongoing aggressive stroke risk factor management  Therapy recommendations:  pending  Disposition:  Pending  HIV  Off meds due to no refills  Will put  back on HARRT  Social worker consult for financial support  WBC 3.4->1.9  UA neg  CXR no acute process  Viral load pending  CD4 pending  Hypertension  Stable  Permissive hypertension (OK if < 220/120) but gradually normalize in 5-7 days  Hyperlipidemia  Home meds:  lipitor 40,  resumed in hospital  LDL 58, goal < 70  Continue statin at discharge  Other Stroke Risk Factors  Advanced age  Hx stroke/TIA  04/2015 - left MCA lenticulostriate territory infarction with residual mild right arm weakness  Other Active Problems  Enlarged thyroid  Ectatic thoracic aorta  Hospital day #   Marvel Plan, MD PhD Stroke Neurology 09/07/2015 5:55 PM   To contact Stroke Continuity provider, please refer to [email or web address]. After hours, contact General Neurology

## 2015-09-07 NOTE — Progress Notes (Signed)
PT Cancellation Note  Patient Details Name: Thomas [name] MRN: [id number] DOB: 11/19/1945   Cancelled Treatment:    Reason Eval/Treat Not Completed: PT screened, no needs identified, will sign off.  Discussed pt case with OT and reviewed evaluation. It appears that pt is independent with mobility with acute deficits only in RUE. Will defer follow-up recommendations to OT. If needs change, please reconsult.   Conni SlipperKirkman, Kaarin Pardy 09/07/2015, 11:36 AM   Conni SlipperLaura Authur Cubit, PT, DPT Acute Rehabilitation Services Pager: [phone]

## 2015-09-07 NOTE — ED Notes (Signed)
Called pharmacy about medications being sent.

## 2015-09-08 ENCOUNTER — Inpatient Hospital Stay (HOSPITAL_COMMUNITY): Payer: Managed Care, Other (non HMO)

## 2015-09-08 ENCOUNTER — Other Ambulatory Visit: Payer: Self-pay | Admitting: Physician Assistant

## 2015-09-08 ENCOUNTER — Encounter (HOSPITAL_COMMUNITY): Payer: Self-pay | Admitting: General Practice

## 2015-09-08 DIAGNOSIS — I639 Cerebral infarction, unspecified: Secondary | ICD-10-CM

## 2015-09-08 DIAGNOSIS — I6349 Cerebral infarction due to embolism of other cerebral artery: Secondary | ICD-10-CM

## 2015-09-08 LAB — BASIC METABOLIC PANEL
ANION GAP: 7 (ref 5–15)
BUN: 22 mg/dL — AB (ref 6–20)
CALCIUM: 8.6 mg/dL — AB (ref 8.9–10.3)
CO2: 25 mmol/L (ref 22–32)
Chloride: 106 mmol/L (ref 101–111)
Creatinine, Ser: 1.34 mg/dL — ABNORMAL HIGH (ref 0.61–1.24)
GFR calc Af Amer: >60 mL/min (ref >60)
GFR, EST NON AFRICAN AMERICAN: 52 mL/min — AB (ref >60)
GLUCOSE: 91 mg/dL (ref 65–99)
Potassium: 4.1 mmol/L (ref 3.5–5.1)
SODIUM: 138 mmol/L (ref 135–145)

## 2015-09-08 LAB — T-HELPER CELLS (CD4) COUNT (NOT AT ARMC)
CD4 % Helper T Cell: 13 % — ABNORMAL LOW (ref 33–55)
CD4 T Cell Abs: 170 /uL — ABNORMAL LOW (ref 400–2700)

## 2015-09-08 LAB — HIV-1 RNA QUANT-NO REFLEX-BLD
HIV 1 RNA Quant: 119000 copies/mL
LOG10 HIV-1 RNA: 5.076 log10copy/mL

## 2015-09-08 LAB — HEMOGLOBIN A1C
Hgb A1c MFr Bld: 5.8 % — ABNORMAL HIGH (ref 4.8–5.6)
MEAN PLASMA GLUCOSE: 120 mg/dL

## 2015-09-08 MED ORDER — ASPIRIN 81 MG PO TBEC: 81.0000 mg | DELAYED_RELEASE_TABLET | Freq: Every day | ORAL | Status: DC

## 2015-09-08 MED ORDER — ATORVASTATIN CALCIUM 40 MG PO TABS: ORAL_TABLET | ORAL | Status: DC

## 2015-09-08 MED ORDER — SULFAMETHOXAZOLE-TRIMETHOPRIM 800-160 MG PO TABS: 1.0000 | ORAL_TABLET | Freq: Every day | ORAL | Status: DC

## 2015-09-08 MED ORDER — EMTRICITABINE-TENOFOVIR AF 200-25 MG PO TABS: 1.0000 | ORAL_TABLET | Freq: Every day | ORAL | Status: DC

## 2015-09-08 MED ORDER — LISINOPRIL-HYDROCHLOROTHIAZIDE 20-12.5 MG PO TABS: 2.0000 | ORAL_TABLET | Freq: Every day | ORAL | Status: DC

## 2015-09-08 MED ORDER — DOLUTEGRAVIR SODIUM 50 MG PO TABS: 50.0000 mg | ORAL_TABLET | Freq: Every day | ORAL | Status: DC

## 2015-09-08 NOTE — Progress Notes (Signed)
Subjective: Patient seen and examined this morning on rounds.  No acute events overnight.  He has no complaints today.  Has not had any more episodes of weakness or numbness.  Objective: Vital signs in last 24 hours: Filed Vitals:   09/07/15 2150 09/08/15 0136 09/08/15 0522 09/08/15 0949  BP: 128/78 134/93 141/84 164/88  Pulse: 63 53 62 75  Temp: 98.1 F (36.7 C) 98.2 F (36.8 C) 97.8 F (36.6 C) 98.2 F (36.8 C)  TempSrc: Oral Oral Oral Oral  Resp: 20 20 18 18   Height:      Weight:      SpO2: 96% 97% 95% 97%   Weight change: -12.8 oz (-0.363 kg)  Intake/Output Summary (Last 24 hours) at 09/08/15 1028 Last data filed at 09/08/15 0900  Gross per 24 hour  Intake    240 ml  Output      0 ml  Net    240 ml    General: resting in bed, no distress HEENT: EOMI, no scleral icterus Cardiac: RRR, no rubs, murmurs or gallops Pulm: no increased work of breathing, normal chest rise and fall Ext: warm and well perfused, no pedal edema Neuro: alert and oriented, no focal deficits, no difficulty with speech or comprehension, sensation intact and symmetrical to light touch  Lab Results: Basic Metabolic Panel:  Recent Labs Lab 09/07/15 0521 09/08/15 0301  NA 140 138  K 4.6 4.1  CL 105 106  CO2 29 25  GLUCOSE 81 91  BUN 24* 22*  CREATININE 1.40* 1.34*  CALCIUM 8.9 8.6*   Liver Function Tests:  Recent Labs Lab 09/06/15 1225  AST 25  ALT 23  ALKPHOS 84  BILITOT 0.5  PROT 6.9  ALBUMIN 3.2*   No results for input(s): LIPASE, AMYLASE in the last 168 hours. No results for input(s): AMMONIA in the last 168 hours. CBC:  Recent Labs Lab 09/06/15 1225 09/06/15 1233 09/07/15 0521  WBC 2.3*  --  1.9*  NEUTROABS 0.9*  --   --   HGB 11.0* 12.2* 11.5*  HCT 33.3* 36.0* 33.4*  MCV 84.5  --  82.5  PLT 86*  --  92*   Cardiac Enzymes: No results for input(s): CKTOTAL, CKMB, CKMBINDEX, TROPONINI in the last 168 hours. BNP: No results for input(s): PROBNP in the last  168 hours. D-Dimer: No results for input(s): DDIMER in the last 168 hours. CBG:  Recent Labs Lab 09/06/15 1742  GLUCAP 86   Hemoglobin A1C:  Recent Labs Lab 09/07/15 0521  HGBA1C 5.8*   Fasting Lipid Panel:  Recent Labs Lab 09/07/15 0521  CHOL 93  HDL 30*  LDLCALC 58  TRIG 25  CHOLHDL 3.1   Thyroid Function Tests: No results for input(s): TSH, T4TOTAL, FREET4, T3FREE, THYROIDAB in the last 168 hours. Coagulation:  Recent Labs Lab 09/06/15 1225  LABPROT 13.6  INR 1.02   Anemia Panel: No results for input(s): VITAMINB12, FOLATE, FERRITIN, TIBC, IRON, RETICCTPCT in the last 168 hours. Urine Drug Screen: Drugs of Abuse     Component Value Date/Time   LABOPIA NONE DETECTED 09/07/2015 0658   COCAINSCRNUR NONE DETECTED 09/07/2015 0658   LABBENZ NONE DETECTED 09/07/2015 0658   AMPHETMU NONE DETECTED 09/07/2015 0658   THCU NONE DETECTED 09/07/2015 0658   LABBARB NONE DETECTED 09/07/2015 0658    Alcohol Level: No results for input(s): ETH in the last 168 hours. Urinalysis:  Recent Labs Lab 09/07/15 0658  COLORURINE YELLOW  LABSPEC 1.010  PHURINE 6.5  GLUCOSEU  NEGATIVE  HGBUR NEGATIVE  BILIRUBINUR NEGATIVE  KETONESUR NEGATIVE  PROTEINUR NEGATIVE  NITRITE NEGATIVE  LEUKOCYTESUR NEGATIVE     Micro Results: No results found for this or any previous visit (from the past 240 hour(s)). Studies/Results: Dg Chest 2 View  09/07/2015  CLINICAL DATA:  Cough. EXAM: CHEST  2 VIEW COMPARISON:  04/20/2015 FINDINGS: The heart size and mediastinal contours are within normal limits. Both lungs are clear. The visualized skeletal structures are unremarkable. Mild scarring in the lung bases unchanged. IMPRESSION: No active cardiopulmonary disease. Electronically Signed   By: Marlan Palau M.D.   On: 09/07/2015 08:08   Ct Head Wo Contrast  09/06/2015  CLINICAL DATA:  Acute right handed numbness. EXAM: CT HEAD WITHOUT CONTRAST TECHNIQUE: Contiguous axial images were  obtained from the base of the skull through the vertex without intravenous contrast. COMPARISON:  CT scan of April 20, 2015. FINDINGS: Bony calvarium appears intact. Mild chronic ischemic white matter disease is noted. Old left periventricular white matter infarction is noted. Stable old infarction adjacent to right frontal horn. No mass effect or midline shift is noted. Ventricular size is within normal limits. There is no evidence of mass lesion, hemorrhage or acute infarction. IMPRESSION: Mild chronic ischemic white matter disease. Old left periventricular white matter infarction and old infarction adjacent to right frontal horn infarction. No acute intracranial abnormality seen. Electronically Signed   By: Lupita Raider, M.D.   On: 09/06/2015 13:59   Mr Brain Wo Contrast  09/06/2015  CLINICAL DATA:  Right arm weakness and numbness beginning at 6 o\'clock a.m. today. CVA 6 months ago. Persistent right-sided weakness. EXAM: MRI HEAD WITHOUT CONTRAST TECHNIQUE: Multiplanar, multiecho pulse sequences of the brain and surrounding structures were obtained without intravenous contrast. COMPARISON:  CT head without contrast from the same day. MRI brain 04/20/2015. FINDINGS: A punctate nonhemorrhagic periventricular acute infarct is present along the posterior right temporal and occipital lobe. No other acute infarct is present. The previously noted infarct of the left lentiform nucleus and corona radiata is again noted. There is a remote infarct of the anterior right coronal radiata. Moderate periventricular and subcortical white matter disease is otherwise stable. The ventricles are of normal size. White matter changes extend into the brainstem. 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 The internal auditory canals are within normal limits. Flow is present in the major intracranial arteries. Globes and orbits are intact. Mild  mucosal thickening is present in the ethmoid air cells. A prominent polyp or mucous retention cyst is present in the inferior right maxillary sinus. There is mild mucosal thickening along the inferior left maxillary sinus. The remaining paranasal sinuses and the mastoid air cells are clear. The skullbase is unremarkable. Multiple punctate foci of susceptibility are again noted within the thalami bilaterally as well as the posterior left lentiform nucleus. There are at least 2 punctate foci of remote hemorrhage in the left parietal occipital lobe and a single lesion in the left temporal lobe. A left paramedian pontine remote hemorrhage is noted. IMPRESSION: 1. Punctate nonhemorrhagic infarct along the posterior right temporal and occipital lobe adjacent to the ventricle. 2. Expected evolution of remote infarct involving the left lentiform nucleus and corona radiata. 3. Multiple foci of remote hemorrhage involving the thalami bilaterally as well as the left temporal and occipital lobe. Remote hemorrhage in the left paramedian pons. 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 4. Mild stable sinus disease. Electronically Signed   By: Marin Roberts M.D.   On: 09/06/2015 19:24   Medications: Scheduled Meds: .  stroke: mapping our early stages of recovery book   Does not apply Once  . aspirin  325 mg Oral Daily  . atorvastatin  40 mg Oral Daily  . dolutegravir  50 mg Oral Daily  . emtricitabine-tenofovir AF  1 tablet Oral Daily  . sulfamethoxazole-trimethoprim  1 tablet Oral Daily   Continuous Infusions:  PRN Meds:.acetaminophen **OR** acetaminophen, sodium chloride Assessment/Plan: Principal Problem:   CVA (cerebral infarction) Active Problems:   Essential hypertension   HIV disease (HCC)   AKI (acute kidney injury) (HCC)   Pancytopenia (HCC)   HLD (hyperlipidemia)   Acute CVA (cerebrovascular accident) (HCC)  CVA: Mr. Galentine had no acute  infarct at left hemisphere to to explain right-sided symptoms at admission but is currently back to baseline.  Possibly TIA.  Incidental finding of right temporal horn white matter punctate infarct, likely due to small vessel disease source related to HIV.  However, due to recent large lacunar infarct in August 2016, neurology is recommending 30 day cardiac monitoring as an outpatient to rule out cardioembolic source. - neurology following, appreciate recommendations - cardiology consulted for cardiac monitoring outpatient - TTE with EF of 50-55%, grade 1 diastolic dysfunction - Continue Asprin 81mg  daily - Continue Atorvastatin 40mg  daily - Allowing for permissive HTN - Carotid dopplers preliminary results without evidence of stenosis noted - PT, OT evaluations completed without further recs - Neuro checks - Monitor on telemetry  HIV:  Initially diagnosed in August 2016 found incidentally on CVA work-up.  Saw Dr. Luciana Axe in clinic in October 2016 who planned to initiate Tivicay and Descovy as well as TMP-SMX for opportunistic infection prophylaxis.  CD4/VL was 190/15,000 in September 2016.  However, patient did not start these meds and states he did not receive them due to cost.  - Will discuss with Dr. Luciana Axe about making sure he is able to access medications at discharge - Started Tivicay and Descovy yesterday - Continue TMP-SMX - Check CD4, VL - RPR negative  Acute Kidney Injury: baseline of 0.9 in early 2016, elevated to 2.1 in October and not lower than 1.2 since.  Potassium has been normal.  Creatine 1.5 on admission. - Creatine 1.3 this morning, potassium normal, continue to trend BMP  HTN - Hold home meds to allow for permissive HTN  Dispo: Disposition is deferred at this time, awaiting improvement of current medical problems.    The patient does have a current PCP (Darreld Mclean, MD) and does need an [facility] hospital follow-up appointment after discharge.  The patient does not have  transportation limitations that hinder transportation to clinic appointments.  .Services Needed at time of discharge: Y = Yes, Blank = No PT:   OT:   RN:   Equipment:   Other:     LOS: 1 day   Gwynn Burly, DO 09/08/2015, 10:28 AM

## 2015-09-08 NOTE — Progress Notes (Signed)
SLP Cancellation Note  Patient Details Name: Hazle NordmannWilliam Withem MRN: [id number] DOB: 04/03/1946   Cancelled treatment:       Reason Eval/Treat Not Completed: SLP screened, no needs identified, will sign off   Blenda MountsCouture, Suda Forbess Laurice 09/08/2015, 11:16 AM

## 2015-09-08 NOTE — Care Management Note (Signed)
Case Management Note  Patient Details  Name: Thomas [name] MRN: [id number] Date of Birth: 06-14-1946  Subjective/Objective:                    Action/Plan: CM received consult regarding patient medication needs.  CM met with patient, who states that his only concern is that he feels unsure about what medications he needs to take once he goes home.  CM assured patient that he will receive detailed instructions regarding his medications at discharge.  He also expressed concerns that he was out of his blood pressure medications.  CM explained that the physicians would help ensure that he was discharged home with all necessary scripts.  Patient was encouraged to ask questions when the discharge instructions are being provided.  CM updated bedside RN regarding patient's concerns.  Expected Discharge Date:                  Expected Discharge Plan:  Home/Self Care  In-House Referral:     Discharge planning Services  CM Consult  Post Acute Care Choice:    Choice offered to:     DME Arranged:    DME Agency:     HH Arranged:    HH Agency:     Status of Service:  In process, will continue to follow  Medicare Important Message Given:    Date Medicare IM Given:    Medicare IM give by:    Date Additional Medicare IM Given:    Additional Medicare Important Message give by:     If discussed at [place] of Stay Meetings, dates discussed:    Additional Comments:  Rolm Baptise, RN 09/08/2015, 11:56 AM [phone]

## 2015-09-08 NOTE — Discharge Summary (Signed)
Name: Thomas [name] MRN: [id number] DOB: 02-19-1946 70 y.o. PCP: Darreld Mclean, MD  Date of Admission: 09/06/2015  3:03 PM Date of Discharge: 09/08/2015 Attending Physician: No att. providers found  Discharge Diagnosis:  Principal Problem:   CVA (cerebral infarction) Active Problems:   Essential hypertension   HIV disease (HCC)   AKI (acute kidney injury) (HCC)   Pancytopenia (HCC)   HLD (hyperlipidemia)   Acute CVA (cerebrovascular accident) (HCC)  Discharge Medications:   Medication List    TAKE these medications        aspirin 81 MG EC tablet  Take 1 tablet (81 mg total) by mouth daily.     atorvastatin 40 MG tablet  Commonly known as:  LIPITOR  TAKE ONE TABLET BY MOUTH ONCE DAILY AT  6PM     dolutegravir 50 MG tablet  Commonly known as:  TIVICAY  Take 1 tablet (50 mg total) by mouth daily.     emtricitabine-tenofovir AF 200-25 MG tablet  Commonly known as:  DESCOVY  Take 1 tablet by mouth daily.     lisinopril-hydrochlorothiazide 20-12.5 MG tablet  Commonly known as:  PRINZIDE,ZESTORETIC  Take 2 tablets by mouth daily.     ondansetron 4 MG tablet  Commonly known as:  ZOFRAN  Take 1 tablet (4 mg total) by mouth every 8 (eight) hours as needed for nausea or vomiting.     sulfamethoxazole-trimethoprim 800-160 MG tablet  Commonly known as:  BACTRIM DS,SEPTRA DS  Take 1 tablet by mouth daily.        Disposition and follow-up:   [name] was discharged from [facility] in Stable condition.    1.  At the hospital follow up visit please address:  - if patient able to obtain HIV medications.  If not determine whether he has contacted [facility] - if patient able to obtain HTN medications  - any return of symptoms that prompted ED visit and hospitalization - that patient followed up with cardiology for external cardiac monitor  2.  Labs / imaging needed at time of follow-up: BMP  3.  Pending labs/ test needing follow-up: CD4, HIV viral  load  Follow-up Appointments: Follow-up Information    Follow up with [facility] On 09/12/2015.   Why:  [address] @ 10:30am to pick up your event monitor   Contact information:   [address] [place] [address] [phone]      Follow up with Xu,Jindong, MD On 11/07/2015.   Specialty:  Neurology   Why:  appt already scheduled. please keep at appointment time of 0800   Contact information:   [address] [place] [address] [phone]       Schedule an appointment as soon as possible for a visit with Staci Righter, MD.   Specialty:  Infectious Diseases   Contact information:   [address] [place] [address] [phone]       Follow up with Gwynn Burly, DO On 10/05/2015.   Specialty:  Internal Medicine   Why:  appointment time at 1:45pm   Contact information:   [address] [place] [address] [phone]       Discharge Instructions: Discharge Instructions    Diet - low sodium heart healthy    Complete by:  As directed      Increase activity slowly    Complete by:  As directed  Consultations:    Procedures Performed:  Dg Chest 2 View  09/07/2015  CLINICAL DATA:  Cough. EXAM: CHEST  2 VIEW COMPARISON:  04/20/2015 FINDINGS: The heart size and mediastinal contours are within normal limits. Both lungs are clear. The visualized skeletal structures are unremarkable. Mild scarring in the lung bases unchanged. IMPRESSION: No active cardiopulmonary disease. Electronically Signed   By: Marlan Palau M.D.   On: 09/07/2015 08:08   Ct Head Wo Contrast  09/06/2015  CLINICAL DATA:  Acute right handed numbness. EXAM: CT HEAD WITHOUT CONTRAST TECHNIQUE: Contiguous axial images were obtained from the base of the skull through the vertex without intravenous contrast. COMPARISON:  CT scan of April 20, 2015. FINDINGS: Bony calvarium appears intact. Mild  chronic ischemic white matter disease is noted. Old left periventricular white matter infarction is noted. Stable old infarction adjacent to right frontal horn. No mass effect or midline shift is noted. Ventricular size is within normal limits. There is no evidence of mass lesion, hemorrhage or acute infarction. IMPRESSION: Mild chronic ischemic white matter disease. Old left periventricular white matter infarction and old infarction adjacent to right frontal horn infarction. No acute intracranial abnormality seen. Electronically Signed   By: Lupita Raider, M.D.   On: 09/06/2015 13:59   Mr Brain Wo Contrast  09/06/2015  CLINICAL DATA:  Right arm weakness and numbness beginning at 6 o\'clock a.m. today. CVA 6 months ago. Persistent right-sided weakness. EXAM: MRI HEAD WITHOUT CONTRAST TECHNIQUE: Multiplanar, multiecho pulse sequences of the brain and surrounding structures were obtained without intravenous contrast. COMPARISON:  CT head without contrast from the same day. MRI brain 04/20/2015. FINDINGS: A punctate nonhemorrhagic periventricular acute infarct is present along the posterior right temporal and occipital lobe. No other acute infarct is present. The previously noted infarct of the left lentiform nucleus and corona radiata is again noted. There is a remote infarct of the anterior right coronal radiata. Moderate periventricular and subcortical white matter disease is otherwise stable. The ventricles are of normal size. White matter changes extend into the brainstem. 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 The internal auditory canals are within normal limits. Flow is present in the major intracranial arteries. Globes and orbits are intact. Mild mucosal thickening is present in the ethmoid air cells. A prominent polyp or mucous retention cyst is present in the inferior right maxillary sinus. There is mild mucosal  thickening along the inferior left maxillary sinus. The remaining paranasal sinuses and the mastoid air cells are clear. The skullbase is unremarkable. Multiple punctate foci of susceptibility are again noted within the thalami bilaterally as well as the posterior left lentiform nucleus. There are at least 2 punctate foci of remote hemorrhage in the left parietal occipital lobe and a single lesion in the left temporal lobe. A left paramedian pontine remote hemorrhage is noted. IMPRESSION: 1. Punctate nonhemorrhagic infarct along the posterior right temporal and occipital lobe adjacent to the ventricle. 2. Expected evolution of remote infarct involving the left lentiform nucleus and corona radiata. 3. Multiple foci of remote hemorrhage involving the thalami bilaterally as well as the left temporal and occipital lobe. Remote hemorrhage in the left paramedian pons. 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 Amyloid angiopathy is considered. 4. Mild stable sinus disease. Electronically Signed   By: Marin Roberts M.D.   On: 09/06/2015 19:24    2D Echo:  - Normal LV systolic function; grade 1  diastolic dysfunction; mild LVH; sclerotic aortic valve with mild to moderate MR; mild TR with mildly elevated pulmonary pressure.  Cardiac Cath: none  Admission HPI:  Mr. Beckstrand is a 70yo M with PMHx of HTN, h/o L lenticulostriate infarct in 04/2015, aortic ectasia and HIV diagnosed 04/2015 during CVA workup (last CD4 190, VL 15K in 9/16, only on TMP-SMX) who presents with R arm numbness and weakness distal from the elbow that started suddenly this morning at 7am while he was driving to work. He went to work and was then told to come to the ED. His weakness and numbness resolved roughly 3 hours ago, around 7-8pm. He did also have some R sided facial numbness on a neuro exam in the ED that he didn't previously notice that has since resolved. He denies any fevers, headache, vision changes, syncope,  chest pain, palpitations, shortness of breath, nausea, vomiting, abdominal pain, urinary or bowel symptoms, any other weakness, slurred speech, gait disturbance, or other issues throughout. He is compliant with all of his medications. He says has not noticed residual deficits from his previous stroke in August, but prior notes suggest subtle residual R arm weakness. At bedside, he is asymptomatic currently.  Hospital Course by problem list: Principal Problem:   CVA (cerebral infarction) Active Problems:   Essential hypertension   HIV disease (HCC)   AKI (acute kidney injury) (HCC)   Pancytopenia (HCC)   HLD (hyperlipidemia)   Acute CVA (cerebrovascular accident) (HCC)   CVA: Mr. Wehner had no acute infarct at left hemisphere to explain right-sided symptoms at admission but was currently back to baseline at discharge.  This was possibly a TIA but the incidental finding of right temporal horn white matter punctate infarct was likely due to small vessel disease source related to HIV. However, due to recent large lacunar infarct in August 2016, neurology recommended 30 day cardiac monitoring as an outpatient to rule out cardioembolic source.  We contacted cardiology who is arranged for cardiac event monitoring as an outpatient.  A TTE was performed that revealed an EF of 50-55%, grade 1 diastolic dysfunction and carotid dopplers revealed no significant stenosis.  We continued Asprin 81mg  dailly and  Atorvastatin 40mg  daily.  We allowed for permissive HTN.  PT and OT evaluated patient without any further recommendations needed.  HIV: Initially diagnosed in August 2016 found incidentally on CVA work-up. Saw Dr. Luciana Axe in clinic in October 2016 who planned to initiate Tivicay and Descovy as well as TMP-SMX for opportunistic infection prophylaxis. CD4/VL was 190/15,000 in September 2016. However, patient did not start these meds and states he did not receive them.  Unclear if this was due to cost or  patient not picking up his prescription.  I attempted to contact pharmacy where Mr. Rayford states he picked up his prescriptions but they had no prescription on file for his HIV meds.  We started his Tivicay and Descovy, reinforced importance of taking these medications everyday and sent refills to his pharmacy.  We continued his TMP-SMX for OI prophylaxis and rechecked CD4, VL.  Patient was also asked to contact [facility] if he was unable to acquire his HIV medications.  Acute Kidney Injury: baseline of 0.9 in early 2016, elevated to 2.1 in October and not lower than 1.2 since. Potassium has been normal. Creatine 1.5 on admission and returned to 1.3 at discharge.  Will follow as outpatient with repeat BMP.   Discharge Vitals:   BP 137/79 mmHg  Pulse 66  Temp(Src) 98.7 F (  37.1 C) (Oral)  Resp 18  Ht 5\' 9"  (1.753 m)  Wt 148 lb (67.132 kg)  BMI 21.85 kg/m2  SpO2 99%  Discharge Labs:  Results for orders placed or performed during the hospital encounter of 09/06/15 (from the past 24 hour(s))  Basic metabolic panel     Status: Abnormal   Collection Time: 09/08/15  3:01 AM  Result Value Ref Range   Sodium 138 135 - 145 mmol/L   Potassium 4.1 3.5 - 5.1 mmol/L   Chloride 106 101 - 111 mmol/L   CO2 25 22 - 32 mmol/L   Glucose, Bld 91 65 - 99 mg/dL   BUN 22 (H) 6 - 20 mg/dL   Creatinine, Ser 1.611.34 (H) 0.61 - 1.24 mg/dL   Calcium 8.6 (L) 8.9 - 10.3 mg/dL   GFR calc non Af Amer 52 (L) >60 mL/min   GFR calc Af Amer >60 >60 mL/min   Anion gap 7 5 - 15    Signed: Gwynn BurlyAndrew Ameliana Brashear, DO 09/08/2015, 4:46 PM    Services Ordered on Discharge: none Equipment Ordered on Discharge: none

## 2015-09-08 NOTE — Progress Notes (Signed)
STROKE TEAM PROGRESS NOTE   SUBJECTIVE (INTERVAL HISTORY) Patient sitting up in bed. No complaints. Plans to follow up with ID. Working with therapy stress ball.    OBJECTIVE Temp:  [97.8 F (36.6 C)-98.6 F (37 C)] 98.2 F (36.8 C) (01/06 0949) Pulse Rate:  [53-75] 75 (01/06 0949) Cardiac Rhythm:  [-] Normal sinus rhythm (01/06 0706) Resp:  [18-25] 18 (01/06 0949) BP: (122-164)/(78-96) 164/88 mmHg (01/06 0949) SpO2:  [95 %-100 %] 97 % (01/06 0949) Weight:  [67.132 kg (148 lb)] 67.132 kg (148 lb) (01/05 1730)  CBC:   Recent Labs Lab 09/06/15 1225 09/06/15 1233 09/07/15 0521  WBC 2.3*  --  1.9*  NEUTROABS 0.9*  --   --   HGB 11.0* 12.2* 11.5*  HCT 33.3* 36.0* 33.4*  MCV 84.5  --  82.5  PLT 86*  --  92*    Basic Metabolic Panel:   Recent Labs Lab 09/07/15 0521 09/08/15 0301  NA 140 138  K 4.6 4.1  CL 105 106  CO2 29 25  GLUCOSE 81 91  BUN 24* 22*  CREATININE 1.40* 1.34*  CALCIUM 8.9 8.6*    Lipid Panel:     Component Value Date/Time   CHOL 93 09/07/2015 0521   TRIG 25 09/07/2015 0521   HDL 30* 09/07/2015 0521   CHOLHDL 3.1 09/07/2015 0521   VLDL 5 09/07/2015 0521   LDLCALC 58 09/07/2015 0521   HgbA1c:  Lab Results  Component Value Date   HGBA1C 5.8* 09/07/2015   Urine Drug Screen:     Component Value Date/Time   LABOPIA NONE DETECTED 09/07/2015 0658   COCAINSCRNUR NONE DETECTED 09/07/2015 0658   LABBENZ NONE DETECTED 09/07/2015 0658   AMPHETMU NONE DETECTED 09/07/2015 0658   THCU NONE DETECTED 09/07/2015 0658   LABBARB NONE DETECTED 09/07/2015 0658      IMAGING I have personally reviewed the radiological images below and agree with the radiology interpretations.  Dg Chest 2 View 09/07/2015   No active cardiopulmonary disease.   Ct Head Wo Contrast 09/06/2015   Mild chronic ischemic white matter disease. Old left periventricular white matter infarction and old infarction adjacent to right frontal horn infarction. No acute intracranial  abnormality seen.   Mr Brain Wo Contrast 09/06/2015   1. Punctate nonhemorrhagic infarct along the posterior right temporal and occipital lobe adjacent to the ventricle. 2. Expected evolution of remote infarct involving the left lentiform nucleus and corona radiata. 3. Multiple foci of remote hemorrhage involving the thalami bilaterally as well as the left temporal and occipital lobe. Remote hemorrhage in the left paramedian pons. 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 Amyloid angiopathy is considered. 4. Mild stable sinus disease.   TTE  - Left ventricle: The cavity size was normal. Wall thickness wasincreased in a pattern of mild LVH. Systolic function was normal.The estimated ejection fraction was in the range of 50% to 55%.Wall motion was normal; there were no regional wall motionabnormalities. Doppler parameters are consistent with abnormalleft ventricular relaxation (grade 1 diastolic dysfunction). - Aortic valve: There was mild to moderate regurgitation. - Pulmonary arteries: Systolic pressure was mildly increased. PApeak pressure: 35 mm Hg (S). - Impressions:   Normal LV systolic function; grade 1 diastolic dysfunction; mildLVH; sclerotic aortic valve with mild to moderate MR; mild TRwith mildly elevated pulmonary pressure.  Carotid Duplex -  Mild intimal thickening without evidence of stenosis noted. Vertebral arteries - antegrade flow bilaterally.   Physical exam  Temp:  [97.8 F (36.6 C)-98.6  F (37 C)] 98.2 F (36.8 C) (01/06 0949) Pulse Rate:  [53-75] 75 (01/06 0949) Resp:  [18-25] 18 (01/06 0949) BP: (122-164)/(78-96) 164/88 mmHg (01/06 0949) SpO2:  [95 %-100 %] 97 % (01/06 0949) Weight:  [67.132 kg (148 lb)] 67.132 kg (148 lb) (01/05 1730)  General - Well nourished, well developed, in no apparent distress.  Ophthalmologic - Sharp disc margins OU.   Cardiovascular - Regular rate and rhythm with no murmur.  Mental Status -  Level of arousal and  orientation to time, place, and person were intact. Language including expression, naming, repetition, comprehension was assessed and found intact. Fund of Knowledge was assessed and was intact.  Cranial Nerves II - XII - II - Visual field intact OU. III, IV, VI - Extraocular movements intact. V - Facial sensation intact bilaterally. VII - right mild flattening of nasolabial fold. VIII - Hearing & vestibular intact bilaterally. X - Palate elevates symmetrically.Marland Kitchen. XI - Chin turning & shoulder shrug intact bilaterally XII - Tongue protrusion intact.  Motor Strength - The patient's strength was normal in all extremities and pronator drift was absent. Bulk was normal and fasciculations were absent.  Motor Tone - Muscle tone was assessed at the neck and appendages and was normal.  Reflexes - The patient's reflexes were 1+ in all extremities and he had no pathological reflexes.  Sensory - Light touch, temperature/pinprick were assessed and were symmetrical.   Coordination - The patient had normal movements in the hands with no ataxia or dysmetria. Tremor was absent.  Gait and Station - not tested due to safety concerns.   ASSESSMENT/PLAN Thomas [name] is a 70 y.o. 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 He did not receive IV t-PA due to symptoms resolved.   Recrudescence of old left BG/CR infarct 04/20/15  No acute infarct at left hemisphere this time to explain right sided symptoms  Pt currently back to baseline  Likely related to his worsening HIV due to noncompliance with HARRT with financial difficulty  WBC drop from 3.4 to 1.9  HARRT restarted and pt counseled on medication compliance.  Stroke:  Incidental finding, right temporal horn WM punctate infarct,  Likely due to small vessel disease source related to HIV. Due to recent relatively  large lacunar infarct in 04/2015, will recommend 30 day cardiac monitoring to rule out cardioembolic source.  MRI  Right temporal horn WM punctate infarct  Carotid Doppler  No source of embolus   2D Echo  EF 50-55%  LDL 58  HgbA1c 6.0 in Aug  for VTE prophylaxis - SCDs  30 day cardiac event monitoring as outpt to rule out afib has been set up Diet Heart Room service appropriate?: Yes; Fluid consistency:: Thin  aspirin 325 mg daily prior to admission, now on ASA 325mg  daily. Continue ASA on discharge.   Patient counseled to be compliant with his antithrombotic medications  Ongoing aggressive stroke risk factor management  Therapy recommendations:  OP OT  Disposition:  Anticipate return home  Stroke team will sign off  Already has follow up appt with Dr. Roda ShuttersXu on 11/07/2015  HIV  Off meds due to no refills  Will put back on HARRT  Social worker consult for financial support  WBC 3.4->1.9  UA neg  CXR no acute process  Viral load 119000  CD4 170   Hypertension  Stable  Permissive hypertension (OK if <  220/120) but gradually normalize in 5-7 days  Hyperlipidemia  Home meds:  lipitor 40,  resumed in hospital  LDL 58, goal < 70  Continue statin at discharge  Other Stroke Risk Factors  Advanced age  Hx stroke/TIA  04/2015 - left MCA lenticulostriate territory infarction with residual mild right arm weakness  Other Active Problems  Enlarged thyroid  Ectatic thoracic aorta  Hospital day # 1  Neurology will sign off. Please call with questions. Pt will follow up with Dr. Roda Shutters at [facility] on 11/07/15. Thanks for the consult.  Marvel Plan, MD PhD Stroke Neurology 09/08/2015 8:32 PM    To contact Stroke Continuity provider, please refer to [email or web address]. After hours, contact General Neurology

## 2015-09-08 NOTE — Progress Notes (Signed)
Patient is being d/chome. D/c instructions given and patient verbalized understanding. EMMI consent signed.

## 2015-09-08 NOTE — Progress Notes (Signed)
Occupational Therapy Treatment Patient Details Name: Thomas [name] MRN: [id number] DOB: 12/13/1945 Today's Date: 09/08/2015    History of present illness 70 y.o. male with a past medical history significant for HTN, HIV no taking HAART, left MCA lenticulostriate territory infarction 8/16 with residual mild right arm weakness, enlarged thyroid with multiple nodules. Pt presents with right arm weakness, tingling (resolved), stroke on MRI showed A punctate nonhemorrhagic periventricular acute infarct is present   OT comments  Treatment session with focus on progression of use of RUE with fine motor and gross motor activities.  Pt engaging in exercises with ball upon arrival, able to verbalize and demonstrate exercises given by previous OT.  Reiterated functional use during self-care tasks and hobbies in addition to written HEP.  Pt very motivated to get back to work, discussed fine and gross motor aspects of job and ways to simulate and progress towards those activities at home.  Will continue to follow acutely for RUE rehab.  Continue to recommend D/C home when medically stable.  Follow Up Recommendations  Other (comment) (See OT note from 1/5)    Equipment Recommendations  None recommended by OT    Recommendations for Other Services      Precautions / Restrictions Precautions Precautions: None              ADL Overall ADL's : Modified independent;At baseline                                                   Frequency Min 2X/week     Progress Toward Goals  OT Goals(current goals can now be found in the care plan section)  Progress towards OT goals: Progressing toward goals     Plan Discharge plan remains appropriate       End of Session     Activity Tolerance Patient tolerated treatment well   Patient Left in bed;with call bell/phone within reach     Time: [id number] OT Time Calculation (min): 13 min  Charges: OT General Charges $OT Visit:  1 Procedure OT Treatments $Therapeutic Activity: 8-22 mins  Thomas [name], Thomas [name], [phone] 09/08/2015, 1:53 PM

## 2015-09-08 NOTE — Progress Notes (Signed)
     I have set up a 30 day cardiac event monitor for 09/12/15 at 10:30am at our office for CVA. This has been placed in discharge follow up section of chart.   Cline CrockKathryn Joelly Bolanos PA-C  [facility]

## 2015-09-08 NOTE — Progress Notes (Signed)
Preliminary results by tech - Carotid Duplex Completed. Mild intimal thickening without evidence of stenosis noted. Vertebral arteries - antegrade flow bilaterally. Marilynne Halstedita Tahje Borawski, BS, RDMS, RVT

## 2015-09-08 NOTE — Discharge Instructions (Signed)
Dear [name],  [facility] for [facility].  This includes Tivicay and Descovy.  It is very important for you to get and start taking these medicines.  If you cannot pick them up, or they are too expensive, please call Dr. Luciana Axeomer with Infectious Disease right away and explain to their office that you are unable to get these medicines.  They will help you get access to them if you need it.  You should also make an appointment to follow up with Dr. Luciana Axeomer soon.  Please call their office to schedule an appointment.  Your other doctor's appointments are listed above.  Please make sure to go to them.  Take care, Dr. Earlene PlaterWallace

## 2015-09-11 ENCOUNTER — Other Ambulatory Visit: Payer: Self-pay | Admitting: Physician Assistant

## 2015-09-11 DIAGNOSIS — I4891 Unspecified atrial fibrillation: Secondary | ICD-10-CM

## 2015-09-11 DIAGNOSIS — I639 Cerebral infarction, unspecified: Secondary | ICD-10-CM

## 2015-09-11 DIAGNOSIS — I6349 Cerebral infarction due to embolism of other cerebral artery: Secondary | ICD-10-CM

## 2015-09-12 ENCOUNTER — Ambulatory Visit (INDEPENDENT_AMBULATORY_CARE_PROVIDER_SITE_OTHER): Payer: Managed Care, Other (non HMO)

## 2015-09-12 DIAGNOSIS — I4891 Unspecified atrial fibrillation: Secondary | ICD-10-CM | POA: Diagnosis not present

## 2015-09-12 DIAGNOSIS — I6349 Cerebral infarction due to embolism of other cerebral artery: Secondary | ICD-10-CM | POA: Diagnosis not present

## 2015-09-12 DIAGNOSIS — I639 Cerebral infarction, unspecified: Secondary | ICD-10-CM

## 2015-09-15 ENCOUNTER — Encounter: Payer: Self-pay | Admitting: *Deleted

## 2015-10-05 ENCOUNTER — Encounter: Payer: Self-pay | Admitting: Internal Medicine

## 2015-10-05 ENCOUNTER — Ambulatory Visit (INDEPENDENT_AMBULATORY_CARE_PROVIDER_SITE_OTHER): Payer: Managed Care, Other (non HMO) | Admitting: Internal Medicine

## 2015-10-05 DIAGNOSIS — Z8673 Personal history of transient ischemic attack (TIA), and cerebral infarction without residual deficits: Secondary | ICD-10-CM | POA: Diagnosis not present

## 2015-10-05 DIAGNOSIS — Z21 Asymptomatic human immunodeficiency virus [HIV] infection status: Secondary | ICD-10-CM | POA: Diagnosis not present

## 2015-10-05 DIAGNOSIS — Z79899 Other long term (current) drug therapy: Secondary | ICD-10-CM

## 2015-10-05 DIAGNOSIS — I63312 Cerebral infarction due to thrombosis of left middle cerebral artery: Secondary | ICD-10-CM

## 2015-10-05 DIAGNOSIS — I10 Essential (primary) hypertension: Secondary | ICD-10-CM | POA: Diagnosis not present

## 2015-10-05 DIAGNOSIS — B20 Human immunodeficiency virus [HIV] disease: Secondary | ICD-10-CM

## 2015-10-05 NOTE — Assessment & Plan Note (Signed)
Assessment: 70 year old man with a recent CVA was admitted last month for new neurologic deficit on the right arm. This deficit improved without intervention within a few hours of hospital admission. MRI of the brain showed expected changes following his most recent CVA in September. Likely this was a TIA event. We consulted neurology, no change to antiplatelet regimen this time. Rather we arranged for him to have a 30 day external cardiac monitor to look for atrial fibrillation.  Patient reports compliance with aspirin, atorvastatin, and BP medications.  He is still having some minor residual right sided weakness of his upper extremity which he reports makes it difficult to return to work.  Getting back to work is something that is important to him.  He is still wearing 30-day Holter monitor.  Plan: - continue current management - folllow up with cardiology for Holter monitor - OT referral to help with right arm weakness

## 2015-10-05 NOTE — Assessment & Plan Note (Signed)
Assessment: Patient blood pressure today in clinic is 149/85 which is elevated above his goal.  He reports taking lisinopril-HCTZ 20-12.5mg  2 tablets daily and states he took his medication today.  He did not bring a BP log today, but states his BP is usually 120-130s systolic at home. His blood pressure has been elevated at previous clinic visits as well.  Plan: - important to get blood pressure under control to reduce risk for recurrent CVA - asked to bring home BP log to future visits - consider 3rd agent if BP not controlled at follow up

## 2015-10-05 NOTE — Assessment & Plan Note (Addendum)
Assessment: Patient has been seen by Dr. Luciana Axe of [facility] back in October 2016.  Was planning to start on Tivicay and Descovy once approval by drug assistance program went through.  When we admitted patient for likely TIA in early January 2017, patient had not received HIV medications.  At discharge we, sent in prescriptions for Tivicay and Descovy to patients pharmacy as he has health insurance now.  We also continued him on Bactrim for opportunistic infection prophylaxis as his repeat CD4 and VL were 170 and 119,000 respectively.  Today, he presents to [facility] with his prescription bottles of Tivicay and Descovy, reports taking them daily as prescribed.  He also reports taking Bactrim daily.  Has not yet followed up with [facility].  Plan: - continue HAART therapy with Tivicay and Descovy - continue Bactrim - follow up with [facility] - check viral load and genotype for resistance.  Patient unlikely to have acquired resistance as he had not started therapy until January and reports being compliant since that time.

## 2015-10-05 NOTE — Patient Instructions (Signed)
Thank you for coming to see me today. It was a pleasure. Today we talked about:   1.  I sent a referral for you to get occupational therapy for your right arm weakness.  They will call you with an appointment time. 2.  Please follow up with the Infect[facility] Disease Clinic (Dr. Luciana Axe).  I will send them a message to get you scheduled for an appointment. 3.  Keep taking your other medications and go to your follow up appointments.  Please follow-up with your primary doctor in 6 months.  If you have any questions or concerns, please do not hesitate to call the office at [phone].  Take Care,   Gwynn Burly, DO

## 2015-10-05 NOTE — Progress Notes (Signed)
Patient ID: Thomas [name], male   DOB: 18-Jan-1946, 70 y.o.   MRN: [id number]   Subjective:   Patient ID: Thomas [name] male   DOB: 07-11-1946 70 y.o.   MRN: [id number]  HPI: [name] is a 70 y.o. male with past medical history as detailed below who presents today for hospital follow up for CVA, HIV, and HTN.  Please see A&P for status of patients medical conditions addressed today.    Past Medical History  Diagnosis Date  . HTN (hypertension)   . Enlarged thyroid 04/22/2015    Seen on CTA 04/21/15: Contains multiple nodules measuring up to 2.1 cm in size, TSH wnl.  Needs thyroid ultrasound and biopsy  . 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  Recommend 1 year follow up with CT or MRI.   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 Immune deficiency disorder (HCC)   . HIV (human immunodeficiency virus infection) (HCC)   . Heart murmur   . Stroke [facility])    Current Outpatient Prescriptions  Medication Sig Dispense Refill  . aspirin 81 MG EC tablet Take 1 tablet (81 mg total) by mouth daily. 30 tablet 5  . atorvastatin (LIPITOR) 40 MG tablet TAKE ONE TABLET BY MOUTH ONCE DAILY AT  6PM 30 tablet 11  . dolutegravir (TIVICAY) 50 MG tablet Take 1 tablet (50 mg total) by mouth daily. 30 tablet 5  . emtricitabine-tenofovir AF (DESCOVY) 200-25 MG tablet Take 1 tablet by mouth daily. 30 tablet 5  . lisinopril-hydrochlorothiazide (PRINZIDE,ZESTORETIC) 20-12.5 MG tablet Take 2 tablets by mouth daily. 60 tablet 5  . ondansetron (ZOFRAN) 4 MG tablet Take 1 tablet (4 mg total) by mouth every 8 (eight) hours as needed for nausea or vomiting. 11 tablet 0  . sulfamethoxazole-trimethoprim (BACTRIM DS,SEPTRA DS) 800-160 MG tablet Take 1 tablet by mouth daily. 30 tablet 5   No current facility-administered medications for this visit.   Family History  Problem Relation Age of Onset  . Hypertension Mother   . Hyperlipidemia Mother     Social History   Social History  . Marital Status: Single    Spouse Name: N/A  . Number of Children: N/A  . Years of Education: N/A   Social History Main Topics  . Smoking status: Never Smoker   . Smokeless tobacco: Never Used  . Alcohol Use: No  . Drug Use: No  . Sexual Activity: Not Asked   Other Topics Concern  . None   Social History Narrative   Review of Systems: Review of Systems  Constitutional: Negative for fever and chills.  Eyes: Negative for blurred vision.  Respiratory: Negative for cough.   Cardiovascular: Negative for chest pain and leg swelling.  Gastrointestinal: Negative for nausea, vomiting and abdominal pain.  Musculoskeletal: Negative for falls.  Neurological: Positive for focal weakness. Negative for sensory change, speech change, seizures and headaches.    Objective:  Physical Exam: Filed Vitals:   10/05/15 1344  BP: 149/85  Pulse: 61  Temp: 98.1 F (36.7 C)  TempSrc: Oral  Resp: 18  Height:  (1.753 m)  Weight: 138 lb 6.4 oz (62.778 kg)  SpO2: 100%   Physical Exam  Constitutional: He is oriented to person, place, and time and well-developed, well-nourished, and in no distress.  HENT:  Head: Normocephalic and atraumatic.  Eyes: EOM are normal.  Neck: Normal range of motion.  Cardiovascular: Normal rate, regular rhythm and  normal heart sounds.   Pulmonary/Chest: Effort normal.  Abdominal: Soft. He exhibits no distension. There is no tenderness.  Neurological: He is alert and oriented to person, place, and time.  Upper extremity strength 4/5 on right, 5/5 on left. Lower extremity strength 5/5 bilaterally. Sensation to light touch symmetric. CN II-XII intact with no focal deficits.     Assessment & Plan:   Please see problem list for assessment and plan.  Case discussed with Dr. Heide Spark.

## 2015-10-06 LAB — BMP8+ANION GAP
Anion Gap: 16 mmol/L (ref 10.0–18.0)
BUN / CREAT RATIO: 13 (ref 10–22)
BUN: 19 mg/dL (ref 8–27)
CHLORIDE: 100 mmol/L (ref 96–106)
CO2: 25 mmol/L (ref 18–29)
Calcium: 9.1 mg/dL (ref 8.6–10.2)
Creatinine, Ser: 1.45 mg/dL — ABNORMAL HIGH (ref 0.76–1.27)
GFR calc non Af Amer: 48 mL/min/{1.73_m2} — ABNORMAL LOW (ref >59)
GFR, EST AFRICAN AMERICAN: 56 mL/min/{1.73_m2} — AB (ref >59)
GLUCOSE: 91 mg/dL (ref 65–99)
POTASSIUM: 4.6 mmol/L (ref 3.5–5.2)
Sodium: 141 mmol/L (ref 134–144)

## 2015-10-06 NOTE — Addendum Note (Signed)
Addended by: Kathlynn Grate on: 10/06/2015 05:49 PM   Modules accepted: Orders

## 2015-10-09 ENCOUNTER — Telehealth: Payer: Self-pay

## 2015-10-09 NOTE — Telephone Encounter (Signed)
Attempted to reach patient for follow up visit with Dr.Comer  His phone is set up not receive voicemail. If patient returns call please make him aware of ASAP follow up with Dr Luciana Axe.  He can be worked into his schedule.  I will continue to call with hopes he may answer his phone.   Laurell Josephs, RN

## 2015-10-10 ENCOUNTER — Ambulatory Visit
Admission: RE | Admit: 2015-10-10 | Discharge: 2015-10-10 | Disposition: A | Discharge status: Home / Self Care | Payer: Medicare Other | Source: Ambulatory Visit | Attending: Internal Medicine | Admitting: Internal Medicine

## 2015-10-10 ENCOUNTER — Telehealth: Payer: Self-pay | Admitting: Internal Medicine

## 2015-10-10 ENCOUNTER — Other Ambulatory Visit (HOSPITAL_COMMUNITY)
Admission: RE | Admit: 2015-10-10 | Discharge: 2015-10-10 | Disposition: A | Discharge status: Home / Self Care | Payer: Medicare Other | Source: Ambulatory Visit | Attending: Physician Assistant | Admitting: Physician Assistant

## 2015-10-10 DIAGNOSIS — E049 Nontoxic goiter, unspecified: Secondary | ICD-10-CM

## 2015-10-10 DIAGNOSIS — E041 Nontoxic single thyroid nodule: Secondary | ICD-10-CM | POA: Diagnosis not present

## 2015-10-10 NOTE — Telephone Encounter (Signed)
I was contacted by [facility] regarding patient.  He is there today for visit . I was able to speak with him and schedule office visit with Dr Luciana Axe.  It appears he has forms from his employer that need completion by internal medicine.    I have asked for the form to be faxed to our office just in case it does not get completed. I am not sure Dr Luciana Axe can complete the form but at least we can review it.   Patient still seems to be very weak and having issues understanding the process.  I have review his medical records to see if he has been taking his anti-retrovirals.  He may be in need of assistance as far as following up with ID and maybe medication refills.   It seems he has insurance in place that will cover HIV medications we will need to make sure he is taking the medication and has refills.   Laurell Josephs, RN

## 2015-10-10 NOTE — Procedures (Signed)
Using direct ultrasound guidance, 4 passes were made using needles into the nodule within the right lobe of the thyroid.   Ultrasound was used to confirm needle placements on all occasions.   Specimens were sent to Pathology for analysis.   Porschia Willbanks S Luree Palla PA-C 06/27/2015 1:43 PM   

## 2015-10-10 NOTE — Telephone Encounter (Signed)
Patient walked in requesting his [employer] paper to be completed ASAP per his Employer. Forms were sent on 09/22/2015.  Patient LOV was on 10/05/2015 by Dr. Earlene Plater. Please advise as paperwork has not been completed and patient can not wait 2-4 weeks to be seen by his PCP.  Ilda Basset Rn to f/u with Dr. Earlene Plater.   Also, patient Due to see ID on 10/12/2015 with Dr. Luciana Axe and to F/u with Neurology in March.

## 2015-10-10 NOTE — Progress Notes (Signed)
[facility] Attending  Case discussed with Dr. Wallace soon after the resident saw the patient.  We reviewed the resident's history and exam and pertinent patient test results.  I agree with the assessment, diagnosis, and plan of care documented in the resident's note. 

## 2015-10-10 NOTE — Telephone Encounter (Signed)
This has been completed by Dr. Earlene Plater and Dr. Allena Katz is aware, closing encounter.

## 2015-10-12 ENCOUNTER — Encounter: Payer: Self-pay | Admitting: Internal Medicine

## 2015-10-12 ENCOUNTER — Ambulatory Visit (INDEPENDENT_AMBULATORY_CARE_PROVIDER_SITE_OTHER): Payer: Managed Care, Other (non HMO) | Admitting: Internal Medicine

## 2015-10-12 VITALS — BP 147/84 | HR 69 | Wt 143.0 lb

## 2015-10-12 DIAGNOSIS — I639 Cerebral infarction, unspecified: Secondary | ICD-10-CM

## 2015-10-12 DIAGNOSIS — E785 Hyperlipidemia, unspecified: Secondary | ICD-10-CM

## 2015-10-12 DIAGNOSIS — I10 Essential (primary) hypertension: Secondary | ICD-10-CM | POA: Diagnosis not present

## 2015-10-12 DIAGNOSIS — B20 Human immunodeficiency virus [HIV] disease: Secondary | ICD-10-CM | POA: Diagnosis not present

## 2015-10-14 NOTE — Progress Notes (Signed)
CC: Follow up for HIV  Interval history: Currently is asymptomatic and recently started Tivicay and Descovy via ADAP.   Has no associated fatigue, no diarrhea, no weight loss.  He started about 1 month ago and denies missed doses.   He had his labs checked by his PCP at IM clinic and his viral load is down to under 1000 copies (not available in Epic).   Also has hyperlipidemia and is controlled with atorvastatin, high dose and hypertension on lisinopril and HCTZ.    Prior to Admission medications   Medication Sig Start Date End Date Taking? Authorizing Provider  aspirin 81 MG EC tablet Take 1 tablet (81 mg total) by mouth daily. 09/08/15  Yes Gwynn Burly, DO  atorvastatin (LIPITOR) 40 MG tablet TAKE ONE TABLET BY MOUTH ONCE DAILY AT  6PM 09/08/15  Yes Gwynn Burly, DO  dolutegravir (TIVICAY) 50 MG tablet Take 1 tablet (50 mg total) by mouth daily. 09/08/15  Yes Gwynn Burly, DO  emtricitabine-tenofovir AF (DESCOVY) 200-25 MG tablet Take 1 tablet by mouth daily. 09/08/15  Yes Gwynn Burly, DO  lisinopril-hydrochlorothiazide (PRINZIDE,ZESTORETIC) 20-12.5 MG tablet Take 2 tablets by mouth daily. 09/08/15  Yes Gwynn Burly, DO  ondansetron (ZOFRAN) 4 MG tablet Take 1 tablet (4 mg total) by mouth every 8 (eight) hours as needed for nausea or vomiting. 06/22/15  Yes Courteney Lyn Mackuen, MD  sulfamethoxazole-trimethoprim (BACTRIM DS,SEPTRA DS) 800-160 MG tablet Take 1 tablet by mouth daily. 09/08/15  Yes Gwynn Burly, DO    Review of Systems Constitutional: negative for fatigue and malaise Musculoskeletal: negative for myalgias and arthralgias All other systems reviewed and are negative   Physical Exam: CONSTITUTIONAL:in no apparent distress and alert  Filed Vitals:   10/12/15 1448  BP: 147/84  Pulse: 69   Eyes: anicteric HENT: no thrush, no cervical lymphadenopathy Respiratory: Normal respiratory effort; CTA B Cardiovascular: RRR  Lab Results  Component Value Date   HIV1RNAQUANT  119000 09/07/2015   Paper lab reviewed as above

## 2015-10-14 NOTE — Assessment & Plan Note (Signed)
No issues.  On high dose with history of CVA

## 2015-10-14 NOTE — Assessment & Plan Note (Signed)
Stable, no indication for change in therapy

## 2015-10-14 NOTE — Assessment & Plan Note (Addendum)
Improving on treatment with decrease.  RTC 2-3 months for repeat lab.  Continue Bactrim for OI prophylaxis

## 2015-10-16 ENCOUNTER — Encounter: Payer: Self-pay | Admitting: Occupational Therapy

## 2015-10-16 ENCOUNTER — Ambulatory Visit: Payer: Managed Care, Other (non HMO) | Attending: Internal Medicine | Admitting: Occupational Therapy

## 2015-10-16 DIAGNOSIS — G8191 Hemiplegia, unspecified affecting right dominant side: Secondary | ICD-10-CM

## 2015-10-16 DIAGNOSIS — I6980 Unspecified sequelae of other cerebrovascular disease: Secondary | ICD-10-CM | POA: Insufficient documentation

## 2015-10-16 DIAGNOSIS — R279 Unspecified lack of coordination: Secondary | ICD-10-CM | POA: Diagnosis present

## 2015-10-16 DIAGNOSIS — M6249 Contracture of muscle, multiple sites: Secondary | ICD-10-CM | POA: Diagnosis present

## 2015-10-16 DIAGNOSIS — M6289 Other specified disorders of muscle: Secondary | ICD-10-CM

## 2015-10-16 DIAGNOSIS — IMO0002 Reserved for concepts with insufficient information to code with codable children: Secondary | ICD-10-CM

## 2015-10-16 DIAGNOSIS — M6281 Muscle weakness (generalized): Secondary | ICD-10-CM | POA: Diagnosis present

## 2015-10-16 NOTE — Therapy (Signed)
[facility] [address] [place], [place], [address] Phone: [phone]   Fax:  [phone]  Occupational Therapy Evaluation  Patient Details  Name: Thomas [name] MRN: [id number] Date of Birth: February 07, 1946 Referring Provider: Dr. Gwynn Burly  Encounter Date: 10/16/2015      OT End of Session - 10/16/15 1759    Visit Number 1   Number of Visits 16   Date for OT Re-Evaluation 12/11/15   Authorization Type medicare - will need G code and PN every 10th visit   Authorization Time Period 60 days   Authorization - Visit Number 1   Authorization - Number of Visits 10   OT Start Time 1532   OT Stop Time 1610   OT Time Calculation (min) 38 min   Activity Tolerance Patient tolerated treatment well      Past Medical History  Diagnosis Date  . HTN (hypertension)   . Enlarged thyroid 04/22/2015    Seen on CTA 04/21/15: Contains multiple nodules measuring up to 2.1 cm in size, TSH wnl.  Needs thyroid ultrasound and biopsy  . 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  Recommend 1 year follow up with CT or MRI.   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 Immune deficiency disorder (HCC)   . HIV (human immunodeficiency virus infection) (HCC)   . Heart murmur   . Stroke [facility])     Past Surgical History  Procedure Laterality Date  . No past surgeries      There were no vitals filed for this visit.  Visit Diagnosis:  Hemiplegia affecting right dominant side (HCC)  Muscle tone increased  Muscle weakness  Lack of coordination due to stroke      Subjective Assessment - 10/16/15 1540    Subjective  I have no pain jus weakness   Pertinent History see epic   Patient Stated Goals to get my arm and hand working good enough to go back to work   Currently in Pain? No/denies           [facility] OT Assessment - 10/16/15 0001    Assessment   Diagnosis L CVA    Referring Provider Dr. Gwynn Burly   Onset Date 09/06/15  pt had previous stroke on 04/22/2015    Prior Therapy OT, PT in acute care. No other therapies   Precautions   Precautions None   Restrictions   Weight Bearing Restrictions No   Balance Screen   Has the patient fallen in the past 6 months No   Home  Environment   Family/patient expects to be discharged to: Private residence   Living Arrangements Alone   Available Help at Discharge --  none   Type of Home House   Home Layout One level   Bathroom Shower/Tub Tub/Shower unit   Additional Comments Pt states he has no difficulty accessing toilet or shower and has no equipment.   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  may be able to alter work requirements to some degree   ADL   Eating/Feeding Modified independent  uses L hand   Grooming Modified independent  mainly uses L hand (R hand as occassional assist   Upper Body Bathing Independent   Lower Body  Bathing Independent   Upper Body Dressing Increased time   Lower Body Dressing Increased time   Toilet Tranfer Independent   Toileting - Conservator, museum/gallery -  Tour manager Independent   IADL   Shopping Takes care of all shopping needs independently   Light Housekeeping Maintains house alone or with occasional assistance   Meal Prep Plans, prepares and serves adequate meals independently   Medication Management Is responsible for taking medication in correct dosages at correct time   Financial Management Manages financial matters independently (budgets, writes checks, pays rent, bills goes to bank), collects and keeps track of income   Mobility   Mobility Status Independent   Written Expression   Dominant Hand Right   Handwriting Not legible   for name   Vision - History   Baseline Vision Wears glasses for distance only   Vision Assessment   Comment Pt denies any visual changes from stroke   Activity Tolerance   Activity Tolerance Endurance does not limit participation in activity   Cognition   Overall Cognitive Status Within Functional Limits for tasks assessed  states memory intitally but now feels he is at baseline   Sensation   Light Touch Appears Intact   Hot/Cold Appears Intact   Proprioception Appears Intact   Coordination   Gross Motor Movements are Fluid and Coordinated No   Fine Motor Movements are Fluid and Coordinated No   Finger Nose Finger Test impaired and slow compared to LUE   9 Hole Peg Test Right;Left   Right 9 Hole Peg Test 1.28.69   Left 9 Hole Peg Test 23.79   Box and Blocks 27   Tone   Assessment Location Right Upper Extremity   ROM / Strength   AROM / PROM / Strength AROM;Strength   AROM   Overall AROM  Deficits   Overall AROM Comments Pt with mild deficits for shoulder flexion (165* then difficulty to obtain higher with straight elbow), half range for supination, 70% of wrist extension   Strength   Overall Strength Deficits   Overall Strength Comments Pt with 3+/5 for shoulder flexion and horizontal abduction and 4+/5 for elbow flexion and extension.   Hand Function   Right Hand Gross Grasp Impaired   Right Hand Grip (lbs) 30   Left Hand Gross Grasp Impaired   Left Hand Grip (lbs) 90   RUE Tone   RUE Tone Mild  flexor influence with overhead reach                           OT Short Term Goals - 10/16/15 1747    OT SHORT TERM GOAL #1   Title Pt will be mod I with HEP - 11/13/2015   Status New   OT SHORT TERM GOAL #2   Title Pt will improve grip strength by at least 5 pounds in RUE (baseline = 30)   Status New   OT SHORT TERM GOAL #3   Title Pt will demonstrate improved coordination as evidenced by decreasing time on 9 hole peg by 20 seconds (baseline= 1.28.69    Status New   OT SHORT TERM GOAL #4   Title Pt will demonstrate ability for unilateral overhead reach with RUE to pick up 5 pound object x5 without dropping.   Status New   OT SHORT TERM GOAL #5   Title Pt will demostrate ability to write name legibly with AE  prn   Status New   Additional Short Term Goals   Additional Short Term Goals Yes   OT SHORT TERM GOAL #6   Title Pt will be able to use use R hand for self feeding for at least 25% of meal.   Status New           OT Long Term Goals - Nov 01, 2015 1751    OT LONG TERM GOAL #1   Title Pt will be mod I with upgraded HEP - 12/11/2015   Status New   OT LONG TERM GOAL #2   Title Pt will improve grip strength by at least 8 pounds (baseline= 30 pounds)   Status New   OT LONG TERM GOAL #3   Title Pt will demostrate improved coordination as evidenced by decreasing time on 9 hole peg by 30 seconds (baseline=1.28.69)   Status New   OT LONG TERM GOAL #4   Title Pt will improve on box and blocks by at least 8 blocks (baseline=27)   Status New   OT LONG TERM GOAL #5   Title Pt will be able to lift 20 pounds using both hands   Status New   OT LONG TERM GOAL #6   Title Pt will be able to write one sentence legibly with AE prn   Status New   OT LONG TERM GOAL #7   Title Pt will be able to eat at least 50% of meal with R hand   Status New   OT LONG TERM GOAL #8   Title Pt will be able to simulate using R hand for placing objects in box/labeling as prep for return to work using both hands   Status New               Plan - 11/01/2015 1755    Clinical Impression Statement Pt is a 70 year old male with PMH of HTN, HLD, ectatic thoracic aorta, adjustment disorder, CVA 04/2015, HIV with new L CVA on 09/06/2015. Pt presents with the following impairments that impact his ability to use his R dominant hand in basic self care, IADL's, lesiure and return to work:  R dominant hemiplegia, decreased grip strength, decreased proximal strength, impaired  tone, decresaed coordination, decreased functional use of R hand. Pt will benefit from skilled OT to address these deficits to maximize use of R dominant hand and hopefully return to work.    Pt will benefit from skilled therapeutic intervention in order to improve on the following deficits (Retired) Decreased coordination;Decreased strength;Impaired UE functional use;Impaired tone   OT Frequency 2x / week   OT Duration 8 weeks   OT Treatment/Interventions Self-care/ADL training;Electrical Stimulation;Therapeutic exercise;Neuromuscular education;DME and/or AE instruction;Manual Therapy;Therapeutic activities;Patient/family education   Plan initiate HEP   Consulted and Agree with Plan of Care Patient          G-Codes - 11/01/2015 1802    Functional Assessment Tool Used 9 hole peg, dynamometer, box and blocks   Functional Limitation Carrying, moving and handling objects   Carrying, Moving and Handling Objects Current Status [phone]) At least 80 percent but less than 100 percent impaired, limited or restricted   Carrying, Moving and Handling Objects Goal Status ([id number]) At least 40 percent but less than 60 percent impaired, limited or restricted      Problem List Patient Active Problem List   Diagnosis Date Noted  . Acute CVA (cerebrovascular accident) (HCC) 09/07/2015  . HLD (hyperlipidemia)   . Pancytopenia (HCC) 09/06/2015  .  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 AKI (acute kidney injury) (HCC) 06/26/2015  . Adjustment disorder 06/26/2015  . HIV disease (HCC) 04/24/2015  . Enlarged thyroid 04/22/2015  . 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 CVA (cerebral infarction) 04/20/2015  . Essential hypertension 04/20/2015  . Hypertensive urgency 10/15/2014    Norton Pastel, OTR/L 10/16/2015, 6:05 PM  [facility] [address] [place], [place],  [address] Phone: [phone]   Fax:  [phone]  Name: Thomas [name] MRN: [id number] Date of Birth: 08/03/1946

## 2015-10-17 ENCOUNTER — Telehealth: Payer: Self-pay

## 2015-10-19 ENCOUNTER — Encounter: Payer: Self-pay | Admitting: Occupational Therapy

## 2015-10-19 ENCOUNTER — Ambulatory Visit: Payer: Managed Care, Other (non HMO) | Admitting: Occupational Therapy

## 2015-10-19 DIAGNOSIS — M6281 Muscle weakness (generalized): Secondary | ICD-10-CM

## 2015-10-19 DIAGNOSIS — M6289 Other specified disorders of muscle: Secondary | ICD-10-CM

## 2015-10-19 DIAGNOSIS — IMO0002 Reserved for concepts with insufficient information to code with codable children: Secondary | ICD-10-CM

## 2015-10-19 DIAGNOSIS — G8191 Hemiplegia, unspecified affecting right dominant side: Secondary | ICD-10-CM

## 2015-10-19 NOTE — Patient Instructions (Signed)
Theraputty exercises:  Using red color putty:  Do these 1-2 times per day. STOP if you get pain and let your therapist know.  1. Make a ball     Make a pancake     Make a cone Do this sequence 3 times  2. Make a fat hot dog. Squeeze as hard as you can. Do this 10 times 3. Make a snake:  2 pt pinch  3 pt pinch  Lateral pinch  Do these 2 times each.

## 2015-10-19 NOTE — Therapy (Signed)
[facility] Health Outpt Rehabilitation [facility] [address] [place], [place], [address] Phone: [phone]   Fax:  [phone]  Occupational Therapy Treatment  Patient Details  Name: Thomas [name] MRN: [id number] Date of Birth: 02-07-1946 Referring Provider: Dr. Gwynn Burly  Encounter Date: 10/19/2015      OT End of Session - 10/19/15 0928    Visit Number 2   Number of Visits 16   Date for OT Re-Evaluation 12/11/15   Authorization Type medicare - will need G code and PN every 10th visit   Authorization Time Period 60 days   Authorization - Visit Number 2   Authorization - Number of Visits 10   OT Start Time [phone]   OT Stop Time 0930   OT Time Calculation (min) 44 min   Activity Tolerance Patient tolerated treatment well      Past Medical History  Diagnosis Date  . HTN (hypertension)   . Enlarged thyroid 04/22/2015    Seen on CTA 04/21/15: Contains multiple nodules measuring up to 2.1 cm in size, TSH wnl.  Needs thyroid ultrasound and biopsy  . 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  Recommend 1 year follow up with CT or MRI.   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 Immune deficiency disorder (HCC)   . HIV (human immunodeficiency virus infection) (HCC)   . Heart murmur   . Stroke [facility])     Past Surgical History  Procedure Laterality Date  . No past surgeries      There were no vitals filed for this visit.  Visit Diagnosis:  Hemiplegia affecting right dominant side (HCC)  Muscle tone increased  Muscle weakness  Lack of coordination due to stroke      Subjective Assessment - 10/19/15 0850    Subjective  I am ready to start working on my hand!   Pertinent History see epic   Patient Stated Goals to get my arm and hand working good enough to go back to work   Currently in Pain? No/denies                      OT Treatments/Exercises (OP) - 10/19/15  0001    Exercises   Exercises Hand   Hand Exercises   Theraputty Flatten;Roll;Grip;Pinch  red   Theraputty - Locate Pegs Locate 20 pegs in green putty and place in peg board. Pt able to do with minimal diffuculty and increaed time.    Other Hand Exercises Reviewed all goals with pt and pt in agreement. Pt issued HEP with red theraputty - see pt instructions for details Pt able to return demonstrate and given handout with pictues after practice and repetition. Will need to check next session to ensure understanding.                 OT Education - 10/19/15 [phone]    Education provided Yes   Education Details theraputty program with red putty   Person(s) Educated Patient   Methods Explanation;Demonstration;Verbal cues;Handout   Comprehension Verbalized understanding;Returned demonstration  after repettion and increaed time - will need to check next session for carry over          OT Short Term Goals - 10/19/15 0925    OT SHORT TERM GOAL #1   Title Pt will be mod I with HEP - 11/13/2015   Status On-going   OT SHORT TERM GOAL #2  Title Pt will improve grip strength by at least 5 pounds in RUE (baseline = 30)   Status On-going   OT SHORT TERM GOAL #3   Title Pt will demonstrate improved coordination as evidenced by decreasing time on 9 hole peg by 20 seconds (baseline= 1.28.69   Status On-going   OT SHORT TERM GOAL #4   Title Pt will demonstrate ability for unilateral overhead reach with RUE to pick up 5 pound object x5 without dropping.   Status On-going   OT SHORT TERM GOAL #5   Title Pt will demostrate ability to write name legibly with AE prn   Status On-going   OT SHORT TERM GOAL #6   Title Pt will be able to use use R hand for self feeding for at least 25% of meal.   Status On-going           OT Long Term Goals - 10/19/15 0925    OT LONG TERM GOAL #1   Title Pt will be mod I with upgraded HEP - 12/11/2015   Status On-going   OT LONG TERM GOAL #2   Title Pt  will improve grip strength by at least 8 pounds (baseline= 30 pounds)   Status On-going   OT LONG TERM GOAL #3   Title Pt will demostrate improved coordination as evidenced by decreasing time on 9 hole peg by 30 seconds (baseline=1.28.69)   Status On-going   OT LONG TERM GOAL #4   Title Pt will improve on box and blocks by at least 8 blocks (baseline=27)   Status On-going   OT LONG TERM GOAL #5   Title Pt will be able to lift 20 pounds using both hands   Status On-going   OT LONG TERM GOAL #6   Title Pt will be able to write one sentence legibly with AE prn   Status On-going   OT LONG TERM GOAL #7   Title Pt will be able to eat at least 50% of meal with R hand   Status On-going   OT LONG TERM GOAL #8   Title Pt will be able to simulate using R hand for placing objects in box/labeling as prep for return to work using both hands   Status New               Plan - 10/19/15 0925    Clinical Impression Statement Pt progressing toward goals and intiated HEP with putty   Pt will benefit from skilled therapeutic intervention in order to improve on the following deficits (Retired) Decreased coordination;Decreased strength;Impaired UE functional use;Impaired tone   Clinical Impairments Affecting Rehab Potential increased time for new learning   OT Frequency 2x / week   OT Duration 8 weeks   OT Treatment/Interventions Self-care/ADL training;Electrical Stimulation;Therapeutic exercise;Neuromuscular education;DME and/or AE instruction;Manual Therapy;Therapeutic activities;Patient/family education   Plan check HEP for putty; add coordination program for HEP if able   Consulted and Agree with Plan of Care Patient        Problem List Patient Active Problem List   Diagnosis Date Noted  . Acute CVA (cerebrovascular accident) (HCC) 09/07/2015  . HLD (hyperlipidemia)   . Pancytopenia (HCC) 09/06/2015  . 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 AKI (acute kidney injury) (HCC) 06/26/2015  . Adjustment disorder 06/26/2015  . HIV disease (HCC) 04/24/2015  . Enlarged thyroid 04/22/2015  . 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 CVA (cerebral infarction) 04/20/2015  .  Essential hypertension 04/20/2015  . Hypertensive urgency 10/15/2014    Norton Pastel, OTR/L 10/19/2015, 9:31 AM  [facility] [address] [place], [place], [address] Phone: [phone]   Fax:  [phone]  Name: Thomas [name] MRN: [id number] Date of Birth: 02/08/1946

## 2015-10-22 LAB — HIV-1 RNA ULTRAQUANT REFLEX TO GENTYP+
HIV1 RNA # SerPl PCR: 700 copies/mL
HIV1 RNA PLAS PCR-LOG#: 2.845 {Log_copies}/mL

## 2015-10-22 LAB — REFLEX TO GENOSURE(R) MG: HIV GenoSure(R) MG PDF: 0

## 2015-10-23 ENCOUNTER — Ambulatory Visit: Payer: Managed Care, Other (non HMO) | Admitting: Occupational Therapy

## 2015-10-24 ENCOUNTER — Ambulatory Visit: Payer: Managed Care, Other (non HMO) | Admitting: Occupational Therapy

## 2015-10-30 ENCOUNTER — Ambulatory Visit: Payer: Managed Care, Other (non HMO) | Admitting: Occupational Therapy

## 2015-10-30 ENCOUNTER — Encounter: Payer: Self-pay | Admitting: Internal Medicine

## 2015-10-30 ENCOUNTER — Encounter: Payer: Managed Care, Other (non HMO) | Admitting: Internal Medicine

## 2015-10-31 ENCOUNTER — Encounter: Payer: Self-pay | Admitting: Occupational Therapy

## 2015-10-31 ENCOUNTER — Ambulatory Visit: Payer: Managed Care, Other (non HMO) | Admitting: Occupational Therapy

## 2015-10-31 DIAGNOSIS — G8191 Hemiplegia, unspecified affecting right dominant side: Secondary | ICD-10-CM | POA: Diagnosis not present

## 2015-10-31 DIAGNOSIS — M6289 Other specified disorders of muscle: Secondary | ICD-10-CM

## 2015-10-31 DIAGNOSIS — IMO0002 Reserved for concepts with insufficient information to code with codable children: Secondary | ICD-10-CM

## 2015-10-31 DIAGNOSIS — M6281 Muscle weakness (generalized): Secondary | ICD-10-CM

## 2015-10-31 NOTE — Therapy (Signed)
[facility] Health Outpt Rehabilitation [facility] [address] [place], [place], [address] Phone: [phone]   Fax:  [phone]  Occupational Therapy Treatment  Patient Details  Name: Thomas [name] MRN: [id number] Date of Birth: June 08, 1946 Referring Provider: Dr. Gwynn Burly  Encounter Date: 10/31/2015      OT End of Session - 10/31/15 1744    Visit Number 3   Number of Visits 16   Date for OT Re-Evaluation 12/11/15   Authorization Type medicare - will need G code and PN every 10th visit   Authorization Time Period 60 days   Authorization - Visit Number 3   Authorization - Number of Visits 10   OT Start Time 1532   OT Stop Time 1614   OT Time Calculation (min) 42 min   Activity Tolerance Patient tolerated treatment well      Past Medical History  Diagnosis Date  . HTN (hypertension)   . Enlarged thyroid 04/22/2015    Seen on CTA 04/21/15: Contains multiple nodules measuring up to 2.1 cm in size, TSH wnl.  Needs thyroid ultrasound and biopsy  . 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  Recommend 1 year follow up with CT or MRI.   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 Immune deficiency disorder (HCC)   . HIV (human immunodeficiency virus infection) (HCC)   . Heart murmur   . Stroke [facility])     Past Surgical History  Procedure Laterality Date  . No past surgeries      There were no vitals filed for this visit.  Visit Diagnosis:  Hemiplegia affecting right dominant side (HCC)  Muscle tone increased  Muscle weakness  Lack of coordination due to stroke      Subjective Assessment - 10/31/15 1559    Subjective  My hand is working a little better   Pertinent History see epic   Patient Stated Goals to get my arm and hand working good enough to go back to work   Currently in Pain? No/denies                      OT Treatments/Exercises (OP) - 10/31/15 0001     ADLs   Eating Practiced self feeding with RUE - pt improved with built up handles so issued red foam for use at home. Pt currently eating about 25% of his meal with R hand.  Pt with improved control with build up and will work on this at home.    Writing Practiced writing with built up pen - pt now able to print first and last name legibly with cues to make letters big. Pt given foam for build up to use at home and instructed to practice writing his name.   Exercises   Exercises Hand;Wrist   Wrist Exercises   Other wrist exercises Instructed pt in wrist exercises - pt weakest in unlar deviation (3+/5) and this is mor evident with functional tasks that require increased stabilzation in the wrist to allow resistive work with the hand. Pt able to do 10 reps x2 in all directions with 1 pound weight, vc's and min guiding for alignment. Pt not yet ready to do on his own at home.    Additional Wrist Exercises   Hand Gripper with Small Beads 25# of resistance with gripper. Pt needed to use L hand to stabilize wrist and had min difficulty/dropping.  Pt with  no rest breaks.and reported fatigue of   2/10 at end of activity.                  OT Short Term Goals - 10/31/15 1742    OT SHORT TERM GOAL #1   Title Pt will be mod I with HEP - 11/13/2015   Status On-going   OT SHORT TERM GOAL #2   Title Pt will improve grip strength by at least 5 pounds in RUE (baseline = 30)   Status On-going   OT SHORT TERM GOAL #3   Title Pt will demonstrate improved coordination as evidenced by decreasing time on 9 hole peg by 20 seconds (baseline= 1.28.69   Status On-going   OT SHORT TERM GOAL #4   Title Pt will demonstrate ability for unilateral overhead reach with RUE to pick up 5 pound object x5 without dropping.   Status On-going   OT SHORT TERM GOAL #5   Title Pt will demostrate ability to write name legibly with AE prn   Status On-going   OT SHORT TERM GOAL #6   Title Pt will be able to use use R  hand for self feeding for at least 25% of meal.   Status On-going           OT Long Term Goals - 10/31/15 1743    OT LONG TERM GOAL #1   Title Pt will be mod I with upgraded HEP - 12/11/2015   Status On-going   OT LONG TERM GOAL #2   Title Pt will improve grip strength by at least 8 pounds (baseline= 30 pounds)   Status On-going   OT LONG TERM GOAL #3   Title Pt will demostrate improved coordination as evidenced by decreasing time on 9 hole peg by 30 seconds (baseline=1.28.69)   Status On-going   OT LONG TERM GOAL #4   Title Pt will improve on box and blocks by at least 8 blocks (baseline=27)   Status On-going   OT LONG TERM GOAL #5   Title Pt will be able to lift 20 pounds using both hands   Status On-going   OT LONG TERM GOAL #6   Title Pt will be able to write one sentence legibly with AE prn   Status On-going   OT LONG TERM GOAL #7   Title Pt will be able to eat at least 50% of meal with R hand   Status On-going   OT LONG TERM GOAL #8   Title Pt will be able to simulate using R hand for placing objects in box/labeling as prep for return to work using both hands   Status New               Plan - 10/31/15 1743    Clinical Impression Statement Pt making good progress toward goals and reports that he is able to use his R hand better at home.   Pt will benefit from skilled therapeutic intervention in order to improve on the following deficits (Retired) Decreased coordination;Decreased strength;Impaired UE functional use;Impaired tone   Clinical Impairments Affecting Rehab Potential increased time for new learning   OT Frequency 2x / week   OT Duration 8 weeks   OT Treatment/Interventions Self-care/ADL training;Electrical Stimulation;Therapeutic exercise;Neuromuscular education;DME and/or AE instruction;Manual Therapy;Therapeutic activities;Patient/family education   Plan strengthening of hand and wrist, functional use of R hand, check eating and writing.   Consulted  and Agree with Plan of Care Patient  Problem List Patient Active Problem List   Diagnosis Date Noted  . Acute CVA (cerebrovascular accident) (HCC) 09/07/2015  . HLD (hyperlipidemia)   . Pancytopenia (HCC) 09/06/2015  . 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 AKI (acute kidney injury) (HCC) 06/26/2015  . Adjustment disorder 06/26/2015  . HIV disease (HCC) 04/24/2015  . Enlarged thyroid 04/22/2015  . 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 CVA (cerebral infarction) 04/20/2015  . Essential hypertension 04/20/2015  . Hypertensive urgency 10/15/2014    Norton Pastel, OTR/L 10/31/2015, 5:45 PM  [facility] [address] [place], [place], [address] Phone: [phone]   Fax:  [phone]  Name: Mclane Arora MRN: [id number] Date of Birth: 1946-05-23

## 2015-11-01 NOTE — Progress Notes (Signed)
Patient left before being seen.

## 2015-11-02 ENCOUNTER — Encounter: Payer: Self-pay | Admitting: Occupational Therapy

## 2015-11-02 ENCOUNTER — Ambulatory Visit: Payer: Managed Care, Other (non HMO) | Attending: Internal Medicine | Admitting: Occupational Therapy

## 2015-11-02 ENCOUNTER — Ambulatory Visit: Payer: Medicare Other | Admitting: Internal Medicine

## 2015-11-02 ENCOUNTER — Encounter: Payer: Self-pay | Admitting: Internal Medicine

## 2015-11-02 ENCOUNTER — Ambulatory Visit (INDEPENDENT_AMBULATORY_CARE_PROVIDER_SITE_OTHER): Payer: Managed Care, Other (non HMO) | Admitting: Internal Medicine

## 2015-11-02 VITALS — BP 148/80 | HR 64 | Temp 98.2°F | Ht 69.0 in | Wt 145.6 lb

## 2015-11-02 DIAGNOSIS — IMO0002 Reserved for concepts with insufficient information to code with codable children: Secondary | ICD-10-CM

## 2015-11-02 DIAGNOSIS — G8191 Hemiplegia, unspecified affecting right dominant side: Secondary | ICD-10-CM

## 2015-11-02 DIAGNOSIS — Z8673 Personal history of transient ischemic attack (TIA), and cerebral infarction without residual deficits: Secondary | ICD-10-CM | POA: Diagnosis not present

## 2015-11-02 DIAGNOSIS — I63312 Cerebral infarction due to thrombosis of left middle cerebral artery: Secondary | ICD-10-CM

## 2015-11-02 DIAGNOSIS — M6249 Contracture of muscle, multiple sites: Secondary | ICD-10-CM | POA: Insufficient documentation

## 2015-11-02 DIAGNOSIS — M6281 Muscle weakness (generalized): Secondary | ICD-10-CM | POA: Diagnosis present

## 2015-11-02 DIAGNOSIS — Z0000 Encounter for general adult medical examination without abnormal findings: Secondary | ICD-10-CM

## 2015-11-02 DIAGNOSIS — I6980 Unspecified sequelae of other cerebrovascular disease: Secondary | ICD-10-CM | POA: Insufficient documentation

## 2015-11-02 DIAGNOSIS — I10 Essential (primary) hypertension: Secondary | ICD-10-CM

## 2015-11-02 DIAGNOSIS — Z7982 Long term (current) use of aspirin: Secondary | ICD-10-CM | POA: Diagnosis not present

## 2015-11-02 DIAGNOSIS — M6289 Other specified disorders of muscle: Secondary | ICD-10-CM

## 2015-11-02 DIAGNOSIS — Z79899 Other long term (current) drug therapy: Secondary | ICD-10-CM

## 2015-11-02 DIAGNOSIS — R279 Unspecified lack of coordination: Secondary | ICD-10-CM | POA: Insufficient documentation

## 2015-11-02 NOTE — Assessment & Plan Note (Signed)
BP Readings from Last 3 Encounters:  11/02/15 148/80  10/30/15 143/84  10/12/15 147/84    Lab Results  Component Value Date   NA 141 10/05/2015   K 4.6 10/05/2015   CREATININE 1.45* 10/05/2015    Assessment: Blood pressure control:  Controlled Progress toward BP goal:   Fair control Comments: Lisinopril-HCTZ 40-25 daily  Plan: Medications:  Cont meds.

## 2015-11-02 NOTE — Therapy (Signed)
[facility] [address] [place], [place], [address] Phone: [phone]   Fax:  [phone]  Occupational Therapy Treatment  Patient Details  Name: Thomas [name] MRN: [id number] Date of Birth: [birth year] Referring Provider: Dr. Gwynn Burly  Encounter Date: 11/02/2015      OT End of Session - 11/02/15 1703    Visit Number 4   Number of Visits 16   Date for OT Re-Evaluation 12/11/15   Authorization Type medicare - will need G code and PN every 10th visit   Authorization Time Period 60 days   Authorization - Visit Number 4   Authorization - Number of Visits 10   OT Start Time 1446   OT Stop Time 1528   OT Time Calculation (min) 42 min   Activity Tolerance Patient tolerated treatment well      Past Medical History  Diagnosis Date  . HTN (hypertension)   . Enlarged thyroid 04/22/2015    Seen on CTA 04/21/15: Contains multiple nodules measuring up to 2.1 cm in size, TSH wnl.  Needs thyroid ultrasound and biopsy  . 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  Recommend 1 year follow up with CT or MRI.   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 Immune deficiency disorder (HCC)   . HIV (human immunodeficiency virus infection) (HCC)   . Heart murmur   . Stroke [facility])     Past Surgical History  Procedure Laterality Date  . No past surgeries      There were no vitals filed for this visit.  Visit Diagnosis:  Hemiplegia affecting right dominant side (HCC)  Muscle tone increased  Muscle weakness  Lack of coordination due to stroke      Subjective Assessment - 11/02/15 1452    Subjective  I am planning on going back to work in 2 weeks   Pertinent History see epic   Patient Stated Goals to get my arm and hand working good enough to go back to work   Currently in Pain? Yes   Pain Score 2    Pain Location Hand   Pain Orientation Right   Pain  Descriptors / Indicators Sore  muscle soreness from exercises   Pain Type Acute pain   Pain Onset 1 to 4 weeks ago   Pain Frequency Constant   Aggravating Factors  if I use my hand too much   Pain Relieving Factors resting my hand some.                                OT Short Term Goals - 11/02/15 1701    OT SHORT TERM GOAL #1   Title Pt will be mod I with HEP - 11/13/2015   Status On-going   OT SHORT TERM GOAL #2   Title Pt will improve grip strength by at least 5 pounds in RUE (baseline = 30)   Status On-going   OT SHORT TERM GOAL #3   Title Pt will demonstrate improved coordination as evidenced by decreasing time on 9 hole peg by 20 seconds (baseline= 1.28.69   Status Achieved   OT SHORT TERM GOAL #4   Title Pt will demonstrate ability for unilateral overhead reach with RUE to pick up 5 pound object x5 without dropping.   Status On-going   OT SHORT TERM GOAL #5  Title Pt will demostrate ability to write name legibly with AE prn   Status Achieved   OT SHORT TERM GOAL #6   Title Pt will be able to use use R hand for self feeding for at least 25% of meal.   Status Achieved           OT Long Term Goals - 11/02/15 1702    OT LONG TERM GOAL #1   Title Pt will be mod I with upgraded HEP - 12/11/2015   Status On-going   OT LONG TERM GOAL #2   Title Pt will improve grip strength by at least 8 pounds (baseline= 30 pounds)   Status On-going   OT LONG TERM GOAL #3   Title Pt will demostrate improved coordination as evidenced by decreasing time on 9 hole peg by 30 seconds (baseline=1.28.69)   Status On-going   OT LONG TERM GOAL #4   Title Pt will improve on box and blocks by at least 8 blocks (baseline=27)   Status On-going   OT LONG TERM GOAL #5   Title Pt will be able to lift 20 pounds using both hands   Status On-going   OT LONG TERM GOAL #6   Title Pt will be able to write one sentence legibly with AE prn   Status On-going   OT LONG TERM GOAL #7    Title Pt will be able to eat at least 50% of meal with R hand   Status On-going   OT LONG TERM GOAL #8   Title Pt will be able to simulate using R hand for placing objects in box/labeling as prep for return to work using both hands   Status New      Treatment:  Addressed wrist strength using 1 pound weight for 12 reps x2 for wrist flexion, extension, ulnar and radial deviation. Pt with greatest difficulty with ulnar deviation and wrist extension.  Also utilized multiple  therapeutic activities to address R hand strength including sustained strength via writing task - pt able to write full name 4 times with built up pen before hand fatigues.          Plan - 11/02/15 1702    Clinical Impression Statement Pt making steady progress toward goals. Pt hoping return to work in 2 weeks if possible with job modifications   Pt will benefit from skilled therapeutic intervention in order to improve on the following deficits (Retired) Decreased coordination;Decreased strength;Impaired UE functional use;Impaired tone   Clinical Impairments Affecting Rehab Potential increased time for new learning   OT Frequency 2x / week   OT Duration 8 weeks   OT Treatment/Interventions Self-care/ADL training;Electrical Stimulation;Therapeutic exercise;Neuromuscular education;DME and/or AE instruction;Manual Therapy;Therapeutic activities;Patient/family education   Plan strengthening of Hand and wrist, functional use of hand, continue to work on sustainted writing   Consulted and Agree with Plan of Care Patient        Problem List Patient Active Problem List   Diagnosis Date Noted  . Visit for preventive health examination 11/02/2015  . Acute CVA (cerebrovascular accident) (HCC) 09/07/2015  . HLD (hyperlipidemia)   . Pancytopenia (HCC) 09/06/2015  . 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 AKI (acute kidney injury) (HCC) 06/26/2015  . Adjustment  disorder 06/26/2015  . HIV disease (HCC) 04/24/2015  . Enlarged thyroid 04/22/2015  . 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 CVA (cerebral infarction) 04/20/2015  . Essential hypertension 04/20/2015  . Hypertensive urgency  10/15/2014    Norton Pastel, OTR/L 11/02/2015, 5:05 PM  [facility] [address] [place], [place], [address] Phone: [phone]   Fax:  [phone]  Name: Thomas [name] MRN: [id number] Date of Birth: 07/07/1946

## 2015-11-02 NOTE — Progress Notes (Signed)
Patient ID: Thomas [name], male   DOB: 05-24-1946, 70 y.o.   MRN: [id number]   Subjective:   Patient ID: Thomas [name] male   DOB: February 02, 1946 70 y.o.   MRN: [id number]  HPI: Mr.Epifanio Zick is a 70 y.o. presented to clinic today to follow up on HTN and CVA requests a work note, Pt was admitted- in JAnuary 2017 for a CVA, he had Right sided facial nuimbness, and weakness and numbness of his right extremity which mostly resolved within a few hours. 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 He has been compliant with all his meds. He has no complaints today, no new deficits. He would like to resume work- 11/13/2015.  Past Medical History  Diagnosis Date  . HTN (hypertension)   . Enlarged thyroid 04/22/2015    Seen on CTA 04/21/15: Contains multiple nodules measuring up to 2.1 cm in size, TSH wnl.  Needs thyroid ultrasound and biopsy  . 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  Recommend 1 year follow up with CT or MRI.   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 Immune deficiency disorder (HCC)   . HIV (human immunodeficiency virus infection) (HCC)   . Heart murmur   . Stroke [facility])    Review of Systems: CONSTITUTIONAL- No Fever, or change in appetite. SKIN- No Rash, or itching. HEAD- No Headache or dizziness. Mouth/throat- No Sorethroat, or bleeding gums. RESPIRATORY- No Cough or SOB. CARDIAC- No Palpitations, or chest pain. GI- No nausea, abd pain. URINARY- No Frequency, or dysuria. NEUROLOGIC- No Numbness, or burning. [facility]- Denies depression or anxiety.  Objective:  Physical Exam: Filed Vitals:   11/02/15 0823  BP: 148/80  Pulse: 64  Temp: 98.2 F (36.8 C)  TempSrc: Oral  Height:  (1.753 m)  Weight: 145 lb 9.6 oz (66.044 kg)  SpO2: 100%   GENERAL- alert, co-operative, appears as stated  age, not in any distress. HEENT- Atraumatic, normocephalic, PERRL, oral mucosa appears moist CARDIAC- RRR, no murmurs, rubs or gallops. RESP- Moving equal volumes of air, no wheezes or crackles. ABDOMEN- Soft, nontender,  BACK- Normal curvature of the spine NEURO- No obvious Cr N abnormality, strenght upper and lower extremities- 5/5, Sensation intact- globally, Gait- Normal. EXTREMITIES- warm, no pedal edema. PSYCH- Normal mood and affect, appropriate thought content and speech.  Assessment & Plan:   The patient's case and plan of care was discussed with attending physician, Dr. Cyndie Chime. Please see problem based charting for assessment and plan.

## 2015-11-02 NOTE — Progress Notes (Signed)
Medicine attending: Medical history, presenting problems, physical findings, and medications, reviewed with resident physician Dr Ejiro Emokpae on the day of the patient visit and I concur with her evaluation and management plan. 

## 2015-11-02 NOTE — Assessment & Plan Note (Signed)
Stable, no deficits. On Lisinopril-HCTZ, aspirin, and lipitor, compliant with meds. Was on a holter monitor for 30 days- has sent in holter monitor to company, he was not called about any abnormalities.

## 2015-11-02 NOTE — Assessment & Plan Note (Addendum)
Declined Colonoscopy, does not get flu shot. Plan- Stool cards next visit.

## 2015-11-03 NOTE — Progress Notes (Signed)
This encounter was created in error - please disregard.

## 2015-11-07 ENCOUNTER — Encounter: Payer: Self-pay | Admitting: Neurology

## 2015-11-07 ENCOUNTER — Ambulatory Visit (INDEPENDENT_AMBULATORY_CARE_PROVIDER_SITE_OTHER): Payer: Managed Care, Other (non HMO) | Admitting: Neurology

## 2015-11-07 ENCOUNTER — Ambulatory Visit: Payer: Managed Care, Other (non HMO) | Admitting: Occupational Therapy

## 2015-11-07 ENCOUNTER — Encounter: Payer: Self-pay | Admitting: Occupational Therapy

## 2015-11-07 VITALS — BP 142/87 | HR 68 | Ht 69.0 in | Wt 146.8 lb

## 2015-11-07 DIAGNOSIS — I639 Cerebral infarction, unspecified: Secondary | ICD-10-CM

## 2015-11-07 DIAGNOSIS — I10 Essential (primary) hypertension: Secondary | ICD-10-CM | POA: Diagnosis not present

## 2015-11-07 DIAGNOSIS — G8191 Hemiplegia, unspecified affecting right dominant side: Secondary | ICD-10-CM

## 2015-11-07 DIAGNOSIS — IMO0002 Reserved for concepts with insufficient information to code with codable children: Secondary | ICD-10-CM

## 2015-11-07 DIAGNOSIS — I63312 Cerebral infarction due to thrombosis of left middle cerebral artery: Secondary | ICD-10-CM

## 2015-11-07 DIAGNOSIS — M6281 Muscle weakness (generalized): Secondary | ICD-10-CM

## 2015-11-07 DIAGNOSIS — M6289 Other specified disorders of muscle: Secondary | ICD-10-CM

## 2015-11-07 DIAGNOSIS — B20 Human immunodeficiency virus [HIV] disease: Secondary | ICD-10-CM | POA: Diagnosis not present

## 2015-11-07 MED ORDER — ASPIRIN 81 MG PO TBEC
325.0000 mg | DELAYED_RELEASE_TABLET | Freq: Every day | ORAL | Status: DC
Start: 2015-11-07 — End: 2015-11-07

## 2015-11-07 MED ORDER — ASPIRIN EC 325 MG PO TBEC: 325.0000 mg | DELAYED_RELEASE_TABLET | Freq: Every day | ORAL | Status: DC

## 2015-11-07 NOTE — Therapy (Signed)
[facility] [address] [place], [place], [address] Phone: [phone]   Fax:  [phone]  Occupational Therapy Treatment  Patient Details  Name: Thomas [name] MRN: [id number] Date of Birth: [birth year] Referring Provider: Dr. Jule Ser  Encounter Date: 11/07/2015      OT End of Session - 11/07/15 1314    Visit Number 5   Number of Visits 16   Date for OT Re-Evaluation 12/11/15   Authorization Type medicare - will need G code and PN every 10th visit   Authorization Time Period 60 days   Authorization - Visit Number 5   Authorization - Number of Visits 10   OT Start Time 1017   OT Stop Time 1059   OT Time Calculation (min) 42 min   Activity Tolerance Patient tolerated treatment well      Past Medical History  Diagnosis Date  . HTN (hypertension)   . Enlarged thyroid 04/22/2015    Seen on CTA 04/21/15: Contains multiple nodules measuring up to 2.1 cm in size, TSH wnl.  Needs thyroid ultrasound and biopsy  . Ectatic thoracic aorta ([place]) 04/22/2015    Noted on CTA on 04/21/15.  Recommend 1 year follow up with CT or MRI.   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 Immune deficiency disorder ([place])   . HIV (human immunodeficiency virus infection) ([place])   . Heart murmur   . Stroke [facility])     Past Surgical History  Procedure Laterality Date  . No past surgeries      There were no vitals filed for this visit.  Visit Diagnosis:  Hemiplegia affecting right dominant side (HCC)  Muscle tone increased  Muscle weakness  Lack of coordination due to stroke      Subjective Assessment - 11/07/15 1021    Subjective  I go back to work on Monday.    Pertinent History see epic   Patient Stated Goals to get my arm and hand working good enough to go back to work                      OT Treatments/Exercises (OP) - 11/07/15 0001    ADLs   ADL Comments checked STG's  see goal section for update. Reviewed STG and LTG's with pt.    Exercises   Exercises Hand   Hand Exercises   Hand Gripper with Small Beads 35# of resistance with 1 inch blocks. Pt needed multiple rest breaks but able to complete several blocks with min dropping and moderate difficulty.    Other Hand Exercises Also addressed fine motor coordination with activity that focused on in hand manipulation and increasing speed.  Pt with improved ability to pick up and  manipulate small objects however speed remains signficantly impaired.                   OT Short Term Goals - 11/07/15 1100    OT SHORT TERM GOAL #1   Title Pt will be mod I with HEP - 11/13/2015   Status Achieved   OT SHORT TERM GOAL #2   Title Pt will improve grip strength by at least 5 pounds in RUE (baseline = 30)   Status Achieved   OT SHORT TERM GOAL #3   Title Pt will demonstrate improved coordination as evidenced by decreasing time on 9 hole peg by 20 seconds (baseline= 1.28.69   Status Achieved  on #  3 slot   OT SHORT TERM GOAL #4   Title Pt will demonstrate ability for unilateral overhead reach with RUE to pick up 5 pound object x5 without dropping.   Status Achieved   OT SHORT TERM GOAL #5   Title Pt will demostrate ability to write name legibly with AE prn   Status Achieved   OT SHORT TERM GOAL #6   Title Pt will be able to use use R hand for self feeding for at least 25% of meal.   Status Achieved           OT Long Term Goals - 11/07/15 1101    OT LONG TERM GOAL #1   Title Pt will be mod I with upgraded HEP - 12/11/2015   Status On-going   OT LONG TERM GOAL #2   Title Pt will improve grip strength by at least 8 pounds (baseline= 30 pounds)   Status On-going   OT LONG TERM GOAL #3   Title Pt will demostrate improved coordination as evidenced by decreasing time on 9 hole peg by 30 seconds (baseline=1.28.69)   Status On-going   OT LONG TERM GOAL #4   Title Pt will improve on box and blocks by at  least 8 blocks (baseline=27)   Status On-going   OT LONG TERM GOAL #5   Title Pt will be able to lift 20 pounds using both hands   Status On-going   OT LONG TERM GOAL #6   Title Pt will be able to write one sentence legibly with AE prn   Status On-going   OT LONG TERM GOAL #7   Title Pt will be able to eat at least 50% of meal with R hand   Status On-going   OT LONG TERM GOAL #8   Title Pt will be able to simulate using R hand for placing objects in box/labeling as prep for return to work using both hands   Status New               Plan - 11/07/15 1313    Clinical Impression Statement Pt has met all STG"s and is now working toward LTG's.  Pt very motivated to regain as much function in R hand as possible   Pt will benefit from skilled therapeutic intervention in order to improve on the following deficits (Retired) Decreased coordination;Decreased strength;Impaired UE functional use;Impaired tone   Clinical Impairments Affecting Rehab Potential increased time for new learning   OT Frequency 2x / week   OT Duration 8 weeks   OT Treatment/Interventions Self-care/ADL training;Electrical Stimulation;Therapeutic exercise;Neuromuscular education;DME and/or AE instruction;Manual Therapy;Therapeutic activities;Patient/family education   Plan strengthening of R hand and wrist, coordination, fucnctional use of R hand, writing   Consulted and Agree with Plan of Care Patient        Problem List Patient Active Problem List   Diagnosis Date Noted  . Visit for preventive health examination 11/02/2015  . Acute CVA (cerebrovascular accident) ([place]) 09/07/2015  . HLD (hyperlipidemia)   . Pancytopenia ([place]) 09/06/2015  . Cerebrovascular accident (CVA) due to thrombosis of left middle cerebral artery ([place]) 07/05/2015  . AKI (acute kidney injury) ([place]) 06/26/2015  . Adjustment disorder 06/26/2015  . HIV disease ([place]) 04/24/2015  . Enlarged thyroid 04/22/2015  . Ectatic thoracic aorta ([place])  04/22/2015  . CVA (cerebral infarction) 04/20/2015  . Essential hypertension 04/20/2015  . Hypertensive urgency 10/15/2014    Quay Burow, OTR/L 11/07/2015, 1:15 PM  [facility] [address] [place], [place], [address] Phone: [phone]   Fax:  [phone]  Name: Haydn Hutsell MRN: [id number] Date of Birth: [birth year]

## 2015-11-07 NOTE — Patient Instructions (Signed)
-   increase ASA from 81mg  to 325mg , you can buy from the drug store. - continue lipitor for stroke prevention - follow up ID and IM for HTN and HIV management. Compliant with medication. - check BP at home and record - Follow up with your primary care physician for stroke risk factor modification. Recommend maintain blood pressure goal <130/80, diabetes with hemoglobin A1c goal below 6.5% and lipids with LDL cholesterol goal below 70 mg/dL.  - will repeat CTA head and neck next vist to follow up with left PCOM aneurysm and aortic arch  - follow up in 6 months.

## 2015-11-07 NOTE — Progress Notes (Signed)
STROKE NEUROLOGY FOLLOW UP NOTE  NAME: Thomas [name] DOB: [birth year]  REASON FOR VISIT: stroke follow up HISTORY FROM: pt and chart  Today we had the pleasure of seeing Thomas [name] in follow-up at our Neurology [facility]. Pt was accompanied by no one.   History Summary Thomas [name] is a 70 y.o. male with history of hypertension was admitted on 04/20/15 for right facial droop and slurred speech. 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 CTA showed no large vessel occlusion but left 4mm PCOM aneurysm. TTE showed EF 50-55%. LDL 79 and A1c 6.0. 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 RPR was negative. He was put on ASA and lipitor and discharged with outpt follow up  07/05/15 follow up - the patient has been doing well from stroke standpoint. No recurrent stroke like symptoms. He follows with PCP for HTN and increased dose of lisinopril/HCTZ, currently in good control. Today BP 128/85. He also follows with ID Dr. Luciana Axe and is about to start HARRT therapy pending insurance approval. His Cre also improved well. No other complains.   09/06/15 Admission - pt was admitted again for right arm weakness and tingling. MRI showed right temporal horn WM punctate infarct, not corresponding to his symptoms. His symptoms quickly resolved and he was out of HAART meds at that time with WBC drooping from 3.4 to 1.9 and increased viral load. Therefore, his new right temporal horn WM punctate infarct likely to be incidental finding related to HIV not on HAART, and his symptoms likely due to recrudescence of old left BG/CR infarct on 04/20/15. His CUS, TTE, LDL all negative. He was continued on ASA and lipitor on discharge.   Interval History During the interval time,  Pt has been doing well. Taking HARRT meds as instructed and following up with ID. Repeat viral load much improved. Had fine needle aspiration in 10/2015 and pathology  showed benign thyroid nodule. 30 day cardiac monitor has been done and pending result. Pt BP at home controlled well 110-130. Today 142/87. He is actually taking ASA  at home.   REVIEW OF SYSTEMS: Full 14 system review of systems performed and notable only for those listed below and in HPI above, all others are negative:  Constitutional:   Cardiovascular:  Ear/Nose/Throat:   Skin:  Eyes:   Respiratory:   Gastroitestinal:   Genitourinary:  Hematology/Lymphatic:   Endocrine:  Musculoskeletal:   Allergy/Immunology:   Neurological:   Psychiatric:  Sleep:   The following represents the patient's updated allergies and side effects list: No Known Allergies  The neurologically relevant items on the patient's problem list were reviewed on today's visit.  Neurologic Examination  A problem focused neurological exam (12 or more points of the single system neurologic examination, vital signs counts as 1 point, cranial nerves count for 8 points) was performed.  Blood pressure 142/87, pulse 68, height  (1.753 m), weight 146 lb 12.8 oz (66.588 kg).  General - Well nourished, well developed, in no apparent distress.  Ophthalmologic - Sharp disc margins OU.   Cardiovascular - Regular rate and rhythm with no murmur.  Mental Status -  Level of arousal and orientation to time, place, and person were intact. Language including expression, naming, repetition, comprehension was assessed and found intact. Fund of Knowledge was assessed and was intact.  Cranial Nerves II - XII - II - Visual field intact OU. III, IV, VI -  Extraocular movements intact. V - Facial sensation intact bilaterally. VII - right mild flattening of nasolabial fold. VIII - Hearing & vestibular intact bilaterally. X - Palate elevates symmetrically.Marland Kitchen. XI - Chin turning & shoulder shrug intact bilaterally XII - Tongue protrusion intact.  Motor Strength - The patient's strength was normal in all extremities and  pronator drift was absent. Bulk was normal and fasciculations were absent.  Motor Tone - Muscle tone was assessed at the neck and appendages and was normal.  Reflexes - The patient's reflexes were 1+ in all extremities and he had no pathological reflexes.  Sensory - Light touch, temperature/pinprick were assessed and were symmetrical.   Coordination - The patient had normal movements in the hands and feet with no ataxia or dysmetria. Tremor was absent.  Gait and Station - The patient's transfers, posture, gait, station, and turns were observed as normal.  Data reviewed: I personally reviewed the images and agree with the radiology interpretations.  Ct Angio Head W/cm &/or Wo Cm 04/20/2015  1. No intracranial medium or large vessel occlusion or stenosis.  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  3. No cervical carotid or vertebral artery stenosis.  4. Ectatic aortic arch. Recommend annual imaging followup by CTA or MRA.  Dg Chest 2 View 04/20/2015  Borderline cardiac enlargement and probable mild bronchitic lung changes but no acute pulmonary findings.   Ct Head Wo [name] 04/20/2015  Patchy supratentorial small vessel disease.  There is a degree of increased attenuation in a portion of the left middle cerebral artery compared to the right.  The significance of this finding is uncertain.  This finding potentially could indicate the earliest changes of a left middle cerebral artery distribution infarct.  No hemorrhage or mass effect.  Areas of paranasal sinus disease.   Thomas [name] 04/20/2015  Moderate-sized area of restricted diffusion affects the LEFT centrum semiovale, perhaps involving the superior portion of the LEFT lentiform nucleus, consistent with an acute LEFT MCA lenticulostriate territory infarct.  No hemorrhagic transformation.  No visible large vessel occlusion as correlated with recent CTA and based on  intracranial flow voids.  Sequelae of hypertensive cerebral vascular disease, with multiple areas of chronic hemorrhage in the brain as well as moderately advanced small vessel disease.   2D echo 04/22/15 - Left ventricle: The cavity size was normal. Wall thickness was increased in a pattern of moderate LVH. Systolic function was normal. The estimated ejection fraction was in the range of 50% to 55%. Wall motion was normal; there were no regional wall motion abnormalities. Doppler parameters are consistent with abnormal left ventricular relaxation (grade 1 diastolic dysfunction). - Aortic valve: There was mild regurgitation. - Left atrium: The atrium was moderately dilated. - Right atrium: The atrium was moderately dilated. Impressions: - Normal LV systolic function; grade 1 diastolic dysfunction; moderate biatrial enlargement; calcified aortic valve with mild eccentric AI; trace Thomas and TR.  Ct Head Wo [name] 09/06/2015 Mild chronic ischemic white matter disease. Old left periventricular white matter infarction and old infarction adjacent to right frontal horn infarction. No acute intracranial abnormality seen.   Thomas [name] 09/06/2015 1. Punctate nonhemorrhagic infarct along the posterior right temporal and occipital lobe adjacent to the ventricle. 2. Expected evolution of remote infarct involving the left lentiform nucleus and corona radiata. 3. Multiple foci of remote hemorrhage involving the thalami bilaterally as well as the left temporal and occipital lobe. Remote hemorrhage in the left paramedian pons. 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 Amyloid angiopathy is considered. 4. Mild stable sinus disease.   TTE 09/07/15 - Left ventricle: The cavity size was normal. Wall thickness wasincreased in a pattern of mild LVH. Systolic function was normal.The estimated ejection fraction was in the range of 50% to 55%.Wall motion was normal;  there were no regional wall motionabnormalities. Doppler parameters are consistent with abnormalleft ventricular relaxation (grade 1 diastolic dysfunction). - Aortic valve: There was mild to moderate regurgitation. - Pulmonary arteries: Systolic pressure was mildly increased. PApeak pressure: 35 mm Hg (S). - Impressions: Normal LV systolic function; grade 1 diastolic dysfunction; mildLVH; sclerotic aortic valve with mild to moderate Thomas; mild TRwith mildly elevated pulmonary pressure.  Carotid Duplex - Mild intimal thickening without evidence of stenosis noted. Vertebral arteries - antegrade flow bilaterally.  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  30 day cardiac event monitoring - pending   Component     Latest Ref Rng 04/20/2015 04/21/2015 04/26/2015 05/05/2015  Cholesterol     0 - 200 mg/dL  161    Triglycerides     <150 mg/dL  36    HDL Cholesterol     >40 mg/dL  29 (L)    Total CHOL/HDL Ratio       4.0    VLDL     0 - 40 mg/dL  7    LDL (calc)     0 - 99 mg/dL  79    HIV 1 AB     Negative Positive (A)     HIV 2 AB     Negative Negative     Note      Comment     Hemoglobin A1C     4.8 - 5.6 %  6.0 (H)    Mean Plasma Glucose       126    HIV-1 RNA Viral Load         15710  HIV-1 RNA Viral Load Log         4.196  HIV     Non Reactive Comment     TSH     0.350 - 4.500 uIU/mL 1.496   0.823  RPR     Non Reactive   Non Reactive   T4, Total     4.5 - 12.0 ug/dL    8.8   Component     Latest Ref Rng 06/22/2015 09/07/2015 10/05/2015  Cholesterol     0 - 200 mg/dL  93   Triglycerides     <150 mg/dL  25   HDL Cholesterol     >40 mg/dL  30 (L)   Total CHOL/HDL Ratio       3.1   VLDL     0 - 40 mg/dL  5   LDL (calc)     0 - 99 mg/dL  58   HIV1 RNA # SerPl PCR        700  HIV1 RNA Plas PCR-Log#        2.845  Methodology        Comment  HIV 1 RNA Quant       119000   log10  HIV-1 RNA       5.076   CD4 T Cell Abs     400 - 2700 /uL  170 (L)   CD4 % Helper T Cell     33 - 55 %  13 (L)   Hemoglobin A1C  4.8 - 5.6 %  5.8 (H)   Mean Plasma Glucose       120   TSH     0.350 - 4.500 uIU/mL 0.646    RPR     Non Reactive  Non Reactive     Assessment: As you may recall, he is a 70 y.o. African American male with PMH of hypertension was admitted on 04/20/15 for left BG/CR infarct, size somehow larger than typical lacunar infarct. CTA showed no large vessel occlusion but left 4mm PCOM aneurysm. TTE showed EF 50-55%. LDL 79 and A1c 6.0. 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 RPR was negative. 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 He was put on ASA and lipitor. He was readmitted in 09/2015 for transient right arm weakness and numbness. However, MRI showed new right temporal horn WM punctate infarct, not correspondent to his symptoms which was likely due to recrudescence from old stroke as he was out of HAART meds with increased viral load and dropping WBC. He was restarted on HAART. During the interval time, the patient has been doing well. Viral load much improved. He also follows ID for HARRT therapy. thyroid nodule pathology is benign. 30 day monitoring pending report. Continue ASA and lipitor, but ASA recommend to be .  Plan:  - increase ASA from  to . - continue lipitor for stroke prevention - follow up ID and IM for HTN and HIV management. Compliant with medication. - check BP at home and record - Follow up with your primary care physician for stroke risk factor modification. Recommend maintain blood pressure goal <130/80, diabetes with hemoglobin A1c goal below 6.5% and lipids with LDL cholesterol goal below 70 mg/dL.  - will repeat CTA head and neck next vist to follow up with left PCOM aneurysm and aortic arch  - follow up with 30 day cardiac event monitoring result - follow up in 6 months.  I spent more than  25 minutes of face to face time with the patient. Greater than 50% of time was spent in counseling and coordination of care.   No orders of the defined types were placed in this encounter.    Meds ordered this encounter  Medications  . DISCONTD: aspirin 81 MG EC tablet    Sig: Take 4 tablets (325 mg total) by mouth daily.    Dispense:  30 tablet    Refill:  5  . aspirin EC 325 MG tablet    Sig: Take 1 tablet (325 mg total) by mouth daily.    Dispense:  30 tablet    Refill:  0    Patient Instructions  - increase ASA from  to , you can buy from the drug store. - continue lipitor for stroke prevention - follow up ID and IM for HTN and HIV management. Compliant with medication. - check BP at home and record - Follow up with your primary care physician for stroke risk factor modification. Recommend maintain blood pressure goal <130/80, diabetes with hemoglobin A1c goal below 6.5% and lipids with LDL cholesterol goal below 70 mg/dL.  - will repeat CTA head and neck next vist to follow up with left PCOM aneurysm and aortic arch  - follow up in 6 months.    Marvel Plan, MD PhD [facility] [address] [place], [place] [address] [phone]

## 2015-11-13 ENCOUNTER — Ambulatory Visit: Payer: Managed Care, Other (non HMO) | Admitting: Occupational Therapy

## 2015-11-13 ENCOUNTER — Encounter: Payer: Self-pay | Admitting: Occupational Therapy

## 2015-11-13 DIAGNOSIS — M6281 Muscle weakness (generalized): Secondary | ICD-10-CM

## 2015-11-13 DIAGNOSIS — M6289 Other specified disorders of muscle: Secondary | ICD-10-CM

## 2015-11-13 DIAGNOSIS — IMO0002 Reserved for concepts with insufficient information to code with codable children: Secondary | ICD-10-CM

## 2015-11-13 DIAGNOSIS — G8191 Hemiplegia, unspecified affecting right dominant side: Secondary | ICD-10-CM

## 2015-11-13 NOTE — Therapy (Signed)
[facility] [address] [place], [place], [address] Phone: [phone]   Fax:  [phone]  Occupational Therapy Treatment  Patient Details  Name: Thomas NordmannWilliam [name] MRN: [id number] Date of Birth: 06/16/1946 Referring Provider: Dr. Gwynn BurlyAndrew Wallace  Encounter Date: 11/13/2015      OT End of Session - 11/13/15 1359    Visit Number 6   Number of Visits 16   Date for OT Re-Evaluation 12/11/15   Authorization Type medicare - will need G code and PN every 10th visit   Authorization Time Period 60 days   Authorization - Visit Number 6   Authorization - Number of Visits 10   OT Start Time 1316   OT Stop Time 1359   OT Time Calculation (min) 43 min   Activity Tolerance Patient tolerated treatment well      Past Medical History  Diagnosis Date  . HTN (hypertension)   . Enlarged thyroid 04/22/2015    Seen on CTA 04/21/15: Contains multiple nodules measuring up to 2.1 cm in size, TSH wnl.  Needs thyroid ultrasound and biopsy  . 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  Recommend 1 year follow up with CT or MRI.   Marland Kitchen. 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 Immune deficiency disorder (HCC)   . HIV (human immunodeficiency virus infection) (HCC)   . Heart murmur   . Stroke [facility](HCC)     Past Surgical History  Procedure Laterality Date  . No past surgeries      There were no vitals filed for this visit.  Visit Diagnosis:  Hemiplegia affecting right dominant side (HCC)  Muscle tone increased  Muscle weakness  Lack of coordination due to stroke      Subjective Assessment - 11/13/15 1324    Subjective  I started back to work today   Pertinent History see epic   Patient Stated Goals to get my arm and hand working good enough to go back to work   Currently in Pain? No/denies                      OT Treatments/Exercises (OP) - 11/13/15 0001    ADLs    Writing Practiced writing with brown foam on pen to aide in grip. Focus on building up length of writing so attempted 1 full sentence with printing. Pt cued to use big letters and to use vision to compensate for sensory impairment in R hand as he writes.  Pt needed  mod vc's to focus on size of letters. Pt has been practicing writing name at home - have now asked pt to practice writing at sentence level. Pt verbalized understanding and agreement.    Exercises   Exercises Hand   Hand Exercises   In Hand Manipulation Training Therapeutic tasks to address in hand manipulation, bilateral tasks that require fine  motor (putting labels on bottles),    Other Hand Exercises Also addresed wrist flexion/extension and ulnar deviation with 2 pound weight.                 OT Education - 11/13/15 1350    Education provided Yes   Education Details incresaed writing, wrist exercises   Person(s) Educated Patient   Methods Explanation;Demonstration;Verbal cues;Handout   Comprehension Verbalized understanding;Returned demonstration          OT Short Term Goals - 11/13/15 1351    OT  SHORT TERM GOAL #1   Title Pt will be mod I with HEP - 11/13/2015   Status Achieved   OT SHORT TERM GOAL #2   Title Pt will improve grip strength by at least 5 pounds in RUE (baseline = 30)   Status Achieved   OT SHORT TERM GOAL #3   Title Pt will demonstrate improved coordination as evidenced by decreasing time on 9 hole peg by 20 seconds (baseline= 1.28.69   Status Achieved  on #3 slot   OT SHORT TERM GOAL #4   Title Pt will demonstrate ability for unilateral overhead reach with RUE to pick up 5 pound object x5 without dropping.   Status Achieved   OT SHORT TERM GOAL #5   Title Pt will demostrate ability to write name legibly with AE prn   Status Achieved   OT SHORT TERM GOAL #6   Title Pt will be able to use use R hand for self feeding for at least 25% of meal.   Status Achieved           OT Long  Term Goals - 11/13/15 1351    OT LONG TERM GOAL #1   Title Pt will be mod I with upgraded HEP - 12/11/2015   Status On-going   OT LONG TERM GOAL #2   Title Pt will improve grip strength by at least 8 pounds (baseline= 30 pounds)   Status On-going   OT LONG TERM GOAL #3   Title Pt will demostrate improved coordination as evidenced by decreasing time on 9 hole peg by 30 seconds (baseline=1.28.69)   Status On-going   OT LONG TERM GOAL #4   Title Pt will improve on box and blocks by at least 8 blocks (baseline=27)   Status On-going   OT LONG TERM GOAL #5   Title Pt will be able to lift 20 pounds using both hands   Status On-going   OT LONG TERM GOAL #6   Title Pt will be able to write one sentence legibly with AE prn   Status On-going   OT LONG TERM GOAL #7   Title Pt will be able to eat at least 50% of meal with R hand   Status On-going   OT LONG TERM GOAL #8   Title Pt will be able to simulate using R hand for placing objects in box/labeling as prep for return to work using both hands   Status New               Plan - 11/13/15 1358    Clinical Impression Statement Pt is progressing toward goals well. Pt states he is using his hand more at home.   Pt will benefit from skilled therapeutic intervention in order to improve on the following deficits (Retired) Decreased coordination;Decreased strength;Impaired UE functional use;Impaired tone   Rehab Potential Good   Clinical Impairments Affecting Rehab Potential increased time for new learning   OT Frequency 2x / week   OT Duration 8 weeks   OT Treatment/Interventions Self-care/ADL training;Electrical Stimulation;Therapeutic exercise;Neuromuscular education;DME and/or AE instruction;Manual Therapy;Therapeutic activities;Patient/family education   Plan check HEP given today, strengthening of R hand and wrist, coordination, functional use of R hand, writing.   Consulted and Agree with Plan of Care Patient        Problem  List Patient Active Problem List   Diagnosis Date Noted  . Visit for preventive health examination 11/02/2015  . Acute CVA (cerebrovascular accident) (HCC) 09/07/2015  . HLD (hyperlipidemia)   .  Pancytopenia (HCC) 09/06/2015  . 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 AKI (acute kidney injury) (HCC) 06/26/2015  . Adjustment disorder 06/26/2015  . HIV disease (HCC) 04/24/2015  . Enlarged thyroid 04/22/2015  . 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 CVA (cerebral infarction) 04/20/2015  . Essential hypertension 04/20/2015  . Hypertensive urgency 10/15/2014    Norton Pastel, OTR/L 11/13/2015, 2:00 PM  [facility] [address] [place], [place], [address] Phone: [phone]   Fax:  [phone]  Name: Renso Swett MRN: [id number] Date of Birth: April 21, 1946

## 2015-11-13 NOTE — Patient Instructions (Addendum)
Wrist exercises :  Do 1 time per day. Use a water bottle filled with water as your weight. Adjust the weight by the amount of water in the bottle. Go slow and do the ENTIRE range!  1. Sit with hand hanging over the edge of the table. Hold water bottle in right hand, palm facing down, Slowly pull wrist back in extension and then bend it toward the floor. Do 12, rest then do 12 more.  2. Sit with hand hanging over the edge of the table.  Hold water bottle in right hand, palm facing up. Slowly bend wrist up and then down.  Do 12, rest then do 12 more.  3. Sit with hand hanging over the edge of the table. Hold water bottle in right hand, with palm facing out and knuckle of thumb facing up (like you are holding a glass).  Use your left hand to stabilize your forearm.  Slowly bend the bottle up and toward you then return to starting position. Do 12, rest then do 12 more.  Make sure you rest enough in between so that you can do the entire 12 reps.    Also start practicing writing at sentence level (you have been practicing just writing your name).  Do one sentence frequently throughout the day  - do not try and sit down and write for extended periods of time as this will fatigue the hand too quickly.  PRINT AND THINK BIG LETTERS!!!!

## 2015-11-16 ENCOUNTER — Ambulatory Visit: Payer: Managed Care, Other (non HMO) | Admitting: Occupational Therapy

## 2015-11-16 DIAGNOSIS — IMO0002 Reserved for concepts with insufficient information to code with codable children: Secondary | ICD-10-CM

## 2015-11-16 DIAGNOSIS — G8191 Hemiplegia, unspecified affecting right dominant side: Secondary | ICD-10-CM | POA: Diagnosis not present

## 2015-11-16 DIAGNOSIS — M6281 Muscle weakness (generalized): Secondary | ICD-10-CM

## 2015-11-16 DIAGNOSIS — M6289 Other specified disorders of muscle: Secondary | ICD-10-CM

## 2015-11-16 NOTE — Therapy (Signed)
[facility] [address] [place], [place], [address] Phone: [phone]   Fax:  [phone]  Occupational Therapy Treatment  Patient Details  Name: Thomas [name] MRN: [id number] Date of Birth: 05-09-1946 Referring Provider: Dr. Gwynn Burly  Encounter Date: 11/16/2015      OT End of Session - 11/16/15 1556    Visit Number 7   Number of Visits 16   Date for OT Re-Evaluation 12/11/15   Authorization Type medicare - will need G code and PN every 10th visit   Authorization Time Period 60 days   Authorization - Visit Number 7   Authorization - Number of Visits 10   OT Start Time 1535   OT Stop Time 1615   OT Time Calculation (min) 40 min   Activity Tolerance Patient tolerated treatment well   Behavior During Therapy [facility] for tasks assessed/performed      Past Medical History  Diagnosis Date  . HTN (hypertension)   . Enlarged thyroid 04/22/2015    Seen on CTA 04/21/15: Contains multiple nodules measuring up to 2.1 cm in size, TSH wnl.  Needs thyroid ultrasound and biopsy  . 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  Recommend 1 year follow up with CT or MRI.   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 Immune deficiency disorder (HCC)   . HIV (human immunodeficiency virus infection) (HCC)   . Heart murmur   . Stroke [facility])     Past Surgical History  Procedure Laterality Date  . No past surgeries      There were no vitals filed for this visit.  Visit Diagnosis:  Hemiplegia affecting right dominant side (HCC)  Muscle tone increased  Muscle weakness  Lack of coordination due to stroke      Subjective Assessment - 11/16/15 1540    Pertinent History see epic   Currently in Pain? No/denies         A/ROM wrist flexion/ extension palm down with 2 lbs weight, palm up with 1 lbs weight and ulnar / radial deviation with 2 lbs, for  2 sets of 10  reps Handwriting activities: digital finger control worksheet for improved right hand control in prep for writing, then pt wrote 3 sentences with good legibility and letter size. Red Putty exercises for increased RUE strength: composite grip, tip pinch then pulling pegs from putty, min v.c.                         OT Short Term Goals - 11/13/15 1351    OT SHORT TERM GOAL #1   Title Pt will be mod I with HEP - 11/13/2015   Status Achieved   OT SHORT TERM GOAL #2   Title Pt will improve grip strength by at least 5 pounds in RUE (baseline = 30)   Status Achieved   OT SHORT TERM GOAL #3   Title Pt will demonstrate improved coordination as evidenced by decreasing time on 9 hole peg by 20 seconds (baseline= 1.28.69   Status Achieved  on #3 slot   OT SHORT TERM GOAL #4   Title Pt will demonstrate ability for unilateral overhead reach with RUE to pick up 5 pound object x5 without dropping.   Status Achieved   OT SHORT TERM GOAL #5   Title Pt will demostrate ability to write name legibly with AE prn  Status Achieved   OT SHORT TERM GOAL #6   Title Pt will be able to use use R hand for self feeding for at least 25% of meal.   Status Achieved           OT Long Term Goals - 11/13/15 1351    OT LONG TERM GOAL #1   Title Pt will be mod I with upgraded HEP - 12/11/2015   Status On-going   OT LONG TERM GOAL #2   Title Pt will improve grip strength by at least 8 pounds (baseline= 30 pounds)   Status On-going   OT LONG TERM GOAL #3   Title Pt will demostrate improved coordination as evidenced by decreasing time on 9 hole peg by 30 seconds (baseline=1.28.69)   Status On-going   OT LONG TERM GOAL #4   Title Pt will improve on box and blocks by at least 8 blocks (baseline=27)   Status On-going   OT LONG TERM GOAL #5   Title Pt will be able to lift 20 pounds using both hands   Status On-going   OT LONG TERM GOAL #6   Title Pt will be able to write one sentence legibly with  AE prn   Status On-going   OT LONG TERM GOAL #7   Title Pt will be able to eat at least 50% of meal with R hand   Status On-going   OT LONG TERM GOAL #8   Title Pt will be able to simulate using R hand for placing objects in box/labeling as prep for return to work using both hands   Status New               Plan - 11/16/15 1541    Clinical Impression Statement Pt is progressing towards goals.He reports he has returned to work this week and is using RUE to assist with work activities.   Pt will benefit from skilled therapeutic intervention in order to improve on the following deficits (Retired) Decreased coordination;Decreased strength;Impaired UE functional use;Impaired tone   Rehab Potential Good   Clinical Impairments Affecting Rehab Potential increased time for new learning   OT Frequency 2x / week   OT Duration 8 weeks   OT Treatment/Interventions Self-care/ADL training;Electrical Stimulation;Therapeutic exercise;Neuromuscular education;DME and/or AE instruction;Manual Therapy;Therapeutic activities;Patient/family education   Plan functional use of R hand, writing   Consulted and Agree with Plan of Care Patient        Problem List Patient Active Problem List   Diagnosis Date Noted  . Visit for preventive health examination 11/02/2015  . Acute CVA (cerebrovascular accident) (HCC) 09/07/2015  . HLD (hyperlipidemia)   . Pancytopenia (HCC) 09/06/2015  . 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 AKI (acute kidney injury) (HCC) 06/26/2015  . Adjustment disorder 06/26/2015  . HIV disease (HCC) 04/24/2015  . Enlarged thyroid 04/22/2015  . 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 CVA (cerebral infarction) 04/20/2015  . Essential hypertension 04/20/2015  . Hypertensive urgency 10/15/2014    Thomas [name] 11/16/2015, 3:58 PM Keene BreathKathryn Dniyah Grant, OTR/L [phone] Phone: [phone] 4:02 PM 11/16/2015 [facility] [address] [place], [place], [address] Phone: [phone]   Fax:  [phone]  Name: Thomas [name] MRN: [id number] Date of Birth: 07/17/1946

## 2015-11-20 ENCOUNTER — Ambulatory Visit: Payer: Managed Care, Other (non HMO) | Admitting: Occupational Therapy

## 2015-11-20 ENCOUNTER — Encounter: Payer: Self-pay | Admitting: Occupational Therapy

## 2015-11-20 DIAGNOSIS — G8191 Hemiplegia, unspecified affecting right dominant side: Secondary | ICD-10-CM

## 2015-11-20 DIAGNOSIS — M6281 Muscle weakness (generalized): Secondary | ICD-10-CM

## 2015-11-20 DIAGNOSIS — IMO0002 Reserved for concepts with insufficient information to code with codable children: Secondary | ICD-10-CM

## 2015-11-20 DIAGNOSIS — M6289 Other specified disorders of muscle: Secondary | ICD-10-CM

## 2015-11-20 NOTE — Therapy (Signed)
[facility] [address] [place], [place], [address] Phone: [phone]   Fax:  [phone]  Occupational Therapy Treatment  Patient Details  Name: Thomas [name] MRN: [id number] Date of Birth: [birth year] Referring Provider: Dr. Gwynn Burly  Encounter Date: 11/20/2015      OT End of Session - 11/20/15 1515    Visit Number 8   Number of Visits 16   Date for OT Re-Evaluation 12/11/15   Authorization Type medicare - will need G code and PN every 10th visit   Authorization Time Period 60 days   Authorization - Visit Number 8   Authorization - Number of Visits 10   OT Start Time 1446   OT Stop Time 1513  pt had to leave early   OT Time Calculation (min) 27 min   Activity Tolerance Patient tolerated treatment well      Past Medical History  Diagnosis Date  . HTN (hypertension)   . Enlarged thyroid 04/22/2015    Seen on CTA 04/21/15: Contains multiple nodules measuring up to 2.1 cm in size, TSH wnl.  Needs thyroid ultrasound and biopsy  . 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  Recommend 1 year follow up with CT or MRI.   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 Immune deficiency disorder (HCC)   . HIV (human immunodeficiency virus infection) (HCC)   . Heart murmur   . Stroke [facility])     Past Surgical History  Procedure Laterality Date  . No past surgeries      There were no vitals filed for this visit.  Visit Diagnosis:  Hemiplegia affecting right dominant side (HCC)  Muscle tone increased  Muscle weakness  Lack of coordination due to stroke      Subjective Assessment - 11/20/15 1448    Subjective  Work is going pretty good.    Pertinent History see epic   Patient Stated Goals to get my arm and hand working good enough to go back to work   Currently in Pain? No/denies                      OT Treatments/Exercises (OP) -  11/20/15 0001    Fine Motor Coordination   In Hand Manipulation Training Therapeutic activities requiring in hand manipulation with emphasis on speed with accuracy. Pt improving in speed of activity however still delayed.    Hand Exercises   Other Hand Exercises Gripper with 30# of resistance. Pt with moderate difficulty and mod dropping, Pt needed increased time and rest breaks with this activity however abel to do increased resistance. Pt needs cues to maintain focus on keeping wrist stablized with activity - pt's wrist wants to flex and pronnate slightly due to weight of gripper, making use of gripper more difficult.  Addresed wrist extension and ulnar deviation using 2 pound weight with 15 reps x2.                    OT Short Term Goals - 11/20/15 1512    OT SHORT TERM GOAL #1   Title Pt will be mod I with HEP - 11/13/2015   Status Achieved   OT SHORT TERM GOAL #2   Title Pt will improve grip strength by at least 5 pounds in RUE (baseline = 30)   Status Achieved   OT SHORT TERM GOAL #3   Title  Pt will demonstrate improved coordination as evidenced by decreasing time on 9 hole peg by 20 seconds (baseline= 1.28.69   Status Achieved  on #3 slot   OT SHORT TERM GOAL #4   Title Pt will demonstrate ability for unilateral overhead reach with RUE to pick up 5 pound object x5 without dropping.   Status Achieved   OT SHORT TERM GOAL #5   Title Pt will demostrate ability to write name legibly with AE prn   Status Achieved   OT SHORT TERM GOAL #6   Title Pt will be able to use use R hand for self feeding for at least 25% of meal.   Status Achieved           OT Long Term Goals - 11/20/15 1513    OT LONG TERM GOAL #1   Title Pt will be mod I with upgraded HEP - 12/11/2015   Status On-going   OT LONG TERM GOAL #2   Title Pt will improve grip strength by at least 8 pounds (baseline= 30 pounds)   Status On-going   OT LONG TERM GOAL #3   Title Pt will demostrate improved  coordination as evidenced by decreasing time on 9 hole peg by 30 seconds (baseline=1.28.69)   Status On-going   OT LONG TERM GOAL #4   Title Pt will improve on box and blocks by at least 8 blocks (baseline=27)   Status On-going   OT LONG TERM GOAL #5   Title Pt will be able to lift 20 pounds using both hands   Status On-going   OT LONG TERM GOAL #6   Title Pt will be able to write one sentence legibly with AE prn   Status Achieved   OT LONG TERM GOAL #7   Title Pt will be able to eat at least 50% of meal with R hand   Status Achieved  Pt reports he eats about 90% of meal at this time   OT LONG TERM GOAL #8   Title Pt will be able to simulate using R hand for placing objects in box/labeling as prep for return to work using both hands   Status On-going               Plan - 11/20/15 1514    Clinical Impression Statement Pt making good progress toward goals. Pt states he uses his R hand so much more now that he has returned to work.    Pt will benefit from skilled therapeutic intervention in order to improve on the following deficits (Retired) Decreased coordination;Decreased strength;Impaired UE functional use;Impaired tone   Rehab Potential Good   Clinical Impairments Affecting Rehab Potential increased time for new learning   OT Frequency 2x / week   OT Duration 8 weeks   OT Treatment/Interventions Self-care/ADL training;Electrical Stimulation;Therapeutic exercise;Neuromuscular education;DME and/or AE instruction;Manual Therapy;Therapeutic activities;Patient/family education   Plan check grip strength and coordination, functional use of R hand, strengthening of wrist and hand   Consulted and Agree with Plan of Care Patient        Problem List Patient Active Problem List   Diagnosis Date Noted  . Visit for preventive health examination 11/02/2015  . Acute CVA (cerebrovascular accident) (HCC) 09/07/2015  . HLD (hyperlipidemia)   . Pancytopenia (HCC) 09/06/2015  .  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 AKI (acute kidney injury) (HCC) 06/26/2015  . Adjustment disorder 06/26/2015  . HIV disease (HCC) 04/24/2015  .  Enlarged thyroid 04/22/2015  . 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 CVA (cerebral infarction) 04/20/2015  . Essential hypertension 04/20/2015  . Hypertensive urgency 10/15/2014    Norton Pastel, OTR/L 11/20/2015, 3:17 PM  [place] [facility] [address] [place], [place], [address] Phone: [phone]   Fax:  [phone]  Name: Thomas [name] MRN: [id number] Date of Birth: 1945/12/02

## 2015-11-23 ENCOUNTER — Ambulatory Visit: Payer: Managed Care, Other (non HMO) | Admitting: Occupational Therapy

## 2015-11-23 ENCOUNTER — Encounter: Payer: Self-pay | Admitting: Occupational Therapy

## 2015-11-23 ENCOUNTER — Telehealth: Payer: Self-pay | Admitting: *Deleted

## 2015-11-23 DIAGNOSIS — G8191 Hemiplegia, unspecified affecting right dominant side: Secondary | ICD-10-CM | POA: Diagnosis not present

## 2015-11-23 DIAGNOSIS — M6289 Other specified disorders of muscle: Secondary | ICD-10-CM

## 2015-11-23 DIAGNOSIS — IMO0002 Reserved for concepts with insufficient information to code with codable children: Secondary | ICD-10-CM

## 2015-11-23 DIAGNOSIS — M6281 Muscle weakness (generalized): Secondary | ICD-10-CM

## 2015-11-23 NOTE — Telephone Encounter (Signed)
Spoke with patient and informed him,  per Dr Roda ShuttersXu, that the cardiac monitoring test done recently was negative for afib. Please continue current treatment. Patient verbalized understanding, appreciation.

## 2015-11-23 NOTE — Therapy (Signed)
[facility] [address] [place], [place], [address] Phone: [phone]   Fax:  [phone]  Occupational Therapy Treatment  Patient Details  Name: Thomas [name] MRN: [id number] Date of Birth: Mar 27, 1946 Referring Provider: Dr. Jule Ser  Encounter Date: 11/23/2015      OT End of Session - 11/23/15 1607    Visit Number 9   Number of Visits 16   Date for OT Re-Evaluation 12/11/15   Authorization Type medicare - will need G code and PN every 10th visit   Authorization Time Period 60 days   Authorization - Visit Number 9   Authorization - Number of Visits 10   OT Start Time 5035   OT Stop Time 1612   OT Time Calculation (min) 42 min   Activity Tolerance Patient tolerated treatment well      Past Medical History  Diagnosis Date  . HTN (hypertension)   . Enlarged thyroid 04/22/2015    Seen on CTA 04/21/15: Contains multiple nodules measuring up to 2.1 cm in size, TSH wnl.  Needs thyroid ultrasound and biopsy  . Ectatic thoracic aorta ([place]) 04/22/2015    Noted on CTA on 04/21/15.  Recommend 1 year follow up with CT or MRI.   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 Immune deficiency disorder ([place])   . HIV (human immunodeficiency virus infection) ([place])   . Heart murmur   . Stroke [facility])     Past Surgical History  Procedure Laterality Date  . No past surgeries      There were no vitals filed for this visit.  Visit Diagnosis:  Hemiplegia affecting right dominant side (HCC)  Muscle tone increased  Muscle weakness  Lack of coordination due to stroke      Subjective Assessment - 11/23/15 1535    Subjective  They haven't given me a job yet at work that I can't do   Pertinent History see epic   Patient Stated Goals to get my arm and hand working good enough to go back to work   Currently in Pain? No/denies                      OT Treatments/Exercises  (OP) - 11/23/15 0001    ADLs   ADL Comments Focused on functional use of R hand including opening new containers, writing at paragraph length, , placing labels on bottles, and bilateral lifting using both hands. Pt able to lift and carry container with 20 pounds of weight using both hands. Dynamomter reading only 33 pounds of grip strength however pt is able to open new food containers of varying sizes and reports there is no job at work that he hasn't been able to perform. Pt feels R hand is weaker but states it rarely inteferes functionally. Pt reports he is using R hand for 90-100% of his meals.  Pt scored 40 on Box and Blocks (baseline = 27)  and completed 9 hole peg test in 36.19 seconds (baseline = 1.28.69).  Pt reports he is writing at work with no difficulty as it is mostly short sentences, checks and numbers. Pt is able to write at paragraph level with 100% legibility however print is small. Pt reports that is how he wrote before and handwriting looks very similiar to prior to his stroke.                   OT Short Term  Goals - 11/23/15 1606    OT SHORT TERM GOAL #1   Title Pt will be mod I with HEP - 11/13/2015   Status Achieved   OT SHORT TERM GOAL #2   Title Pt will improve grip strength by at least 5 pounds in RUE (baseline = 30)   Status Achieved   OT SHORT TERM GOAL #3   Title Pt will demonstrate improved coordination as evidenced by decreasing time on 9 hole peg by 20 seconds (baseline= 1.28.69   Status Achieved  on #3 slot   OT SHORT TERM GOAL #4   Title Pt will demonstrate ability for unilateral overhead reach with RUE to pick up 5 pound object x5 without dropping.   Status Achieved   OT SHORT TERM GOAL #5   Title Pt will demostrate ability to write name legibly with AE prn   Status Achieved   OT SHORT TERM GOAL #6   Title Pt will be able to use use R hand for self feeding for at least 25% of meal.   Status Achieved           OT Long Term Goals - 11/23/15  1606    OT LONG TERM GOAL #1   Title Pt will be mod I with upgraded HEP - 12/11/2015   Status On-going   OT LONG TERM GOAL #2   Title Pt will improve grip strength by at least 8 pounds (baseline= 30 pounds)   Status On-going   OT LONG TERM GOAL #3   Title Pt will demostrate improved coordination as evidenced by decreasing time on 9 hole peg by 30 seconds (baseline=1.28.69)   Status Achieved  36.19   OT LONG TERM GOAL #4   Title Pt will improve on box and blocks by at least 8 blocks (baseline=27)   Status Achieved  40   OT LONG TERM GOAL #5   Title Pt will be able to lift 20 pounds using both hands   Status Achieved   OT LONG TERM GOAL #6   Title Pt will be able to write one sentence legibly with AE prn   Status Achieved   OT LONG TERM GOAL #7   Title Pt will be able to eat at least 50% of meal with R hand   Status Achieved  Pt reports he eats about 90% of meal at this time   OT LONG TERM GOAL #8   Title Pt will be able to simulate using R hand for placing objects in box/labeling as prep for return to work using both hands   Status Achieved               Plan - 11/23/15 1608    Clinical Impression Statement Pt has made excellent progress and has met all but 2 LTG.  Pt functionally demonstrates more grip strength than he can register on dynamometer.  Pt has returned to work full time and is using his hand a signifcant amount of time.   Pt will benefit from skilled therapeutic intervention in order to improve on the following deficits (Retired) Decreased coordination;Decreased strength;Impaired UE functional use;Impaired tone   Rehab Potential Good   Clinical Impairments Affecting Rehab Potential increased time for new learning   OT Frequency 2x / week   OT Duration 8 weeks   OT Treatment/Interventions Self-care/ADL training;Electrical Stimulation;Therapeutic exercise;Neuromuscular education;DME and/or AE instruction;Manual Therapy;Therapeutic activities;Patient/family  education   Plan strengthening and teach pt how to increase challenge of HEP; d/c next week  Consulted and Agree with Plan of Care Patient        Problem List Patient Active Problem List   Diagnosis Date Noted  . Visit for preventive health examination 11/02/2015  . Acute CVA (cerebrovascular accident) ([place]) 09/07/2015  . HLD (hyperlipidemia)   . Pancytopenia ([place]) 09/06/2015  . Cerebrovascular accident (CVA) due to thrombosis of left middle cerebral artery ([place]) 07/05/2015  . AKI (acute kidney injury) ([place]) 06/26/2015  . Adjustment disorder 06/26/2015  . HIV disease ([place]) 04/24/2015  . Enlarged thyroid 04/22/2015  . Ectatic thoracic aorta ([place]) 04/22/2015  . CVA (cerebral infarction) 04/20/2015  . Essential hypertension 04/20/2015  . Hypertensive urgency 10/15/2014    Quay Burow, OTR/L 11/23/2015, 4:16 PM  [facility] [address] [place], [place], [address] Phone: [phone]   Fax:  [phone]  Name: Thomas [name] MRN: [id number] Date of Birth: 06/17/1946

## 2015-11-27 ENCOUNTER — Encounter: Payer: Self-pay | Admitting: Occupational Therapy

## 2015-11-27 ENCOUNTER — Ambulatory Visit: Payer: Managed Care, Other (non HMO) | Admitting: Occupational Therapy

## 2015-11-27 DIAGNOSIS — G8191 Hemiplegia, unspecified affecting right dominant side: Secondary | ICD-10-CM | POA: Diagnosis not present

## 2015-11-27 DIAGNOSIS — M6289 Other specified disorders of muscle: Secondary | ICD-10-CM

## 2015-11-27 DIAGNOSIS — IMO0002 Reserved for concepts with insufficient information to code with codable children: Secondary | ICD-10-CM

## 2015-11-27 DIAGNOSIS — M6281 Muscle weakness (generalized): Secondary | ICD-10-CM

## 2015-11-27 NOTE — Therapy (Signed)
[facility] [address] [place], [place], [address] Phone: [phone]   Fax:  [phone]  Occupational Therapy Treatment  Patient Details  Name: Thomas [name] MRN: [id number] Date of Birth: 09/20/1945 Referring Provider: Dr. Gwynn BurlyAndrew Wallace  Encounter Date: 11/27/2015      OT End of Session - 11/27/15 1347    Visit Number 10   Number of Visits 16   Date for OT Re-Evaluation 12/11/15   Authorization Type medicare - will need G code and PN every 10th visit   Authorization Time Period 60 days   Authorization - Visit Number 10   Authorization - Number of Visits 10   OT Start Time 1316   OT Stop Time 1358   OT Time Calculation (min) 42 min   Activity Tolerance Patient tolerated treatment well      Past Medical History  Diagnosis Date  . HTN (hypertension)   . Enlarged thyroid 04/22/2015    Seen on CTA 04/21/15: Contains multiple nodules measuring up to 2.1 cm in size, TSH wnl.  Needs thyroid ultrasound and biopsy  . 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  Recommend 1 year follow up with CT or MRI.   Marland Kitchen. 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 Immune deficiency disorder (HCC)   . HIV (human immunodeficiency virus infection) (HCC)   . Heart murmur   . Stroke [facility](HCC)     Past Surgical History  Procedure Laterality Date  . No past surgeries      There were no vitals filed for this visit.  Visit Diagnosis:  Hemiplegia affecting right dominant side (HCC)  Muscle tone increased  Muscle weakness  Lack of coordination due to stroke      Subjective Assessment - 11/27/15 1322    Subjective  Work is going really well and my hand seems to just keep getting better.   Pertinent History see epic   Patient Stated Goals to get my arm and hand working good enough to go back to work   Currently in Pain? No/denies                      OT  Treatments/Exercises (OP) - 11/27/15 0001    Exercises   Exercises Wrist   Wrist Exercises   Other wrist exercises Wrist extension and radial deviation against gravity with 2 ound weight 20 reps x2.    Additional Wrist Exercises   Theraputty Flatten;Roll;Grip  using green putty (pt uses red for HEP)   Theraputty - Locate Pegs Locate 20 pegs in blue putty and place in board. Pt with moderate difficulty but able to complete with extra time.                   OT Short Term Goals - 11/27/15 1340    OT SHORT TERM GOAL #1   Title Pt will be mod I with HEP - 11/13/2015   Status Achieved   OT SHORT TERM GOAL #2   Title Pt will improve grip strength by at least 5 pounds in RUE (baseline = 30)   Status Achieved   OT SHORT TERM GOAL #3   Title Pt will demonstrate improved coordination as evidenced by decreasing time on 9 hole peg by 20 seconds (baseline= 1.28.69   Status Achieved  on #3 slot   OT SHORT TERM GOAL #4   Title Pt will demonstrate  ability for unilateral overhead reach with RUE to pick up 5 pound object x5 without dropping.   Status Achieved   OT SHORT TERM GOAL #5   Title Pt will demostrate ability to write name legibly with AE prn   Status Achieved   OT SHORT TERM GOAL #6   Title Pt will be able to use use R hand for self feeding for at least 25% of meal.   Status Achieved           OT Long Term Goals - December 18, 2015 1340    OT LONG TERM GOAL #1   Title Pt will be mod I with upgraded HEP - 12/11/2015   Status On-going   OT LONG TERM GOAL #2   Title Pt will improve grip strength by at least 8 pounds (baseline= 30 pounds)   Status On-going   OT LONG TERM GOAL #3   Title Pt will demostrate improved coordination as evidenced by decreasing time on 9 hole peg by 30 seconds (baseline=1.28.69)   Status Achieved  36.19   OT LONG TERM GOAL #4   Title Pt will improve on box and blocks by at least 8 blocks (baseline=27)   Status Achieved  40   OT LONG TERM GOAL #5    Title Pt will be able to lift 20 pounds using both hands   Status Achieved   OT LONG TERM GOAL #6   Title Pt will be able to write one sentence legibly with AE prn   Status Achieved   OT LONG TERM GOAL #7   Title Pt will be able to eat at least 50% of meal with R hand   Status Achieved  Pt reports he eats about 90% of meal at this time   OT LONG TERM GOAL #8   Title Pt will be able to simulate using R hand for placing objects in box/labeling as prep for return to work using both hands   Status Achieved               Plan - 12/18/15 1340    Clinical Impression Statement Pt  continues to make functional progress with R hand and reports continued success at work.   Pt will benefit from skilled therapeutic intervention in order to improve on the following deficits (Retired) Decreased coordination;Decreased strength;Impaired UE functional use;Impaired tone   Rehab Potential Good   Clinical Impairments Affecting Rehab Potential increased time for new learning   OT Frequency 2x / week   OT Duration 8 weeks   OT Treatment/Interventions Self-care/ADL training;Electrical Stimulation;Therapeutic exercise;Neuromuscular education;DME and/or AE instruction;Manual Therapy;Therapeutic activities;Patient/family education   Plan teach pt how to upgrade HEP, strengthening in preparation for d/c          G-Codes - 12-18-15 1344    Functional Assessment Tool Used 9 hole peg, dynamometer, box and blocks   Functional Limitation Carrying, moving and handling objects   Carrying, Moving and Handling Objects Current Status (R6045) At least 40 percent but less than 60 percent impaired, limited or restricted   Carrying, Moving and Handling Objects Goal Status (W0981) At least 40 percent but less than 60 percent impaired, limited or restricted      Problem List Patient Active Problem List   Diagnosis Date Noted  . Visit for preventive health examination 11/02/2015  . Acute CVA (cerebrovascular  accident) (HCC) 09/07/2015  . HLD (hyperlipidemia)   . Pancytopenia (HCC) 09/06/2015  . Cerebrovascular accident (CVA) due to thrombosis of left middle cerebral artery (  HCC) 07/05/2015  . AKI (acute kidney injury) (HCC) 06/26/2015  . Adjustment disorder 06/26/2015  . HIV disease (HCC) 04/24/2015  . Enlarged thyroid 04/22/2015  . 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 CVA (cerebral infarction) 04/20/2015  . Essential hypertension 04/20/2015  . Hypertensive urgency 10/15/2014   Occupational Therapy Progress Note  Dates of Reporting Period: 10/16/2015 to 11/27/2015  Objective Reports of Subjective Statement: see above  Objective Measurements: see above  Goal Update: see above  Plan: see above  Reason Skilled Services are Required: see above  Norton Pastel, OTR/L 11/27/2015, 1:48 PM  [facility] [address] [place], [place], [address] Phone: [phone]   Fax:  [phone]  Name: Thomas [name] MRN: [id number] Date of Birth: 1946-08-27

## 2015-11-30 ENCOUNTER — Ambulatory Visit: Payer: Managed Care, Other (non HMO) | Admitting: Occupational Therapy

## 2015-11-30 ENCOUNTER — Encounter: Payer: Self-pay | Admitting: Occupational Therapy

## 2015-11-30 DIAGNOSIS — IMO0002 Reserved for concepts with insufficient information to code with codable children: Secondary | ICD-10-CM

## 2015-11-30 DIAGNOSIS — M6289 Other specified disorders of muscle: Secondary | ICD-10-CM

## 2015-11-30 DIAGNOSIS — G8191 Hemiplegia, unspecified affecting right dominant side: Secondary | ICD-10-CM | POA: Diagnosis not present

## 2015-11-30 DIAGNOSIS — M6281 Muscle weakness (generalized): Secondary | ICD-10-CM

## 2015-11-30 NOTE — Patient Instructions (Signed)
How to increase the challenge of your home program for your right hand:  If your hand gets sore, just cut back the next few days and then try again. Muscle soreness is not uncommon but you don't want joint pain!!    Make changes SLOWLY. If you change up too many things at once and have a problem you won't know what caused the problem.   Putty: 1. Increase the number of repetitions for each exercise using the red putty. 2. Increase the number of times over the course of week that you do the program (for example if you are doing it 1 time per day , try doing 2 times a day a few days of the week). 3. SLOWLY transition to the green putty.  As you transition you will be doing part of the program with the red and part of the program with the green.  Start with the activities that focus on grip strength - making the ball, squeeze the fat hot dog.   Fine motor coordination: 1. Increase the speed of the activity while maintaining the accuracy. 2. Increase the amount of time you spend doing the program 3. Increase the number of times you do the program over the course of a week (see #2 above for example).

## 2015-11-30 NOTE — Therapy (Signed)
[facility] [address] [place], [place], [address] Phone: [phone]   Fax:  [phone]  Occupational Therapy Treatment  Patient Details  Name: Thomas [name] MRN: [id number] Date of Birth: [birth year] Referring Provider: Dr. Jule Ser  Encounter Date: 11/30/2015      OT End of Session - 11/30/15 1346    Visit Number 11   Number of Visits 16   Date for OT Re-Evaluation 12/11/15   Authorization Type medicare - will need G code and PN every 10th visit   Authorization Time Period 60 days   OT Start Time 1315   OT Stop Time 1357   OT Time Calculation (min) 42 min   Activity Tolerance Patient tolerated treatment well      Past Medical History  Diagnosis Date  . HTN (hypertension)   . Enlarged thyroid 04/22/2015    Seen on CTA 04/21/15: Contains multiple nodules measuring up to 2.1 cm in size, TSH wnl.  Needs thyroid ultrasound and biopsy  . Ectatic thoracic aorta ([place]) 04/22/2015    Noted on CTA on 04/21/15.  Recommend 1 year follow up with CT or MRI.   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 Immune deficiency disorder ([place])   . HIV (human immunodeficiency virus infection) ([place])   . Heart murmur   . Stroke [facility])     Past Surgical History  Procedure Laterality Date  . No past surgeries      There were no vitals filed for this visit.  Visit Diagnosis:  Hemiplegia affecting right dominant side (HCC)  Muscle tone increased  Muscle weakness  Lack of coordination due to stroke      Subjective Assessment - 11/30/15 1320    Subjective  I am graduating today from therapy!   Pertinent History see epic   Patient Stated Goals to get my arm and hand working good enough to go back to work   Currently in Pain? No/denies                      OT Treatments/Exercises (OP) - 11/30/15 0001    Exercises   Exercises Wrist   Fine Motor Coordination   In Hand  Manipulation Training Addressed need for increased speed with accuracy with fine motor tasks - pt with signficant imrpovement in ability to manipulate and control small items with R hand however speed still slow. Focused on fine motor tasks with emphasis on speed while maintaining accuracy.  Discussed ways for pt to increase challenge at home as program gets easier - pt verbalized understanding.    Additional Wrist Exercises   Theraputty Roll;Grip  make a ball    Theraputty - Grip Pt issued green putty to allow for increase challenge in HEP per patient tolerance. Pt able to do 10 squeezes with green putty and make ball and hot dog in between with extra time and effort.  Discussed how to ramp up challenge of home program as program gets easier at home and hand becomes stronger. Pt verbalized understanding.    Hand Gripper with Small Beads Pt attempted 35# of resistance with gripper however stated he had used his hand a great deal at work today and it was fatigued.  Pt able to do 25# with no difficulty.                  OT Education - 11/30/15 1335    Education provided Yes  Education Details how to increase challenge of HEP for R hand   Person(s) Educated Patient   Methods Explanation;Handout   Comprehension Verbalized understanding          OT Short Term Goals - 11/30/15 1344    OT SHORT TERM GOAL #1   Title Pt will be mod I with HEP - 11/13/2015   Status Achieved   OT SHORT TERM GOAL #2   Title Pt will improve grip strength by at least 5 pounds in RUE (baseline = 30)   Status Achieved   OT SHORT TERM GOAL #3   Title Pt will demonstrate improved coordination as evidenced by decreasing time on 9 hole peg by 20 seconds (baseline= 1.28.69   Status Achieved  on #3 slot   OT SHORT TERM GOAL #4   Title Pt will demonstrate ability for unilateral overhead reach with RUE to pick up 5 pound object x5 without dropping.   Status Achieved   OT SHORT TERM GOAL #5   Title Pt will  demostrate ability to write name legibly with AE prn   Status Achieved   OT SHORT TERM GOAL #6   Title Pt will be able to use use R hand for self feeding for at least 25% of meal.   Status Achieved           OT Long Term Goals - 11/30/15 1344    OT LONG TERM GOAL #1   Title Pt will be mod I with upgraded HEP - 12/11/2015   Status On-going   OT LONG TERM GOAL #2   Title Pt will improve grip strength by at least 8 pounds (baseline= 30 pounds)   Status Partially Met  36 pounds - functionally patient reports he can do all activties at work and home and uses R hand as dominant 95% of the time.    OT LONG TERM GOAL #3   Title Pt will demostrate improved coordination as evidenced by decreasing time on 9 hole peg by 30 seconds (baseline=1.28.69)   Status Achieved  36.19   OT LONG TERM GOAL #4   Title Pt will improve on box and blocks by at least 8 blocks (baseline=27)   Status Achieved  40   OT LONG TERM GOAL #5   Title Pt will be able to lift 20 pounds using both hands   Status Achieved   OT LONG TERM GOAL #6   Title Pt will be able to write one sentence legibly with AE prn   Status Achieved   OT LONG TERM GOAL #7   Title Pt will be able to eat at least 50% of meal with R hand   Status Achieved  Pt reports he eats about 90% of meal at this time   OT LONG TERM GOAL #8   Title Pt will be able to simulate using R hand for placing objects in box/labeling as prep for return to work using both hands   Status Achieved               Plan - 11/30/15 1345    Clinical Impression Statement Pt has made excellent progress and has met all STG's and met or partially met all LTG"s.  Pt has returned to work and states he can do all tasks at work without problems. Pt ready for discharge.    Pt will benefit from skilled therapeutic intervention in order to improve on the following deficits (Retired) Decreased coordination;Decreased strength;Impaired UE functional use;Impaired tone  Rehab  Potential Good   Clinical Impairments Affecting Rehab Potential increased time for new learning   OT Frequency 2x / week   OT Duration 8 weeks   OT Treatment/Interventions Self-care/ADL training;Electrical Stimulation;Therapeutic exercise;Neuromuscular education;DME and/or AE instruction;Manual Therapy;Therapeutic activities;Patient/family education   Plan d/ c from OT   Consulted and Agree with Plan of Care Patient        Problem List Patient Active Problem List   Diagnosis Date Noted  . Visit for preventive health examination 11/02/2015  . Acute CVA (cerebrovascular accident) ([place]) 09/07/2015  . HLD (hyperlipidemia)   . Pancytopenia ([place]) 09/06/2015  . Cerebrovascular accident (CVA) due to thrombosis of left middle cerebral artery ([place]) 07/05/2015  . AKI (acute kidney injury) ([place]) 06/26/2015  . Adjustment disorder 06/26/2015  . HIV disease ([place]) 04/24/2015  . Enlarged thyroid 04/22/2015  . Ectatic thoracic aorta ([place]) 04/22/2015  . CVA (cerebral infarction) 04/20/2015  . Essential hypertension 04/20/2015  . Hypertensive urgency 10/15/2014  OCCUPATIONAL THERAPY DISCHARGE SUMMARY  Visits from Start of Care: 11  Current functional level related to goals / functional outcomes: See aboe   Remaining deficits: Mild R weakness in hand, mild lack of coordination R hand    Education / Equipment: HEP Plan: Patient agrees to discharge.  Patient goals were met. Patient is being discharged due to meeting the stated rehab goals.  ?????      Quay Burow, OTR/L 11/30/2015, 1:52 PM  [facility] [address] [place], [place], [address] Phone: [phone]   Fax:  [phone]  Name: Thomas [name] MRN: [id number] Date of Birth: 05-12-1946

## 2015-12-04 ENCOUNTER — Encounter: Payer: Medicare Other | Admitting: Occupational Therapy

## 2015-12-07 ENCOUNTER — Encounter: Payer: Medicare Other | Admitting: Occupational Therapy

## 2015-12-11 ENCOUNTER — Encounter: Payer: Medicare Other | Admitting: Occupational Therapy

## 2015-12-14 ENCOUNTER — Encounter: Payer: Medicare Other | Admitting: Occupational Therapy

## 2015-12-18 ENCOUNTER — Ambulatory Visit: Payer: Managed Care, Other (non HMO) | Admitting: Occupational Therapy

## 2016-02-05 ENCOUNTER — Telehealth: Payer: Self-pay | Admitting: Internal Medicine

## 2016-02-05 NOTE — Telephone Encounter (Signed)
APT. REMINDER CALL, NO ANSWER, NO VOICEMAIL °

## 2016-02-06 ENCOUNTER — Ambulatory Visit (INDEPENDENT_AMBULATORY_CARE_PROVIDER_SITE_OTHER): Payer: Managed Care, Other (non HMO) | Admitting: Pulmonary Disease

## 2016-02-06 ENCOUNTER — Encounter: Payer: Self-pay | Admitting: Pulmonary Disease

## 2016-02-06 VITALS — BP 152/86 | HR 64 | Temp 98.4°F | Wt 149.4 lb

## 2016-02-06 DIAGNOSIS — I63312 Cerebral infarction due to thrombosis of left middle cerebral artery: Secondary | ICD-10-CM

## 2016-02-06 DIAGNOSIS — Z21 Asymptomatic human immunodeficiency virus [HIV] infection status: Secondary | ICD-10-CM | POA: Diagnosis not present

## 2016-02-06 DIAGNOSIS — Z1211 Encounter for screening for malignant neoplasm of colon: Secondary | ICD-10-CM

## 2016-02-06 DIAGNOSIS — Z79899 Other long term (current) drug therapy: Secondary | ICD-10-CM | POA: Diagnosis not present

## 2016-02-06 DIAGNOSIS — I10 Essential (primary) hypertension: Secondary | ICD-10-CM | POA: Diagnosis not present

## 2016-02-06 DIAGNOSIS — B20 Human immunodeficiency virus [HIV] disease: Secondary | ICD-10-CM

## 2016-02-06 DIAGNOSIS — Z0000 Encounter for general adult medical examination without abnormal findings: Secondary | ICD-10-CM

## 2016-02-06 DIAGNOSIS — Z8673 Personal history of transient ischemic attack (TIA), and cerebral infarction without residual deficits: Secondary | ICD-10-CM | POA: Diagnosis not present

## 2016-02-06 MED ORDER — AMLODIPINE BESYLATE 5 MG PO TABS: 5.0000 mg | ORAL_TABLET | Freq: Every day | ORAL | Status: DC

## 2016-02-06 NOTE — Progress Notes (Signed)
   Subjective:    Patient ID: Thomas [name], male    DOB: 06/15/1946, 70 y.o.   MRN: [id number]  HPI [name] is a 70 year old man with history of HTN, CVA, HIV presenting for follow up of HTN.  Was back at work. 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 Recently was told he was slow and employer is trying to encourage him to retire. Is not required to do any heavy lifting.  Review of Systems Constitutional: no fevers/chills Eyes: no vision changes Ears, nose, mouth, throat, and face: no cough Respiratory: no shortness of breath Cardiovascular: no chest pain Gastrointestinal: no nausea/vomiting, no abdominal pain, no constipation, no diarrhea  Past Medical History  Diagnosis Date  . HTN (hypertension)   . Enlarged thyroid 04/22/2015    Seen on CTA 04/21/15: Contains multiple nodules measuring up to 2.1 cm in size, TSH wnl.  Needs thyroid ultrasound and biopsy  . 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  Recommend 1 year follow up with CT or MRI.   Marland Kitchen. 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 Immune deficiency disorder (HCC)   . HIV (human immunodeficiency virus infection) (HCC)   . Heart murmur   . Stroke [facility](HCC)     Current Outpatient Prescriptions on File Prior to Visit  Medication Sig Dispense Refill  . aspirin EC 325 MG tablet Take 1 tablet (325 mg total) by mouth daily. 30 tablet 0  . atorvastatin (LIPITOR) 40 MG tablet TAKE ONE TABLET BY MOUTH ONCE DAILY AT  6PM 30 tablet 11  . dolutegravir (TIVICAY) 50 MG tablet Take 1 tablet (50 mg total) by mouth daily. 30 tablet 5  . emtricitabine-tenofovir AF (DESCOVY) 200-25 MG tablet Take 1 tablet by mouth daily. 30 tablet 5  . lisinopril-hydrochlorothiazide (PRINZIDE,ZESTORETIC) 20-12.5 MG tablet Take 2 tablets by mouth daily. 60 tablet 5  . ondansetron (ZOFRAN) 4 MG tablet Take 1 tablet (4 mg total) by mouth every 8 (eight) hours as needed for nausea or vomiting. 11  tablet 0  . sulfamethoxazole-trimethoprim (BACTRIM DS,SEPTRA DS) 800-160 MG tablet Take 1 tablet by mouth daily. 30 tablet 5   No current facility-administered medications on file prior to visit.      Objective:   Physical Exam  Blood pressure 140/83, pulse 64, temperature 98.4 F (36.9 C), temperature source Oral, weight 149 lb 6.4 oz (67.767 kg), SpO2 100 %. General Apperance: NAD HEENT: Normocephalic, atraumatic, anicteric sclera Neck: Supple, trachea midline Lungs: Clear to auscultation bilaterally. No wheezes, rhonchi or rales. Breathing comfortably Heart: Regular rate and rhythm, no murmur/rub/gallop Abdomen: Soft, nontender, nondistended, no rebound/guarding Extremities: Warm and well perfused, no edema Skin: No rashes or lesions Neurologic: Alert and interactive. No gross deficits.    Assessment & Plan:  Please refer to problem based charting.

## 2016-02-06 NOTE — Patient Instructions (Signed)
Please make an appointment to follow up with Dr. Luciana Axeomer.  Take amlodipine daily for your blood pressure Continue to take lisinopril-hydrochlorothiazide two tablets daily  Follow up with internal medicine clinic in 3 months.

## 2016-02-07 NOTE — Assessment & Plan Note (Signed)
Encouraged patient to make follow up appointment with [facility]

## 2016-02-07 NOTE — Assessment & Plan Note (Signed)
Assessment: BP not at goal - 140/83 today  Plan:  Continue lisinopril-HCTZ 40-25mg  daily Add amlodipine 5mg  daily Follow up in 2-3 months

## 2016-02-07 NOTE — Assessment & Plan Note (Addendum)
Stool cards provided for colon CA screening

## 2016-02-07 NOTE — Assessment & Plan Note (Signed)
Doing well after CVA.  Letter provided for no work restrictions at this time.

## 2016-02-08 NOTE — Progress Notes (Signed)
Internal Medicine Clinic Attending  Case discussed with Dr. Krall at the time of the visit.  We reviewed the resident's history and exam and pertinent patient test results.  I agree with the assessment, diagnosis, and plan of care documented in the resident's note.  

## 2016-02-23 ENCOUNTER — Other Ambulatory Visit: Payer: Self-pay | Admitting: Internal Medicine

## 2016-03-08 ENCOUNTER — Other Ambulatory Visit: Payer: Self-pay | Admitting: Internal Medicine

## 2016-05-02 ENCOUNTER — Other Ambulatory Visit: Payer: Self-pay | Admitting: *Deleted

## 2016-05-02 MED ORDER — AMLODIPINE BESYLATE 5 MG PO TABS: 5.0000 mg | ORAL_TABLET | Freq: Every day | ORAL | 2 refills | Status: DC

## 2016-05-09 ENCOUNTER — Ambulatory Visit (INDEPENDENT_AMBULATORY_CARE_PROVIDER_SITE_OTHER): Payer: Managed Care, Other (non HMO) | Admitting: Neurology

## 2016-05-09 ENCOUNTER — Encounter: Payer: Self-pay | Admitting: Neurology

## 2016-05-09 VITALS — BP 123/78 | Ht 69.0 in | Wt 144.6 lb

## 2016-05-09 DIAGNOSIS — E785 Hyperlipidemia, unspecified: Secondary | ICD-10-CM

## 2016-05-09 DIAGNOSIS — B20 Human immunodeficiency virus [HIV] disease: Secondary | ICD-10-CM

## 2016-05-09 DIAGNOSIS — I63312 Cerebral infarction due to thrombosis of left middle cerebral artery: Secondary | ICD-10-CM | POA: Diagnosis not present

## 2016-05-09 DIAGNOSIS — I671 Cerebral aneurysm, nonruptured: Secondary | ICD-10-CM | POA: Diagnosis not present

## 2016-05-09 DIAGNOSIS — I10 Essential (primary) hypertension: Secondary | ICD-10-CM | POA: Diagnosis not present

## 2016-05-09 DIAGNOSIS — I639 Cerebral infarction, unspecified: Secondary | ICD-10-CM

## 2016-05-09 NOTE — Patient Instructions (Addendum)
-   continue ASA and lipitor for stroke prevention - follow up ID and IM for HTN and HIV management. Compliant with medication. - check BP at home and record - Follow up with your primary care physician for stroke risk factor modification. Recommend maintain blood pressure goal <130/80, diabetes with hemoglobin A1c goal below 6.5% and lipids with LDL cholesterol goal below 70 mg/dL.  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

## 2016-05-09 NOTE — Progress Notes (Signed)
STROKE NEUROLOGY FOLLOW UP NOTE  NAME: Thomas [name] DOB: [birth year]  REASON FOR VISIT: stroke follow up HISTORY FROM: pt and chart  Today we had Thomas pleasure of seeing Thomas [name] in follow-up at our Neurology Clinic. Pt was accompanied by no one.   History Summary Thomas [name] is a 70 y.o. male with history of hypertension was admitted on 04/20/15 for right facial droop and slurred speech. 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 CTA showed no large vessel occlusion but left 4mm PCOM aneurysm. TTE showed EF 50-55%. LDL 79 and A1c 6.0. 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 RPR was negative. Thomas [name] was put on ASA and lipitor and discharged with outpt follow up  07/05/15 follow up - Thomas [name] has been doing well from stroke standpoint. No recurrent stroke like symptoms. Thomas [name] follows with PCP for HTN and increased dose of lisinopril/HCTZ, currently in good control. Today BP 128/85. Thomas [name] also follows with ID Dr. Luciana Axe and is about to start HARRT therapy pending insurance approval. Thomas [name] Cre also improved well. No other complains.   09/06/15 Admission - pt was admitted again for right arm weakness and tingling. MRI showed right temporal horn WM punctate infarct, not corresponding to Thomas [name] symptoms. Thomas [name] symptoms quickly resolved and Thomas [name] was out of HAART meds at that time with WBC drooping from 3.4 to 1.9 and increased viral load. Therefore, Thomas [name] new right temporal horn WM punctate infarct likely to be incidental finding related to HIV not on HAART, and Thomas [name] symptoms likely due to recrudescence of old left BG/CR infarct on 04/20/15. Thomas [name] CUS, TTE, LDL all negative. Thomas [name] was continued on ASA and lipitor on discharge.   11/07/15 follow up - Pt has been doing well. Taking HARRT meds as instructed and following up with ID. Repeat viral load much improved. Had fine needle aspiration in 10/2015 and pathology showed benign thyroid nodule.  30 day cardiac monitor has been done and pending result. Pt BP at home controlled well 110-130. Today 142/87. Thomas [name] is actually taking ASA 81mg  at home.  Interval History During Thomas interval time, pt has been doing well. No stroke like symptoms and no complains. BP controls well at home and today in clinic 123/78. Thomas [name] stated compliance with medication.   REVIEW OF SYSTEMS: Full 14 system review of systems performed and notable only for those listed below and in HPI above, all others are negative:  Constitutional:   Cardiovascular:  Ear/Nose/Throat:   Skin:  Eyes:   Respiratory:   Gastroitestinal:   Genitourinary:  Hematology/Lymphatic:   Endocrine:  Musculoskeletal:   Allergy/Immunology:   Neurological:   Psychiatric:  Sleep:   Thomas following represents Thomas [name]'s updated allergies and side effects list: No Known Allergies  Thomas neurologically relevant items on Thomas [name]'s problem list were reviewed on today's visit.  Neurologic Examination  A problem focused neurological exam (12 or more points of Thomas single system neurologic examination, vital signs counts as 1 point, cranial nerves count for 8 points) was performed.  Blood pressure 123/78, height 5\' 9"  (1.753 m), weight 144 lb 9.6 oz (65.6 kg).  General - Well nourished, well developed, in no apparent distress.  Ophthalmologic - Sharp disc margins OU.   Cardiovascular - Regular rate and rhythm with no murmur.  Mental Status -  Level of arousal and orientation to time, place, and person were intact. Language including expression, naming, repetition, comprehension was  assessed and found intact. Fund of Knowledge was assessed and was intact.  Cranial Nerves II - XII - II - Visual field intact OU. III, IV, VI - Extraocular movements intact. V - Facial sensation intact bilaterally. VII - right mild flattening of nasolabial fold. VIII - Hearing & vestibular intact bilaterally. X - Palate elevates symmetrically.Marland Kitchen XI -  Chin turning & shoulder shrug intact bilaterally XII - Tongue protrusion intact.  Motor Strength - Thomas [name]'s strength was normal in all extremities and pronator drift was absent. Bulk was normal and fasciculations were absent.  Motor Tone - Muscle tone was assessed at Thomas neck and appendages and was normal.  Reflexes - Thomas [name]'s reflexes were 1+ in all extremities and Thomas [name] had no pathological reflexes.  Sensory - Light touch, temperature/pinprick were assessed and were symmetrical.   Coordination - Thomas [name] had normal movements in Thomas hands and feet with no ataxia or dysmetria. Tremor was absent.  Gait and Station - Thomas [name]'s transfers, posture, gait, station, and turns were observed as normal.  Data reviewed: I personally reviewed Thomas images and agree with Thomas radiology interpretations.  Ct Angio Head W/cm &/or Wo Cm 04/20/2015  1. No intracranial medium or large vessel occlusion or stenosis.  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  3. No cervical carotid or vertebral artery stenosis.  4. Ectatic aortic arch. Recommend annual imaging followup by CTA or MRA.  Dg Chest 2 View 04/20/2015  Borderline cardiac enlargement and probable mild bronchitic lung changes but no acute pulmonary findings.   Ct Head Wo Contrast 04/20/2015  Patchy supratentorial small vessel disease.  There is a degree of increased attenuation in a portion of Thomas left middle cerebral artery compared to Thomas right.  Thomas significance of this finding is uncertain.  This finding potentially could indicate Thomas earliest changes of a left middle cerebral artery distribution infarct.  No hemorrhage or mass effect.  Areas of paranasal sinus disease.   Mr Brain Wo Contrast 04/20/2015  Moderate-sized area of restricted diffusion affects Thomas LEFT centrum semiovale, perhaps involving Thomas superior portion of Thomas LEFT lentiform nucleus, consistent with an acute LEFT  MCA lenticulostriate territory infarct.  No hemorrhagic transformation.  No visible large vessel occlusion as correlated with recent CTA and based on intracranial flow voids.  Sequelae of hypertensive cerebral vascular disease, with multiple areas of chronic hemorrhage in Thomas brain as well as moderately advanced small vessel disease.   2D echo 04/22/15 - Left ventricle: Thomas cavity size was normal. Wall thickness was increased in a pattern of moderate LVH. Systolic function was normal. Thomas estimated ejection fraction was in Thomas range of 50% to 55%. Wall motion was normal; there were no regional wall motion abnormalities. Doppler parameters are consistent with abnormal left ventricular relaxation (grade 1 diastolic dysfunction). - Aortic valve: There was mild regurgitation. - Left atrium: Thomas atrium was moderately dilated. - Right atrium: Thomas atrium was moderately dilated. Impressions: - Normal LV systolic function; grade 1 diastolic dysfunction; moderate biatrial enlargement; calcified aortic valve with mild eccentric AI; trace MR and TR.  Ct Head Wo Contrast 09/06/2015 Mild chronic ischemic white matter disease. Old left periventricular white matter infarction and old infarction adjacent to right frontal horn infarction. No acute intracranial abnormality seen.   Mr Brain Wo Contrast 09/06/2015 1. Punctate nonhemorrhagic infarct along Thomas posterior right temporal and occipital lobe adjacent to Thomas ventricle. 2. Expected evolution of remote infarct involving Thomas left lentiform nucleus and corona radiata.  3. Multiple foci of remote hemorrhage involving Thomas thalami bilaterally as well as Thomas left temporal and occipital lobe. Remote hemorrhage in Thomas left paramedian pons. 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 Amyloid angiopathy is considered. 4. Mild stable sinus disease.   TTE 09/07/15 - Left ventricle: Thomas cavity size was normal. Wall thickness  wasincreased in a pattern of mild LVH. Systolic function was normal.Thomas estimated ejection fraction was in Thomas range of 50% to 55%.Wall motion was normal; there were no regional wall motionabnormalities. Doppler parameters are consistent with abnormalleft ventricular relaxation (grade 1 diastolic dysfunction). - Aortic valve: There was mild to moderate regurgitation. - Pulmonary arteries: Systolic pressure was mildly increased. PApeak pressure: 35 mm Hg (S). - Impressions: Normal LV systolic function; grade 1 diastolic dysfunction; mildLVH; sclerotic aortic valve with mild to moderate MR; mild TRwith mildly elevated pulmonary pressure.  Carotid Duplex - Mild intimal thickening without evidence of stenosis noted. Vertebral arteries - antegrade flow bilaterally.  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  30 day cardiac event monitoring - no afib   Component     Latest Ref Rng 04/20/2015 04/21/2015 04/26/2015 05/05/2015  Cholesterol     0 - 200 mg/dL  161115    Triglycerides     <150 mg/dL  36    HDL Cholesterol     >40 mg/dL  29 (L)    Total CHOL/HDL Ratio       4.0    VLDL     0 - 40 mg/dL  7    LDL (calc)     0 - 99 mg/dL  79    HIV 1 AB     Negative Positive (A)     HIV 2 AB     Negative Negative     Note      Comment     Hemoglobin A1C     4.8 - 5.6 %  6.0 (H)    Mean Plasma Glucose       126    HIV-1 RNA Viral Load         15710  HIV-1 RNA Viral Load Log         4.196  HIV     Non Reactive Comment     TSH     0.350 - 4.500 uIU/mL 1.496   0.823  RPR     Non Reactive   Non Reactive   T4, Total     4.5 - 12.0 ug/dL    8.8   Component     Latest Ref Rng 06/22/2015 09/07/2015 10/05/2015  Cholesterol     0 - 200 mg/dL  93   Triglycerides     <150 mg/dL  25   HDL Cholesterol     >40 mg/dL  30 (L)   Total CHOL/HDL Ratio       3.1   VLDL     0 - 40 mg/dL  5   LDL (calc)     0 - 99  mg/dL  58   HIV1 RNA # SerPl PCR        700  HIV1 RNA Plas PCR-Log#        2.845  Methodology        Comment  HIV 1 RNA Quant       119000   log10 HIV-1 RNA       5.076   CD4 T Cell Abs  400 - 2700 /uL  170 (L)   CD4 % Helper T Cell     33 - 55 %  13 (L)   Hemoglobin A1C     4.8 - 5.6 %  5.8 (H)   Mean Plasma Glucose       120   TSH     0.350 - 4.500 uIU/mL 0.646    RPR     Non Reactive  Non Reactive     Assessment: As you may recall, Thomas [name] is a 70 y.o. African American male with PMH of hypertension was admitted on 04/20/15 for left BG/CR infarct, size somehow larger than typical lacunar infarct. CTA showed no large vessel occlusion but left 4mm PCOM aneurysm. TTE showed EF 50-55%. LDL 79 and A1c 6.0. 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 RPR was negative. 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 Thomas [name] was put on ASA and lipitor. Thomas [name] was readmitted in 09/2015 for transient right arm weakness and numbness. However, MRI showed new right temporal horn WM punctate infarct, not correspondent to Thomas [name] symptoms which was likely due to recrudescence from old stroke as Thomas [name] was out of HAART meds with increased viral load and dropping WBC. Thomas [name] was restarted on HAART. During Thomas interval time, Thomas [name] has been doing well. Viral load much improved. Thomas [name] also follows ID for HARRT therapy. thyroid nodule pathology is benign. 30 day monitoring no afib. Continue ASA 325mg  and lipitor.  Plan:  - continue ASA and lipitor for stroke prevention - follow up ID and IM for HTN and HIV management. Compliant with medication. - check BP at home and record - Follow up with your primary care physician for stroke risk factor modification. Recommend maintain blood pressure goal <130/80, diabetes with hemoglobin A1c goal below 6.5% and lipids with LDL cholesterol goal below 70 mg/dL.  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   I  spent more than 25 minutes of face to face time with Thomas [name]. Greater than 50% of time was spent in counseling and coordination of care. We discussed about repeat CTA for aneurysm monitoring and compliance with medication, and follow up with ID.    Orders Placed This Encounter  Procedures  . CT ANGIO NECK W OR WO CONTRAST    Standing Status:   Future    Standing Expiration Date:   07/09/2017    Order Specific Question:   If indicated for Thomas ordered procedure, I authorize Thomas administration of contrast media per Radiology protocol    Answer:   Yes    Order Specific Question:   Reason for Exam (SYMPTOM  OR DIAGNOSIS REQUIRED)    Answer:   history of strokes and PCOM aneurysm    Order Specific Question:   Preferred imaging location?    Answer:   Internal  . CT ANGIO HEAD W OR WO CONTRAST    Standing Status:   Future    Standing Expiration Date:   07/09/2017    Order Specific Question:   If indicated for Thomas ordered procedure, I authorize Thomas administration of contrast media per Radiology protocol    Answer:   Yes    Order Specific Question:   Reason for Exam (SYMPTOM  OR DIAGNOSIS REQUIRED)    Answer:   left PCOM aneurysm follow up    Order Specific Question:   Preferred imaging location?  Answer:   Internal    No orders of Thomas defined types were placed in this encounter.   [name] Instructions  - continue ASA and lipitor for stroke prevention - follow up ID and IM for HTN and HIV management. Compliant with medication. - check BP at home and record - Follow up with your primary care physician for stroke risk factor modification. Recommend maintain blood pressure goal <130/80, diabetes with hemoglobin A1c goal below 6.5% and lipids with LDL cholesterol goal below 70 mg/dL.  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    Marvel Plan, MD PhD [facility] [address], [address] [place], [place] [address] [phone]

## 2016-05-21 ENCOUNTER — Inpatient Hospital Stay: Admission: RE | Admit: 2016-05-21 | Payer: Managed Care, Other (non HMO) | Source: Ambulatory Visit

## 2016-05-21 ENCOUNTER — Other Ambulatory Visit: Payer: Managed Care, Other (non HMO)

## 2016-07-04 ENCOUNTER — Encounter: Payer: Self-pay | Admitting: Internal Medicine

## 2016-07-08 ENCOUNTER — Ambulatory Visit: Payer: Medicare Other

## 2016-07-12 ENCOUNTER — Ambulatory Visit (INDEPENDENT_AMBULATORY_CARE_PROVIDER_SITE_OTHER): Payer: Medicare Other | Admitting: Pharmacist Clinician (PhC)/ Clinical Pharmacy Specialist

## 2016-07-12 DIAGNOSIS — B20 Human immunodeficiency virus [HIV] disease: Secondary | ICD-10-CM | POA: Diagnosis not present

## 2016-07-12 LAB — T-HELPER CELL (CD4) - (RCID CLINIC ONLY)
CD4 % Helper T Cell: 15 % — ABNORMAL LOW (ref 33–55)
CD4 T Cell Abs: 240 /uL — ABNORMAL LOW (ref 400–2700)

## 2016-07-12 LAB — BASIC METABOLIC PANEL
BUN: 19 mg/dL (ref 7–25)
CHLORIDE: 105 mmol/L (ref 98–110)
CO2: 30 mmol/L (ref 20–31)
Calcium: 9.7 mg/dL (ref 8.6–10.3)
Creat: 1.32 mg/dL — ABNORMAL HIGH (ref 0.70–1.18)
GLUCOSE: 79 mg/dL (ref 65–99)
POTASSIUM: 5.1 mmol/L (ref 3.5–5.3)
SODIUM: 142 mmol/L (ref 135–146)

## 2016-07-12 MED ORDER — EMTRICITABINE-TENOFOVIR AF 200-25 MG PO TABS: 1.0000 | ORAL_TABLET | Freq: Every day | ORAL | 5 refills | Status: DC

## 2016-07-12 MED ORDER — SULFAMETHOXAZOLE-TRIMETHOPRIM 800-160 MG PO TABS
1.0000 | ORAL_TABLET | Freq: Every day | ORAL | 5 refills | Status: DC
Start: 2016-07-12 — End: 2016-12-31

## 2016-07-12 MED ORDER — DOLUTEGRAVIR SODIUM 50 MG PO TABS: 50.0000 mg | ORAL_TABLET | Freq: Every day | ORAL | 5 refills | Status: DC

## 2016-07-12 NOTE — Addendum Note (Signed)
Addended by: Mariea ClontsGREEN, Min Collymore D on: 07/12/2016 10:34 AM   Modules accepted: Orders

## 2016-07-12 NOTE — Patient Instructions (Signed)
Cont to take Tivicay and Descovy We will do labs today Prescriptions have been sent to [facility]

## 2016-07-12 NOTE — Progress Notes (Signed)
Patient ID: Thomas [name], male   DOB: 1946-07-11, 70 y.o.   MRN: [id number] HPI: Thomas [name] is a 70 y.o. male who has not seen by [facility] in a while is here to discuss getting meds.   Allergies: No Known Allergies  Vitals:    Past Medical History: Past Medical History:  Diagnosis Date  . 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  Recommend 1 year follow up with CT or MRI.   . Enlarged thyroid 04/22/2015   Seen on CTA 04/21/15: Contains multiple nodules measuring up to 2.1 cm in size, TSH wnl.  Needs thyroid ultrasound and biopsy  . Heart murmur   . HIV (human immunodeficiency virus infection) ([place])   . HTN (hypertension)   . Immune deficiency disorder ([place])   . Stroke [facility])     Social History: Social History   Social History  . Marital status: Single    Spouse name: N/A  . Number of children: N/A  . Years of education: N/A   Social History Main Topics  . Smoking status: Never Smoker  . Smokeless tobacco: Never Used  . Alcohol use No  . Drug use: No  . Sexual activity: Not on file   Other Topics Concern  . Not on file   Social History Narrative  . No narrative on file    Previous Regimen:   Current Regimen: DTG/Desc  Labs: HIV 1 RNA Quant (copies/mL)  Date Value  09/07/2015 119,000   HIV-1 RNA Viral Load (copies/mL)  Date Value  05/05/2015 15,710  04/26/2015 CANCELED   CD4 T Cell Abs (/uL)  Date Value  09/07/2015 170 (L)  06/01/2015 190 (L)    CrCl: CrCl cannot be calculated (Patient's most recent lab result is older than the maximum 21 days allowed.).  Lipids:    Component Value Date/Time   CHOL 93 09/07/2015 0521   TRIG 25 09/07/2015 0521   HDL 30 (L) 09/07/2015 0521   CHOLHDL 3.1 09/07/2015 0521   VLDL 5 09/07/2015 0521   LDLCALC 58 09/07/2015 0521    Assessment: Thomas [name] was last seen by Dr. Linus Salmons in Feb. He was supposed to be f/u around  April or May but that didn't happen. I'm not sure if that appt was ever made because he stated that he didn't know about that appt. He was recently laid off from his job, therefore, he has lost insurance. Juliann Pulse pulled me in to explain his situation. He had to get a new part D plan but it will not kick in until Dec. He ran out of meds 5 days ago. We decided to give him 30 days supply of Tivicay and Descovy to carry him until Dec. He ran out of Septra also so I sent that rx to [facility] in [place] since it's on the $4 list.   He has not gotten any labs since Feb so I'll order labs while he is here. No record of HLA on profile so we'll get that today also. His ART has been sent to [facility] so they can mail them out to him in the future since he is in [place].   Recommendations:  Bmet, HIV VL with reflex to geno, CD4, HLA Rx [facility] 30 days supply of DTG/Descovy Septra sent to [facility] in [place] Appt made to see Dr. Linus Salmons in Dec  Nyliah Nierenberg Quang, PharmD, BCPS, AAHIVP, CPP Clinical Infectious  Disease [facility] 07/12/2016, 9:44 AM

## 2016-07-16 LAB — HIV-1 RNA ULTRAQUANT REFLEX TO GENTYP+: HIV-1 RNA Quant, Log: <1.3 Log copies/mL (ref <1.30)

## 2016-07-19 LAB — HLA B*5701: HLA-B 5701 W/RFLX HLA-B HIGH: NEGATIVE

## 2016-07-29 ENCOUNTER — Encounter: Payer: Self-pay | Admitting: Internal Medicine

## 2016-08-05 MED FILL — TIVICAY 50 MG TABLET: 50 | 30 days supply | Qty: 30 | Fill #0

## 2016-08-05 MED FILL — DESCOVY 200-25 MG TABS: 200-25 | 30 days supply | Qty: 30 | Fill #0

## 2016-08-08 ENCOUNTER — Ambulatory Visit (INDEPENDENT_AMBULATORY_CARE_PROVIDER_SITE_OTHER): Payer: Medicare Other | Admitting: Internal Medicine

## 2016-08-08 ENCOUNTER — Encounter: Payer: Self-pay | Admitting: Internal Medicine

## 2016-08-08 VITALS — BP 132/77 | HR 60 | Temp 97.6°F | Ht 69.0 in | Wt 155.0 lb

## 2016-08-08 DIAGNOSIS — I639 Cerebral infarction, unspecified: Secondary | ICD-10-CM | POA: Diagnosis not present

## 2016-08-08 DIAGNOSIS — N179 Acute kidney failure, unspecified: Secondary | ICD-10-CM

## 2016-08-08 DIAGNOSIS — B20 Human immunodeficiency virus [HIV] disease: Secondary | ICD-10-CM

## 2016-08-08 NOTE — Assessment & Plan Note (Signed)
Improved compared to previous.  No dose adjustment indicated

## 2016-08-08 NOTE — Progress Notes (Signed)
CC: Follow up for HIV  Interval history: Currently is asymptomatic and continues on Tivicay and Descovy.   Has no associated fatigue, no diarrhea, no weight loss.  Did miss about 5 days with a lapse of coverage with part D, now on and back on and coverage on and  Also has hyperlipidemia and is controlled with atorvastatin, high dose and hypertension on lisinopril and HCTZ.    Prior to Admission medications   Medication Sig Start Date End Date Taking? Authorizing Provider  aspirin 81 MG EC tablet Take 1 tablet (81 mg total) by mouth daily. 09/08/15  Yes Gwynn BurlyAndrew Wallace, DO  atorvastatin (LIPITOR) 40 MG tablet TAKE ONE TABLET BY MOUTH ONCE DAILY AT  6PM 09/08/15  Yes Gwynn BurlyAndrew Wallace, DO  dolutegravir (TIVICAY) 50 MG tablet Take 1 tablet (50 mg total) by mouth daily. 09/08/15  Yes Gwynn BurlyAndrew Wallace, DO  emtricitabine-tenofovir AF (DESCOVY) 200-25 MG tablet Take 1 tablet by mouth daily. 09/08/15  Yes Gwynn BurlyAndrew Wallace, DO  lisinopril-hydrochlorothiazide (PRINZIDE,ZESTORETIC) 20-12.5 MG tablet Take 2 tablets by mouth daily. 09/08/15  Yes Gwynn BurlyAndrew Wallace, DO  ondansetron (ZOFRAN) 4 MG tablet Take 1 tablet (4 mg total) by mouth every 8 (eight) hours as needed for nausea or vomiting. 06/22/15  Yes Courteney Lyn Mackuen, MD  sulfamethoxazole-trimethoprim (BACTRIM DS,SEPTRA DS) 800-160 MG tablet Take 1 tablet by mouth daily. 09/08/15  Yes Gwynn BurlyAndrew Wallace, DO    Review of Systems Constitutional: negative for fatigue and malaise Musculoskeletal: negative for myalgias and arthralgias All other systems reviewed and are negative   Physical Exam: CONSTITUTIONAL:in no apparent distress and alert  Vitals:   08/08/16 1040  BP: 132/77  Pulse: 60  Temp: 97.6 F (36.4 C)   Eyes: anicteric HENT: no thrush, no cervical lymphadenopathy Respiratory: Normal respiratory effort; CTA B Cardiovascular: RRR GI: soft, nt  Lab Results  Component Value Date   HIV1RNAQUANT <20 07/12/2016   HIV1RNAQUANT 119,000 09/07/2015

## 2016-08-08 NOTE — Assessment & Plan Note (Signed)
Doing well, cd4 over 200;  Will continue with bactrim for now but likely stop with next CD4 count.  Continue tivicay, descovy.  rtc 5 months

## 2016-08-12 ENCOUNTER — Other Ambulatory Visit: Payer: Self-pay | Admitting: Internal Medicine

## 2016-09-09 ENCOUNTER — Ambulatory Visit: Payer: Medicare Other

## 2016-09-11 ENCOUNTER — Other Ambulatory Visit: Payer: Self-pay | Admitting: Internal Medicine

## 2016-09-16 ENCOUNTER — Ambulatory Visit: Payer: Medicare Other

## 2016-10-02 MED FILL — TIVICAY 50 MG TABLET: 50 | 30 days supply | Qty: 30 | Fill #1 | Status: TO

## 2016-10-02 MED FILL — DESCOVY 200-25 MG TABS: 200-25 | 30 days supply | Qty: 30 | Fill #1 | Status: TO

## 2016-10-21 ENCOUNTER — Encounter: Payer: Self-pay | Admitting: Internal Medicine

## 2016-11-01 MED FILL — DESCOVY 200-25 MG TABS: 200-25 | 30 days supply | Qty: 30 | Fill #0

## 2016-11-01 MED FILL — TIVICAY 50 MG TABLET: 50 | 30 days supply | Qty: 30 | Fill #0

## 2016-11-13 ENCOUNTER — Other Ambulatory Visit: Payer: Self-pay | Admitting: Internal Medicine

## 2016-11-20 IMAGING — CR DG CHEST 2V
2 series · 2 of 2 positions shown · non-contrast
Comparison: None.

CLINICAL DATA: Code stroke.

EXAM:
CHEST  2 VIEW

[chest pa]
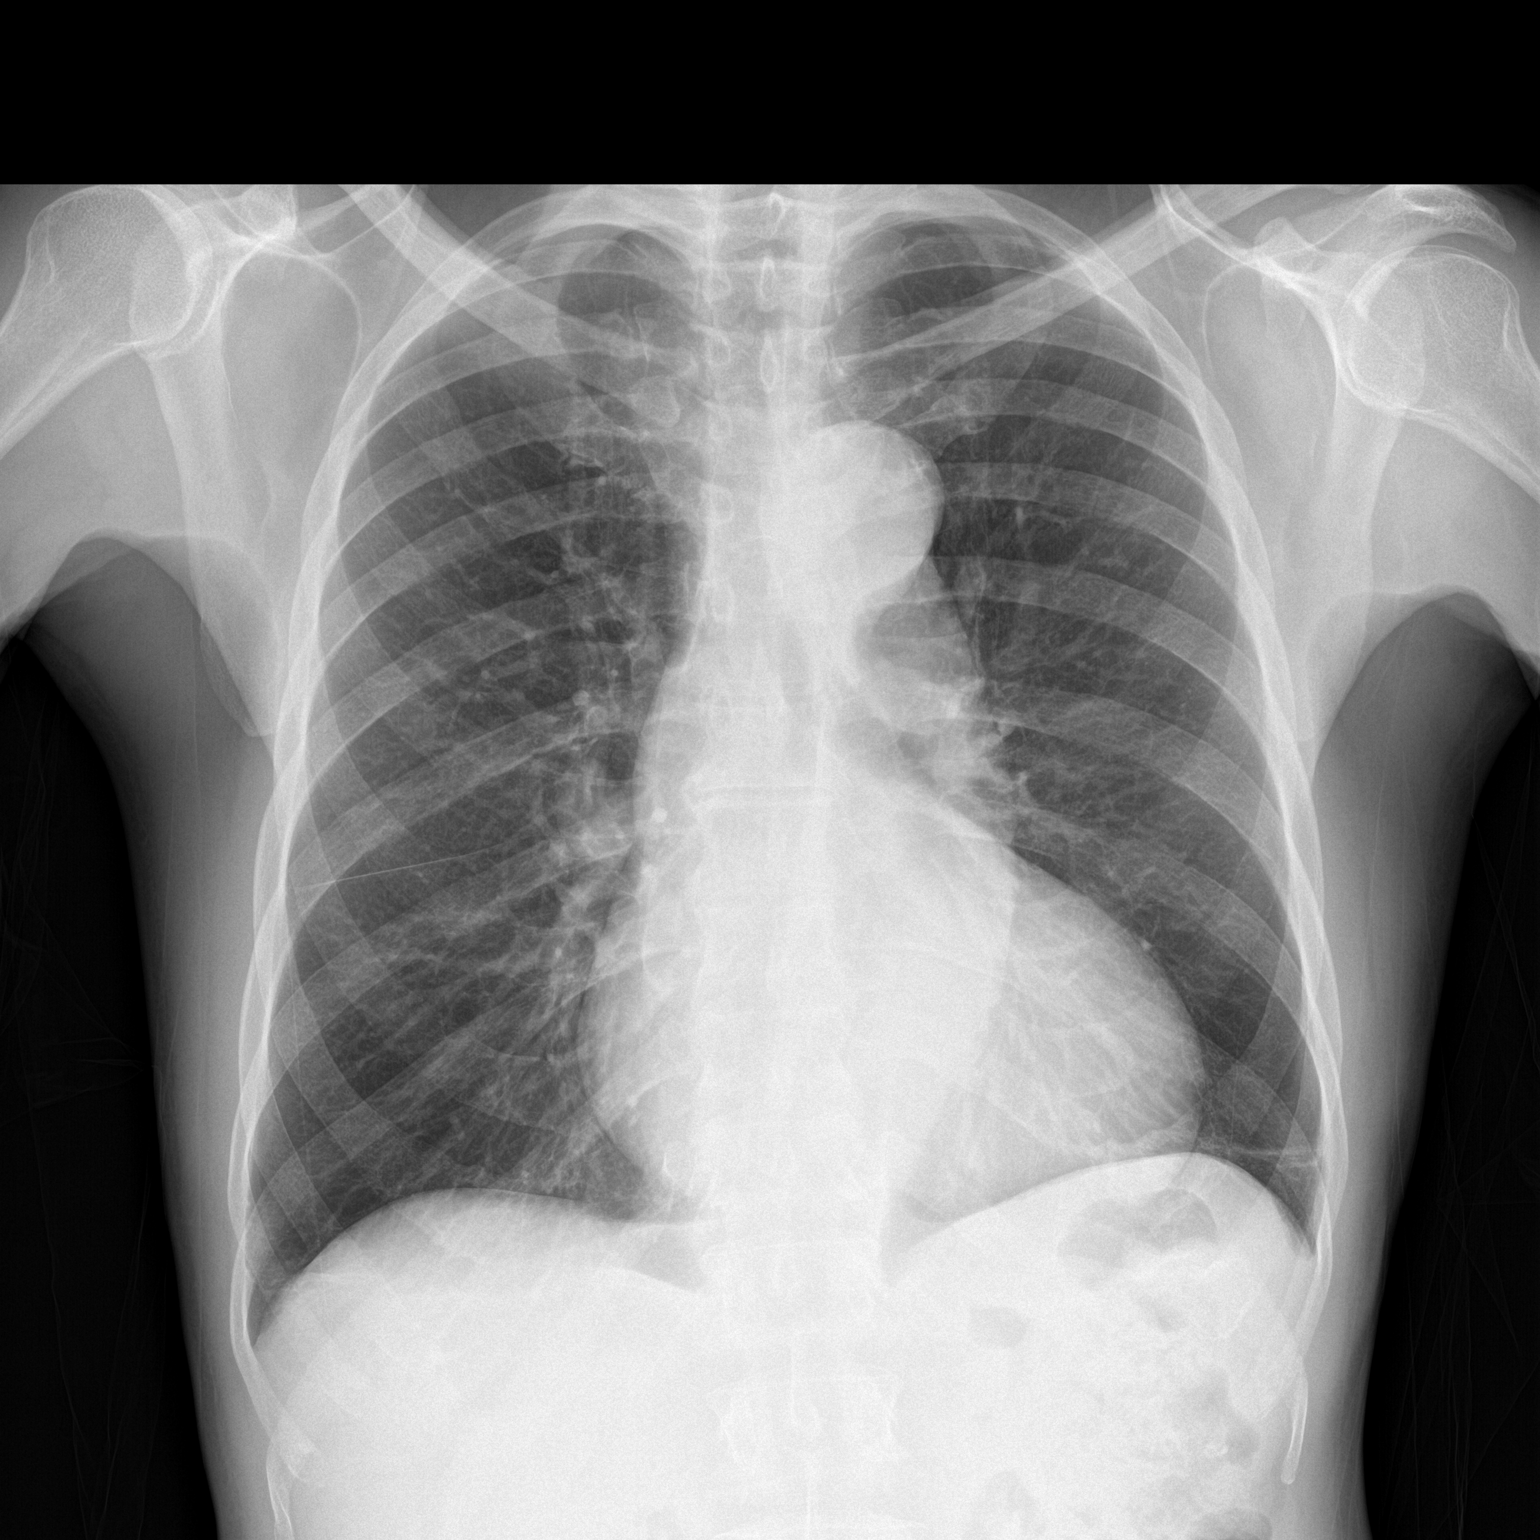

[chest lat]
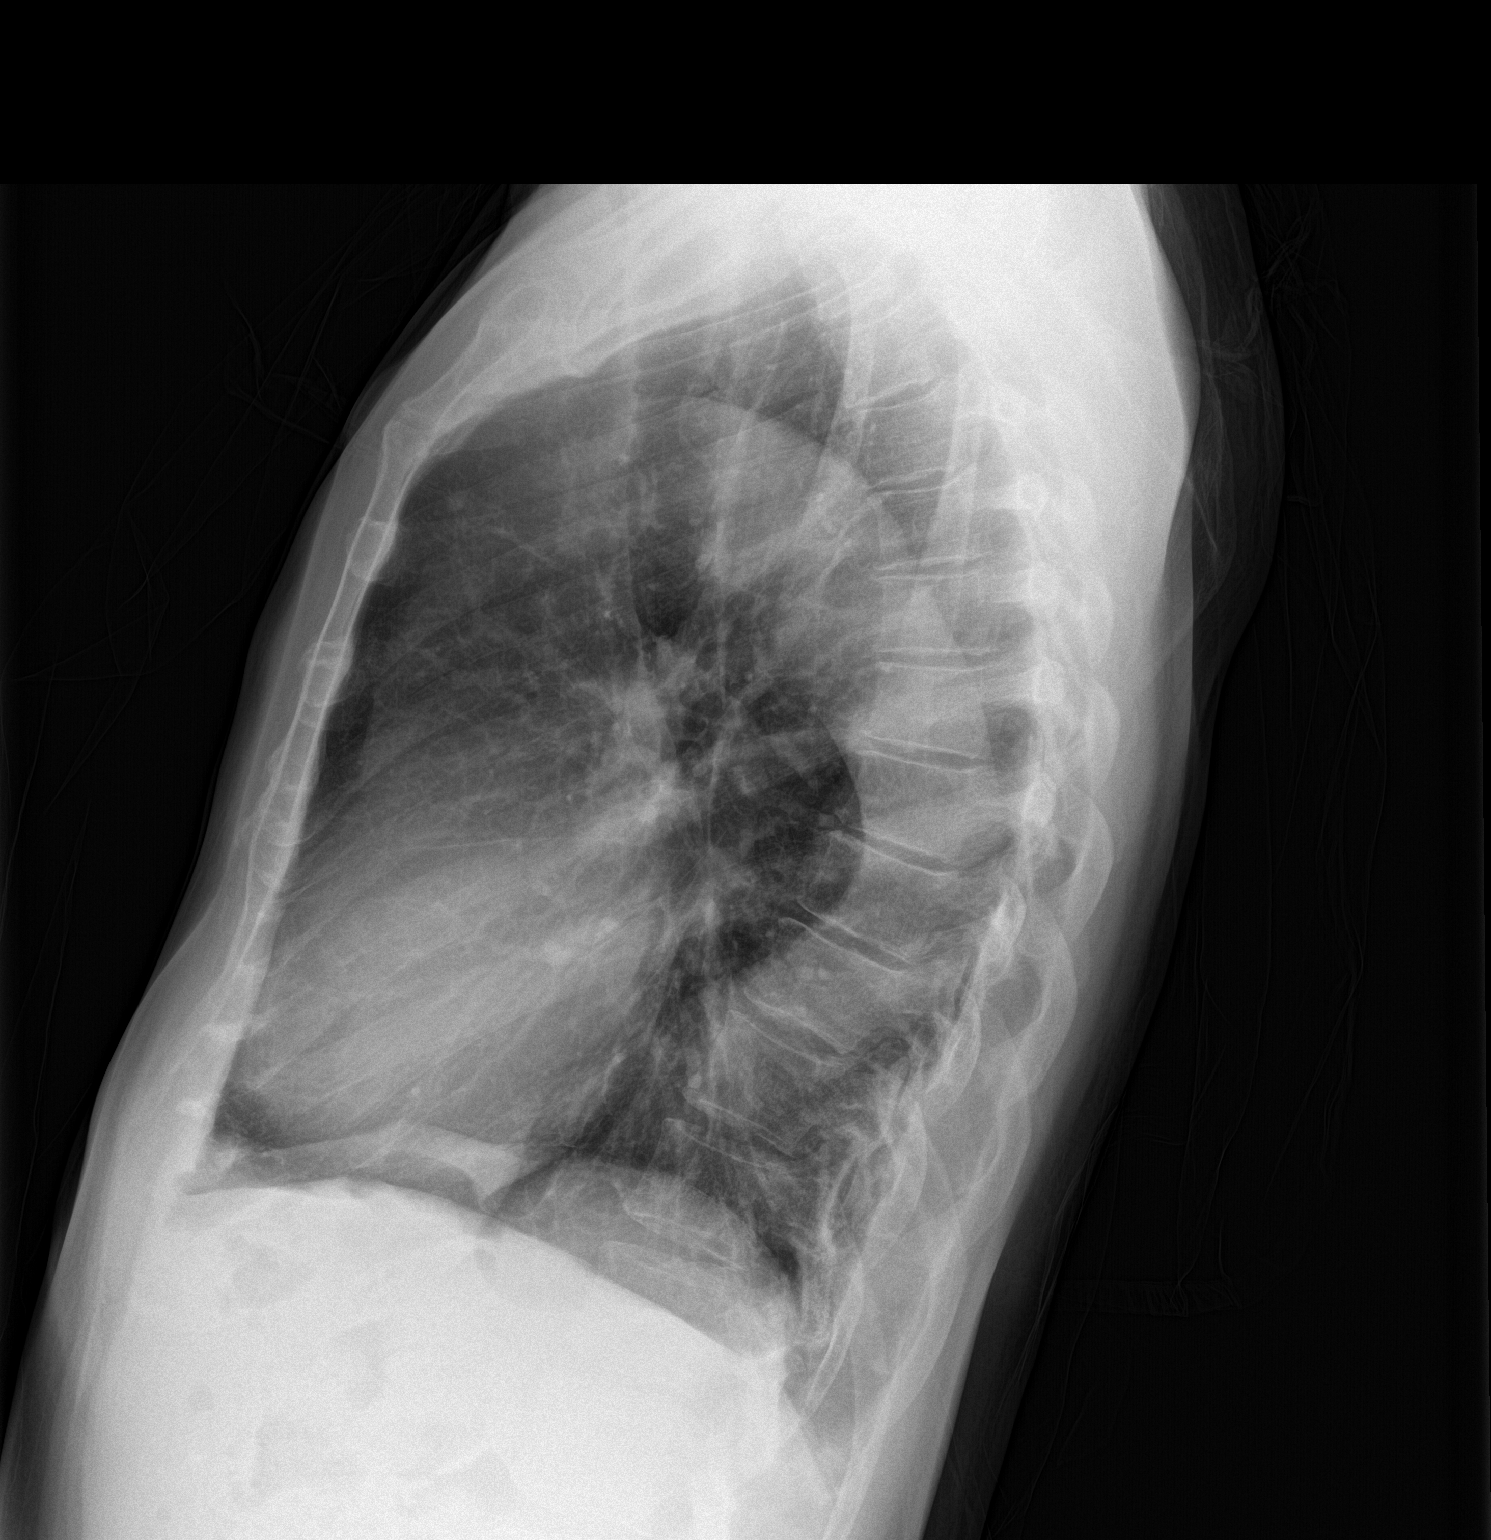

[2 of 2 positions shown; findings below may reference images not displayed]

FINDINGS: The heart is upper limits of normal in size. There is tortuosity and
mild ectasia of the thoracic aorta. The lungs are clear of acute
process. Suspect mild chronic bronchitic changes and bibasilar
scarring. No definite infiltrates, edema or effusions. The bony
thorax is intact.
IMPRESSION: Borderline cardiac enlargement and probable mild bronchitic lung
changes but no acute pulmonary findings.

## 2016-11-20 IMAGING — MR MR HEAD W/O CM
9 of 10 series · 37 of 48 positions shown · non-contrast
Comparison: CT head, as well as CT angio head and neck, earlier
today.

CLINICAL DATA: Patient with known history of hypertension who ran
out of medication last month. Last seen normal at 4411 hr earlier
today. Presents with subsequent onset of RIGHT facial droop and
slurred speech. Current blood pressure reported as 208/100.

EXAM:
MRI HEAD WITHOUT CONTRAST
TECHNIQUE: Multiplanar, multiecho pulse sequences of the brain and surrounding
structures were obtained without intravenous contrast.

[Series 3: DWI · axial · 3.0mm · 1.09mm/px · z∈[-42,+96]mm · 11 of 94 slices shown (1 of 4)]
[im 1/94]
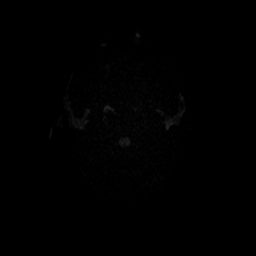
[im 10/94]
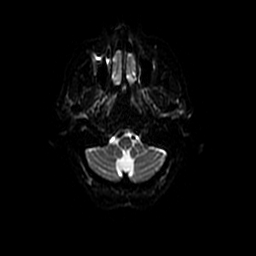
[im 19/94]
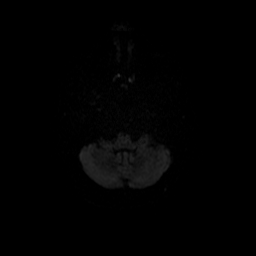
[im 28/94]
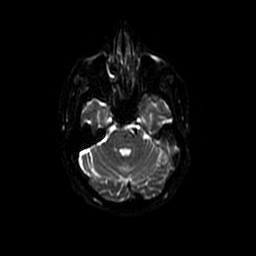
[im 38/94]
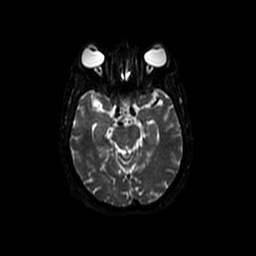
[im 47/94]
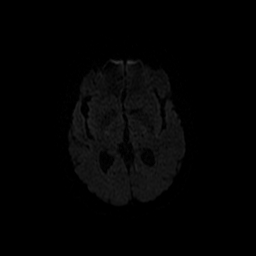
[im 56/94]
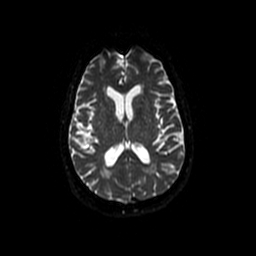
[im 66/94]
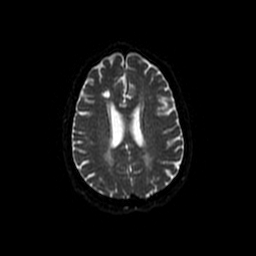
[im 75/94]
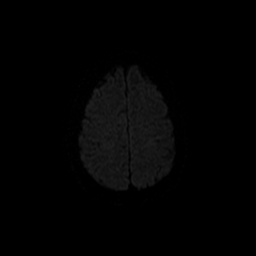
[im 84/94]
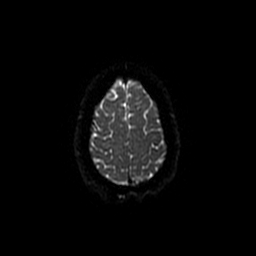
[im 94/94]
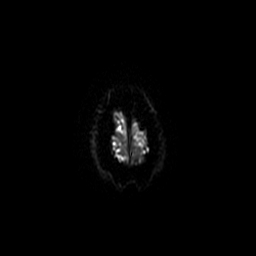

[Series 4: DWI · coronal · 5.0mm · 1.09mm/px · 7 of 70 slices shown (2 of 4)]
[im 1/70]
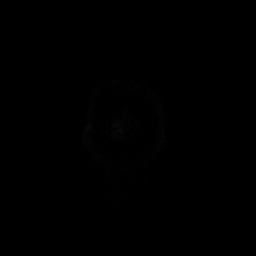
[im 12/70]
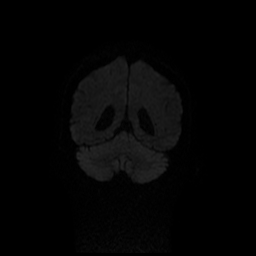
[im 24/70]
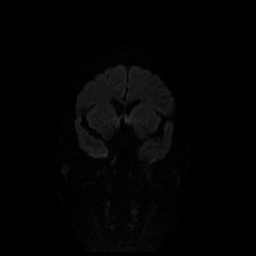
[im 35/70]
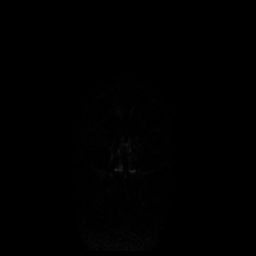
[im 47/70]
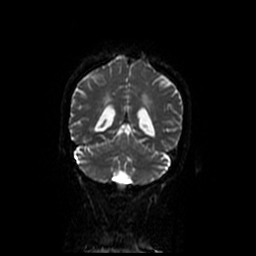
[im 58/70]
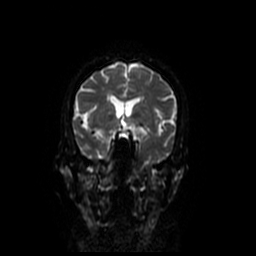
[im 70/70]
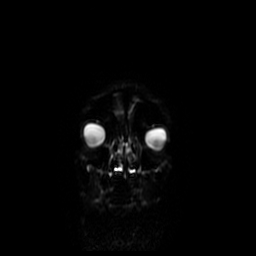

[Series 5: T1 · sagittal · 5.0mm · 0.47mm/px · 2 of 23 slices shown]
[im 1/23]
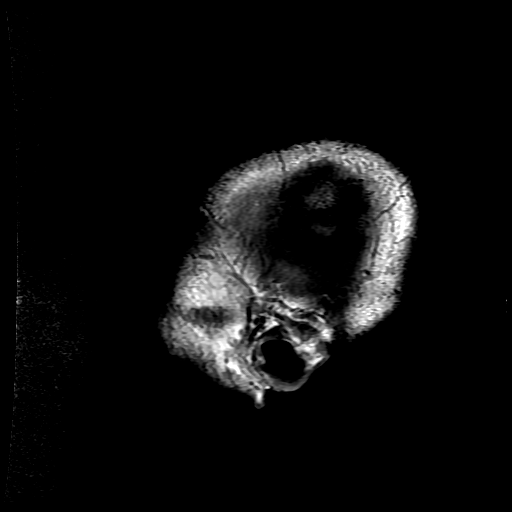
[im 23/23]
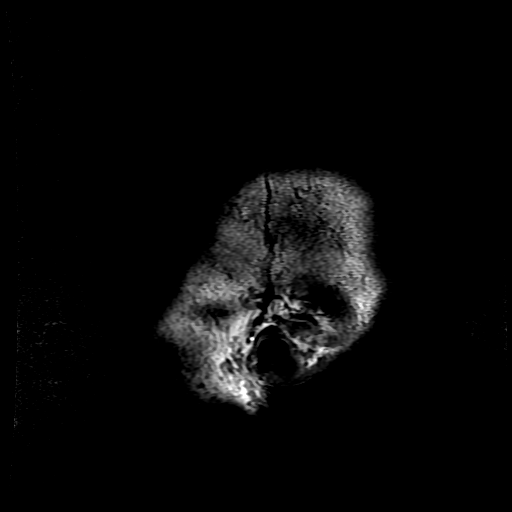

[Series 6: T2 · axial · 5.0mm · 0.47mm/px · z∈[-43,+95]mm · 2 of 24 slices shown (1 of 2)]
[im 1/24]
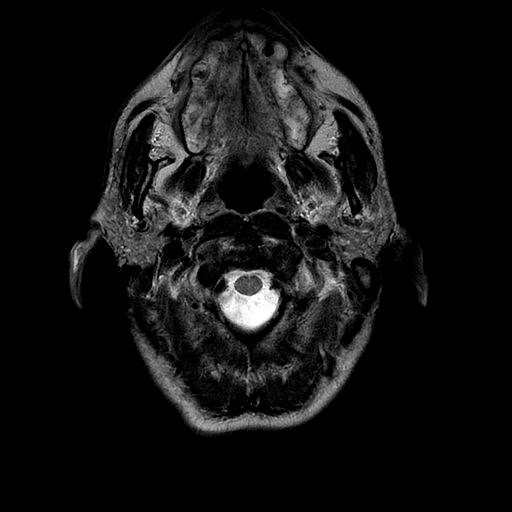
[im 24/24]
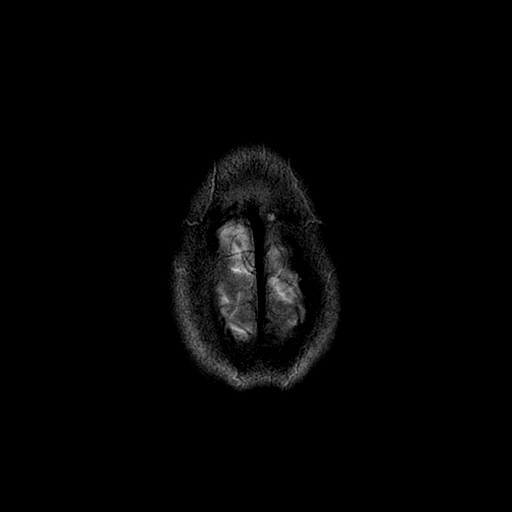

[Series 7: FLAIR · axial · 5.0mm · 0.47mm/px · z∈[-43,+95]mm · 2 of 24 slices shown]
[im 1/24]
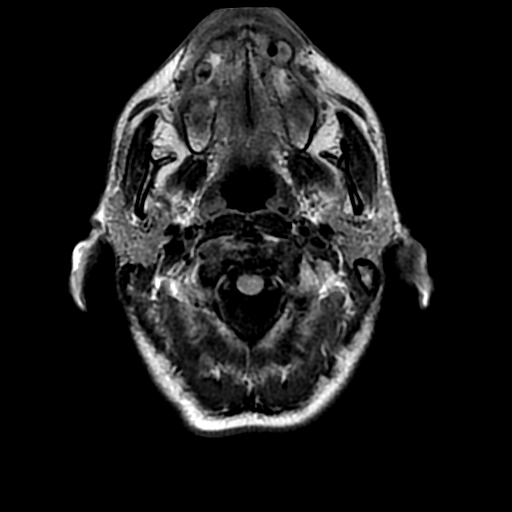
[im 24/24]
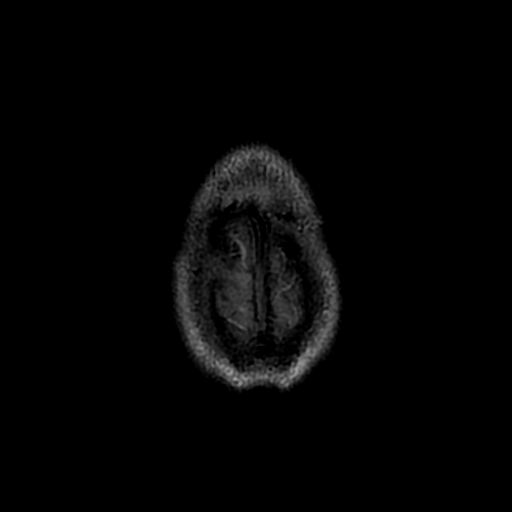

[Series 8: T2 · coronal · 5.0mm · 0.43mm/px · 3 of 29 slices shown (2 of 2)]
[im 1/29]
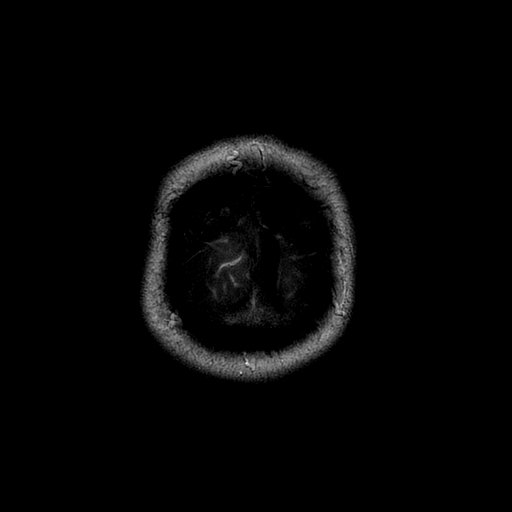
[im 15/29]
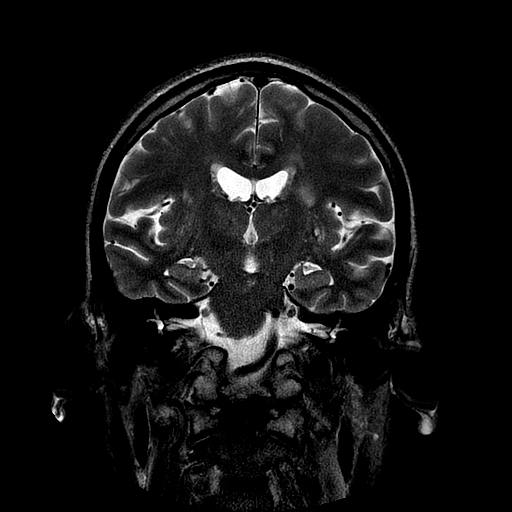
[im 29/29]
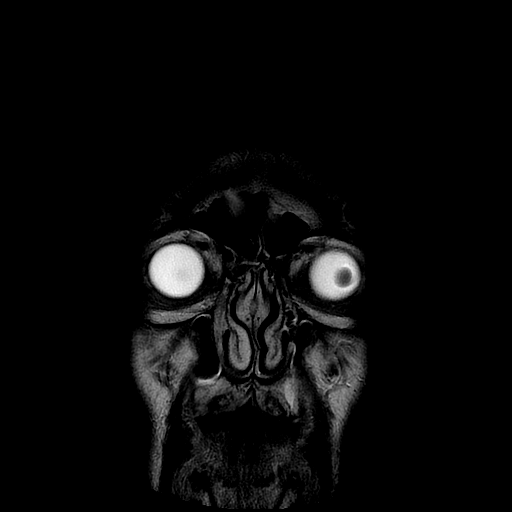

[Series 9: ax mpgr · axial · 5.0mm · 0.47mm/px · 1 of 24 slices shown]
[im 1/24]
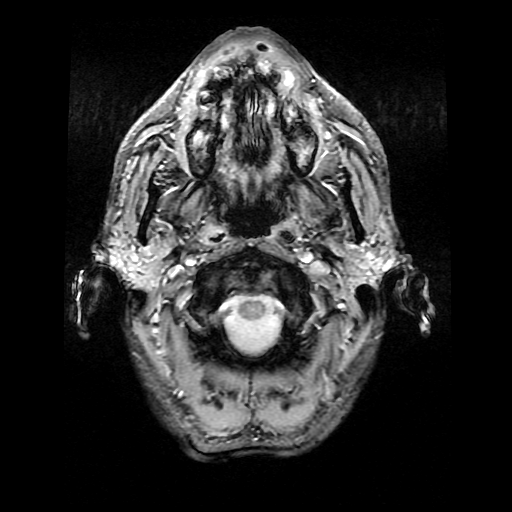

[Series 300: DWI · axial · 3.0mm · 1.09mm/px · z∈[-42,+96]mm · 5 of 47 slices shown (3 of 4)]
[im 1/47]
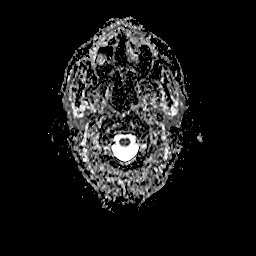
[im 12/47]
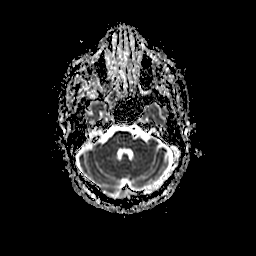
[im 24/47]
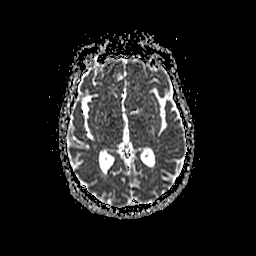
[im 35/47]
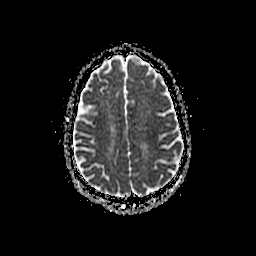
[im 47/47]
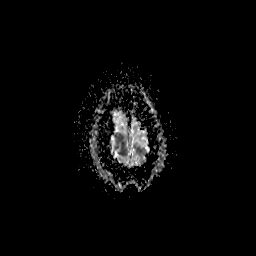

[Series 400: DWI · coronal · 5.0mm · 1.09mm/px · 4 of 35 slices shown (4 of 4)]
[im 1/35]
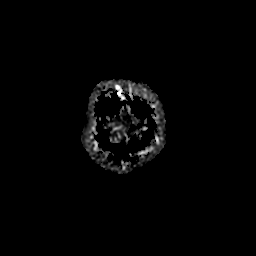
[im 12/35]
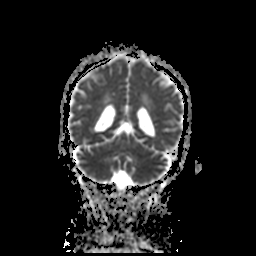
[im 23/35]
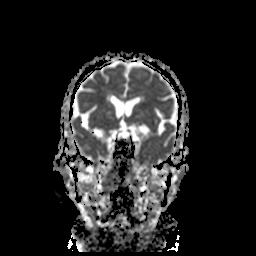
[im 35/35]
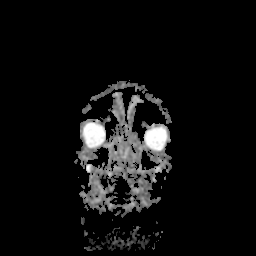

[37 of 48 positions shown; findings below may reference images not displayed]

FINDINGS: Moderate-sized area of restricted diffusion affects the LEFT centrum
semiovale, perhaps involving the superior portion of the LEFT
lentiform nucleus, extending to the periventricular white matter,
consistent with an acute LEFT MCA lenticulostriate territory
infarct. No hemorrhage.

No associated mass lesion, hydrocephalus, or extra-axial fluid.

Mild atrophy. Moderately advanced small vessel disease. Small foci
of chronic hemorrhage (microbleeds) are scattered throughout the
cerebral hemispheres, deep nuclei, brainstem, and cerebellum all
likely sequelae of longstanding hypertensive cerebrovascular
disease. Scattered areas of chronic lacunar infarction, most notable
in the RIGHT frontal subcortical and periventricular white matter.
No midline shift. Dolichoectatic but widely patent intracranial
vasculature. No midline abnormalities. Extracranial soft tissues
unremarkable.

Compared with prior imaging studies earlier today, the acute infarct
is not visible.
IMPRESSION: Moderate-sized area of restricted diffusion affects the LEFT centrum
semiovale, perhaps involving the superior portion of the LEFT
lentiform nucleus, consistent with an acute LEFT MCA
lenticulostriate territory infarct. No hemorrhagic transformation.

No visible large vessel occlusion as correlated with recent CTA and
based on intracranial flow voids.

Sequelae of hypertensive cerebral vascular disease, with multiple
areas of chronic hemorrhage in the brain as well as moderately
advanced small vessel disease.

## 2016-11-28 MED FILL — DESCOVY 200-25 MG TABS: 200-25 | 30 days supply | Qty: 30 | Fill #1

## 2016-11-28 MED FILL — TIVICAY 50 MG TABLET: 50 | 30 days supply | Qty: 30 | Fill #1

## 2016-12-02 ENCOUNTER — Telehealth: Payer: Self-pay

## 2016-12-02 NOTE — Telephone Encounter (Addendum)
Pharmacy requesting 90 day supply of Atorvastatin 40 mg tab

## 2016-12-03 ENCOUNTER — Other Ambulatory Visit: Payer: Self-pay | Admitting: Internal Medicine

## 2016-12-31 ENCOUNTER — Encounter: Payer: Self-pay | Admitting: Internal Medicine

## 2016-12-31 ENCOUNTER — Ambulatory Visit (INDEPENDENT_AMBULATORY_CARE_PROVIDER_SITE_OTHER): Payer: Medicare Other | Admitting: Internal Medicine

## 2016-12-31 ENCOUNTER — Other Ambulatory Visit (HOSPITAL_COMMUNITY)
Admission: RE | Admit: 2016-12-31 | Discharge: 2016-12-31 | Disposition: A | Discharge status: Home / Self Care | Payer: Medicare Other | Source: Ambulatory Visit | Attending: Internal Medicine | Admitting: Internal Medicine

## 2016-12-31 VITALS — BP 123/75 | HR 69 | Temp 97.9°F | Ht 69.0 in | Wt 155.0 lb

## 2016-12-31 DIAGNOSIS — I639 Cerebral infarction, unspecified: Secondary | ICD-10-CM

## 2016-12-31 DIAGNOSIS — Z7189 Other specified counseling: Secondary | ICD-10-CM

## 2016-12-31 DIAGNOSIS — Z7185 Encounter for immunization safety counseling: Secondary | ICD-10-CM

## 2016-12-31 DIAGNOSIS — E7849 Other hyperlipidemia: Secondary | ICD-10-CM

## 2016-12-31 DIAGNOSIS — Z113 Encounter for screening for infections with a predominantly sexual mode of transmission: Secondary | ICD-10-CM

## 2016-12-31 DIAGNOSIS — E784 Other hyperlipidemia: Secondary | ICD-10-CM

## 2016-12-31 DIAGNOSIS — B20 Human immunodeficiency virus [HIV] disease: Secondary | ICD-10-CM | POA: Insufficient documentation

## 2016-12-31 DIAGNOSIS — Z23 Encounter for immunization: Secondary | ICD-10-CM | POA: Diagnosis not present

## 2016-12-31 LAB — CBC WITH DIFFERENTIAL/PLATELET
BASOS ABS: 0 {cells}/uL (ref 0–200)
Basophils Relative: 0 %
Eosinophils Absolute: 40 cells/uL (ref 15–500)
Eosinophils Relative: 1 %
HEMATOCRIT: 41.4 % (ref 38.5–50.0)
HEMOGLOBIN: 13.7 g/dL (ref 13.2–17.1)
Lymphocytes Relative: 43 %
Lymphs Abs: 1720 cells/uL (ref 850–3900)
MCH: 28.8 pg (ref 27.0–33.0)
MCHC: 33.1 g/dL (ref 32.0–36.0)
MCV: 87 fL (ref 80.0–100.0)
MONO ABS: 320 {cells}/uL (ref 200–950)
MPV: 9.6 fL (ref 7.5–12.5)
Monocytes Relative: 8 %
NEUTROS ABS: 1920 {cells}/uL (ref 1500–7800)
NEUTROS PCT: 48 %
Platelets: 149 10*3/uL (ref 140–400)
RBC: 4.76 MIL/uL (ref 4.20–5.80)
RDW: 14.1 % (ref 11.0–15.0)
WBC: 4 10*3/uL (ref 3.8–10.8)

## 2016-12-31 LAB — COMPLETE METABOLIC PANEL WITHOUT GFR
ALT: 14 U/L (ref 9–46)
AST: 17 U/L (ref 10–35)
Albumin: 3.8 g/dL (ref 3.6–5.1)
Alkaline Phosphatase: 88 U/L (ref 40–115)
BUN: 24 mg/dL (ref 7–25)
CO2: 26 mmol/L (ref 20–31)
Calcium: 8.9 mg/dL (ref 8.6–10.3)
Chloride: 102 mmol/L (ref 98–110)
Creat: 1.76 mg/dL — ABNORMAL HIGH (ref 0.70–1.18)
GFR, Est African American: 44 mL/min — ABNORMAL LOW
GFR, Est Non African American: 38 mL/min — ABNORMAL LOW
Glucose, Bld: 117 mg/dL — ABNORMAL HIGH (ref 65–99)
Potassium: 4.1 mmol/L (ref 3.5–5.3)
Sodium: 139 mmol/L (ref 135–146)
Total Bilirubin: 0.4 mg/dL (ref 0.2–1.2)
Total Protein: 6.8 g/dL (ref 6.1–8.1)

## 2016-12-31 LAB — LIPID PANEL
CHOL/HDL RATIO: 1.9 ratio (ref <5.0)
Cholesterol: 95 mg/dL (ref <200)
HDL: 51 mg/dL (ref >40)
LDL CALC: 33 mg/dL (ref <100)
TRIGLYCERIDES: 57 mg/dL (ref <150)
VLDL: 11 mg/dL (ref <30)

## 2016-12-31 LAB — HEPATITIS A ANTIBODY, TOTAL: Hep A Total Ab: NONREACTIVE

## 2016-12-31 LAB — HEPATITIS B CORE ANTIBODY, TOTAL: HEP B C TOTAL AB: REACTIVE — AB

## 2016-12-31 LAB — HEPATITIS B SURFACE ANTIBODY,QUALITATIVE: Hep B S Ab: POSITIVE — AB

## 2016-12-31 LAB — HEPATITIS C ANTIBODY: HCV Ab: NEGATIVE

## 2016-12-31 LAB — HEPATITIS B SURFACE ANTIGEN: HEP B S AG: NEGATIVE

## 2016-12-31 NOTE — Assessment & Plan Note (Signed)
Will screen today 

## 2016-12-31 NOTE — Assessment & Plan Note (Signed)
Labs today and if doing well rtc 6 months.  Condoms offered, refused

## 2016-12-31 NOTE — Assessment & Plan Note (Signed)
I will check his lipid panel today.  Will follow up with PCP

## 2016-12-31 NOTE — Assessment & Plan Note (Signed)
Pneumovax today 

## 2016-12-31 NOTE — Progress Notes (Signed)
   Subjective:    Patient ID: Thomas [name], male    DOB: 02-Jul-1946, 71 y.o.   MRN: [id number]  HPI Here for follow up of HIV.  Last year had a CVA and newly diagnosed HIV and now on Tivicay and Descovy and tolerating well.  On atorvastatin for CVA history.  Denies any missed doses.  No associated n/v/d.  No new issues.  No weight loss.  No other complaints.     Review of Systems  Constitutional: Negative for fatigue.  Gastrointestinal: Negative for diarrhea and nausea.  Skin: Negative for rash.  Neurological: Negative for dizziness.       Objective:   Physical Exam  Constitutional: He appears well-developed and well-nourished. No distress.  HENT:  Mouth/Throat: No oropharyngeal exudate.  Eyes: No scleral icterus.  Cardiovascular: Normal rate, regular rhythm and normal heart sounds.   Pulmonary/Chest: Effort normal and breath sounds normal. No respiratory distress.  Lymphadenopathy:    He has no cervical adenopathy.  Skin: No rash noted.   SH: not sexually active recently       Assessment & Plan:

## 2017-01-01 LAB — T-HELPER CELL (CD4) - (RCID CLINIC ONLY)
CD4 % Helper T Cell: 12 % — ABNORMAL LOW (ref 33–55)
CD4 T Cell Abs: 220 /uL — ABNORMAL LOW (ref 400–2700)

## 2017-01-01 LAB — URINE CYTOLOGY ANCILLARY ONLY
Chlamydia: NEGATIVE
Neisseria Gonorrhea: NEGATIVE

## 2017-01-01 LAB — RPR

## 2017-01-02 LAB — HIV-1 RNA QUANT-NO REFLEX-BLD
HIV 1 RNA Quant: <20 copies/mL — AB
HIV-1 RNA QUANT, LOG: DETECTED {Log_copies}/mL — AB

## 2017-01-06 ENCOUNTER — Other Ambulatory Visit: Payer: Self-pay | Admitting: Internal Medicine

## 2017-01-06 MED FILL — DESCOVY 200-25 MG TABS: 200-25 | 30 days supply | Qty: 30 | Fill #2

## 2017-01-06 MED FILL — TIVICAY 50 MG TABLET: 50 | 30 days supply | Qty: 30 | Fill #2

## 2017-01-08 ENCOUNTER — Encounter: Payer: Self-pay | Admitting: Internal Medicine

## 2017-01-08 ENCOUNTER — Ambulatory Visit (INDEPENDENT_AMBULATORY_CARE_PROVIDER_SITE_OTHER): Payer: Medicare Other | Admitting: Internal Medicine

## 2017-01-08 VITALS — BP 137/71 | HR 72 | Temp 98.5°F | Wt 153.9 lb

## 2017-01-08 DIAGNOSIS — Z1211 Encounter for screening for malignant neoplasm of colon: Secondary | ICD-10-CM | POA: Insufficient documentation

## 2017-01-08 DIAGNOSIS — Z21 Asymptomatic human immunodeficiency virus [HIV] infection status: Secondary | ICD-10-CM | POA: Diagnosis not present

## 2017-01-08 DIAGNOSIS — Z79899 Other long term (current) drug therapy: Secondary | ICD-10-CM

## 2017-01-08 DIAGNOSIS — I10 Essential (primary) hypertension: Secondary | ICD-10-CM

## 2017-01-08 DIAGNOSIS — R5383 Other fatigue: Secondary | ICD-10-CM

## 2017-01-08 DIAGNOSIS — E785 Hyperlipidemia, unspecified: Secondary | ICD-10-CM | POA: Diagnosis not present

## 2017-01-08 DIAGNOSIS — B20 Human immunodeficiency virus [HIV] disease: Secondary | ICD-10-CM

## 2017-01-08 DIAGNOSIS — E7849 Other hyperlipidemia: Secondary | ICD-10-CM

## 2017-01-08 DIAGNOSIS — E041 Nontoxic single thyroid nodule: Secondary | ICD-10-CM

## 2017-01-08 DIAGNOSIS — Z8673 Personal history of transient ischemic attack (TIA), and cerebral infarction without residual deficits: Secondary | ICD-10-CM

## 2017-01-08 DIAGNOSIS — I63312 Cerebral infarction due to thrombosis of left middle cerebral artery: Secondary | ICD-10-CM

## 2017-01-08 DIAGNOSIS — Z7982 Long term (current) use of aspirin: Secondary | ICD-10-CM

## 2017-01-08 DIAGNOSIS — Z0000 Encounter for general adult medical examination without abnormal findings: Secondary | ICD-10-CM | POA: Insufficient documentation

## 2017-01-08 MED ORDER — AMLODIPINE BESYLATE 5 MG PO TABS: 5.0000 mg | ORAL_TABLET | Freq: Every day | ORAL | 0 refills | Status: DC

## 2017-01-08 MED ORDER — ATORVASTATIN CALCIUM 40 MG PO TABS: ORAL_TABLET | ORAL | 3 refills | Status: DC

## 2017-01-08 NOTE — Assessment & Plan Note (Signed)
Tolerating statin well. Will continue high-intensity Atorvastatin 40 mg daily for secondary prophylaxis after CVA.

## 2017-01-08 NOTE — Assessment & Plan Note (Signed)
BP is well-controlled based on home BP readings. His creatinine is trending up which is likely related to his ACE-I and thiazide diuretic. I have advised patient to return to clinic in about 1 month for repeat of his BMET to assess creatinine. If still elevated we may need to consider adjusting his medications. He does have room to go up on his Amlodipine if needed. Will continue current medications for now. - Continue Lisinopril-HCTZ 40-25 mg daily - Continue Amlodipine 5 mg daily - f/u ~1 month for BMET

## 2017-01-08 NOTE — Assessment & Plan Note (Signed)
Follows with Infectious Disease, last seen on 12/31/16. He reports adherence to Tivicay and Descovy. CD4 was 220 and RNA <20 on 12/31/16. Appears well-controlled. He will follow up with ID as scheduled.

## 2017-01-08 NOTE — Progress Notes (Deleted)
   CC: ***  HPI:  [name] is a 71 y.o.   Past Medical History:  Diagnosis Date  . 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  Recommend 1 year follow up with CT or MRI.   . Enlarged thyroid 04/22/2015   Seen on CTA 04/21/15: Contains multiple nodules measuring up to 2.1 cm in size, TSH wnl.  Needs thyroid ultrasound and biopsy  . Heart murmur   . HIV (human immunodeficiency virus infection) (HCC)   . HTN (hypertension)   . Immune deficiency disorder (HCC)   . Stroke [facility](HCC)     Review of Systems:  ***  Physical Exam:  Vitals:   01/08/17 1353  BP: 137/71  Pulse: 72  Temp: 98.5 F (36.9 C)  TempSrc: Oral  SpO2: 100%  Weight: 153 lb 14.4 oz (69.8 kg)   ***  Assessment & Plan:   See Encounters Tab for problem based charting.  Patient {GC/GE:[id number]::"discussed with","seen with"} Dr. {NAMES:[id number]::"Butcher","Granfortuna","E. Hoffman","Klima","Mullen","Narendra","Vincent"}

## 2017-01-08 NOTE — Assessment & Plan Note (Signed)
He declines stool card/FIT or Tdap today.

## 2017-01-08 NOTE — Assessment & Plan Note (Signed)
Currently stable. Will continue ASA 325 mg daily and high-intensity Atorvastatin 40 mg daily for secondary prevention.

## 2017-01-08 NOTE — Assessment & Plan Note (Signed)
Unclear cause for his low energy/fatigue. He says his decreased energy level is not really bothersome. He has no obvious bleeding and CBC 1 week ago was normal. He has not been screened for colorectal cancer and declines screening today. He does have a history of an enlarged thyroid with a thyroid nodule. Biopsy was consistent with a benign thyroid nodule. He denies any symptoms of depression. His HIV appears to be well-controlled. He reports a good appetite and says he is eating well. He may have some deconditioning related to increasing his activity and age. Will check a TSH today and continue to monitor. - f/u TSH

## 2017-01-08 NOTE — Patient Instructions (Addendum)
It was a pleasure to see you again Thomas [name].  I have refilled your Amlodipine and Atorvastatin (Lipitor).  Your blood pressures look good, keep up the good work!  Drink plenty of water to stay hydrated.  We will check a thyroid test to assess your low energy.  Please follow up in 1 month for repeat lab work and blood pressure check.

## 2017-01-08 NOTE — Progress Notes (Signed)
CC: HTN  HPI:  Thomas [name] is a 71 y.o. male with PMH as listed below including HTN, CVA, HIV, and Benign Thyroid Nodule who presents for follow up management of his HTN. Please see problem based charting for status of patients chronic medical issues.  HTN: Patient reports adherence to Lisinopril-HCTZ 40-25 mg daily and Amlodipine 5 mg daily. He checks his BP twice a day and brought a log from the last 9 weeks. Most readings fall between a systolic of 110-130 and diastolic of 70-85. His creatinine was 1.76 on 12/31/16, up from 1.3-1.4.   HIV: Follows with Infectious Disease, last seen on 12/31/16. He reports adherence to Tivicay and Descovy. CD4 was 220 and RNA <20 on 12/31/16.   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 He has been taking ASA 325 mg daily and Atorvastatin 40 mg daily for secondary prevention. He has residual RUE weakness that is unchanged from prior. He denies any new focal deficits or change in sensation.  Low energy: Patient reports noticing that he has decreased energy recently. He says he has been more active lately and wonders if his energy level is low from deconditioning. He denies any other associated symptoms including chest pain, palpitations, dyspnea, fevers, chills, diaphoresis, abdominal pain, diarrhea, constipation, nausea, vomiting, diarrhea, dysuria, urinary frequency, hematuria, hematochezia, melena, hemoptysis, myalgias, syncope, decreased appetite, depression, difficulty sleeping, loss of interest in activities, or change in weight.  Healthcare Maintenance: Patient has not had colorectal cancer screening. He is due for Tdap.  Past Medical History:  Diagnosis Date  . 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  Recommend 1 year follow up with CT or MRI.   . Enlarged thyroid 04/22/2015   Seen on CTA 04/21/15: Contains multiple nodules  measuring up to 2.1 cm in size, TSH wnl.  Needs thyroid ultrasound and biopsy  . Heart murmur   . HIV (human immunodeficiency virus infection) (HCC)   . HTN (hypertension)   . Immune deficiency disorder (HCC)   . Stroke [facility])     Review of Systems:   Review of Systems  Constitutional: Positive for malaise/fatigue. Negative for chills, diaphoresis, fever and weight loss.  Respiratory: Negative for cough, hemoptysis, sputum production, shortness of breath and wheezing.   Cardiovascular: Negative for chest pain, orthopnea and leg swelling.  Gastrointestinal: Negative for abdominal pain, blood in stool, constipation, diarrhea, melena, nausea and vomiting.  Genitourinary: Negative for frequency.  Neurological: Negative for dizziness, weakness and headaches.  Psychiatric/Behavioral: Negative for depression.     Physical Exam:  Vitals:   01/08/17 1353  BP: 137/71  Pulse: 72  Temp: 98.5 F (36.9 C)  TempSrc: Oral  SpO2: 100%  Weight: 153 lb 14.4 oz (69.8 kg)   Physical Exam  Constitutional: He is oriented to person, place, and time. No distress.  Thin pleasant man  HENT:  Head: Normocephalic and atraumatic.  Neck: Normal range of motion.  Cardiovascular: Normal rate and regular rhythm.   No murmur heard. Pulmonary/Chest: Effort normal. No respiratory distress. He has no wheezes. He has no rales.  Abdominal: Soft. He exhibits no distension. There is no tenderness.  Musculoskeletal: He exhibits no edema or tenderness.  Strength 4/5 RUE, 5/5 all other extremities  Neurological: He is alert and oriented to person, place, and time.  Skin: Skin is warm. He is not diaphoretic.  Assessment & Plan:   See Encounters Tab for problem based charting.  Patient discussed with Dr. Heide SparkNarendra  Essential hypertension BP is well-controlled based on home BP readings. His creatinine is trending up which is likely related to his ACE-I and thiazide diuretic. I have advised patient to return  to clinic in about 1 month for repeat of his BMET to assess creatinine. If still elevated we may need to consider adjusting his medications. He does have room to go up on his Amlodipine if needed. Will continue current medications for now. - Continue Lisinopril-HCTZ 40-25 mg daily - Continue Amlodipine 5 mg daily - f/u ~1 month for BMET  HIV disease (HCC) Follows with Infectious Disease, last seen on 12/31/16. He reports adherence to Tivicay and Descovy. CD4 was 220 and RNA <20 on 12/31/16. Appears well-controlled. He will follow up with ID as scheduled.  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 Will continue ASA 325 mg daily and high-intensity Atorvastatin 40 mg daily for secondary prevention.  Low energy Unclear cause for his low energy/fatigue. He says his decreased energy level is not really bothersome. He has no obvious bleeding and CBC 1 week ago was normal. He has not been screened for colorectal cancer and declines screening today. He does have a history of an enlarged thyroid with a thyroid nodule. Biopsy was consistent with a benign thyroid nodule. He denies any symptoms of depression. His HIV appears to be well-controlled. He reports a good appetite and says he is eating well. He may have some deconditioning related to increasing his activity and age. Will check a TSH today and continue to monitor. - f/u TSH  Healthcare maintenance He declines stool card/FIT or Tdap today.  HLD (hyperlipidemia) Tolerating statin well. Will continue high-intensity Atorvastatin 40 mg daily for secondary prophylaxis after CVA.

## 2017-01-09 LAB — TSH: TSH: 0.703 u[IU]/mL (ref 0.450–4.500)

## 2017-01-09 NOTE — Progress Notes (Signed)
Internal Medicine Clinic Attending  Case discussed with Dr. Patel,Vishal at the time of the visit.  We reviewed the resident's history and exam and pertinent patient test results.  I agree with the assessment, diagnosis, and plan of care documented in the resident's note.  

## 2017-02-06 MED FILL — TIVICAY 50 MG TABLET: 50 | 30 days supply | Qty: 30 | Fill #3

## 2017-02-06 MED FILL — DESCOVY 200-25 MG TABS: 200-25 | 30 days supply | Qty: 30 | Fill #3

## 2017-02-26 ENCOUNTER — Encounter: Payer: Self-pay | Admitting: Internal Medicine

## 2017-02-26 ENCOUNTER — Ambulatory Visit (INDEPENDENT_AMBULATORY_CARE_PROVIDER_SITE_OTHER): Payer: Medicare Other | Admitting: Internal Medicine

## 2017-02-26 VITALS — BP 127/78 | HR 62 | Temp 97.7°F | Ht 69.0 in | Wt 153.8 lb

## 2017-02-26 DIAGNOSIS — B20 Human immunodeficiency virus [HIV] disease: Secondary | ICD-10-CM

## 2017-02-26 DIAGNOSIS — E785 Hyperlipidemia, unspecified: Secondary | ICD-10-CM | POA: Diagnosis not present

## 2017-02-26 DIAGNOSIS — Z23 Encounter for immunization: Secondary | ICD-10-CM

## 2017-02-26 DIAGNOSIS — Z8673 Personal history of transient ischemic attack (TIA), and cerebral infarction without residual deficits: Secondary | ICD-10-CM | POA: Diagnosis not present

## 2017-02-26 DIAGNOSIS — Z21 Asymptomatic human immunodeficiency virus [HIV] infection status: Secondary | ICD-10-CM

## 2017-02-26 DIAGNOSIS — Z0000 Encounter for general adult medical examination without abnormal findings: Secondary | ICD-10-CM

## 2017-02-26 DIAGNOSIS — Z79899 Other long term (current) drug therapy: Secondary | ICD-10-CM | POA: Diagnosis not present

## 2017-02-26 DIAGNOSIS — I10 Essential (primary) hypertension: Secondary | ICD-10-CM | POA: Diagnosis not present

## 2017-02-26 DIAGNOSIS — Z7982 Long term (current) use of aspirin: Secondary | ICD-10-CM

## 2017-02-26 DIAGNOSIS — I63312 Cerebral infarction due to thrombosis of left middle cerebral artery: Secondary | ICD-10-CM

## 2017-02-26 DIAGNOSIS — E7849 Other hyperlipidemia: Secondary | ICD-10-CM

## 2017-02-26 NOTE — Assessment & Plan Note (Signed)
Currently well-controlled. He will continue Tivicay and Descovy and follow up with ID as scheduled.

## 2017-02-26 NOTE — Progress Notes (Signed)
CC: HTN  HPI:  [name] is a 71 y.o. male with PMH as listed below including HTN, CVA, HIV, and Benign Thyroid Nodule who presents for follow up management of his HTN. Please see problem based charting for status of patients chronic medical issues.  HTN: Patient reports adherence to Lisinopril-HCTZ 40-25 mg daily and Amlodipine 5 mg daily. His creatinine was 1.76 on 12/31/16, up from his baseline of 1.3-1.4.   HIV: Follows with Infectious Disease, last seen on 12/31/16. He reports adherence to Tivicay and Descovy. CD4 was 220 and RNA <20 on 12/31/16. He is off prophylactic Bactrim now.  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 He has been taking ASA 325 mg daily and Atorvastatin 40 mg daily for secondary prevention. He has mild residual RUE weakness that is unchanged from prior. He denies any new focal deficits or change in sensation.  HLD: He is tolerating high-intensity Atorvastatin 40 mg daily.  Healthcare Maintenance: Patient has not had colorectal cancer screening. He defers colonoscopy for FIT stool testing. He is agreeable for the Tdap this visit.   Past Medical History:  Diagnosis Date  . 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 Ectatic thoracic aorta ([place]) 04/22/2015   Noted on CTA on 04/21/15.  Recommend 1 year follow up with CT or MRI.   . Enlarged thyroid 04/22/2015   Seen on CTA 04/21/15: Contains multiple nodules measuring up to 2.1 cm in size, TSH wnl.  Needs thyroid ultrasound and biopsy  . Heart murmur   . HIV (human immunodeficiency virus infection) ([place])   . HTN (hypertension)   . Immune deficiency disorder ([place])   . Stroke [facility])     Review of Systems:   Review of Systems  Constitutional: Negative for chills, diaphoresis, fever and malaise/fatigue.  Respiratory: Negative for shortness of breath and wheezing.   Cardiovascular: Negative for chest pain, palpitations and leg swelling.  Gastrointestinal:  Negative for abdominal pain, blood in stool, constipation, diarrhea, melena, nausea and vomiting.  Musculoskeletal: Negative for falls and myalgias.  Neurological: Negative for dizziness.       Decreased strength LUE, greatly improved from prior stroke deficit.      Physical Exam:  Vitals:   02/26/17 1346  BP: 127/78  Pulse: 62  Temp: 97.7 F (36.5 C)  TempSrc: Oral  SpO2: 99%  Weight: 153 lb 12.8 oz (69.8 kg)  Height: '5\' 9"'  (1.753 m)   Physical Exam  Constitutional: He is oriented to person, place, and time. He appears well-developed. No distress.  Cardiovascular: Normal rate and regular rhythm.   No murmur heard. Pulmonary/Chest: Effort normal. No respiratory distress. He has no wheezes.  Musculoskeletal: Normal range of motion. He exhibits no edema or tenderness.  RUE only slightly weaker compared to LUE with extension/flexion at the elbows, shoulder shrug, and hand grip, otherwise 5/5.  Neurological: He is alert and oriented to person, place, and time.  Skin: He is not diaphoretic.  Psychiatric: He has a normal mood and affect.    Assessment & Plan:   See Encounters Tab for problem based charting.  Patient discussed with Dr. Dareen Piano  Essential hypertension BP Readings from Last 3 Encounters:  02/26/17 127/78  01/08/17 137/71  12/31/16 123/75   BP is well-controlled with current management. Will check BMET today to reassess creatinine. If creatinine is still elevated or rising, we will consider increasing his Amlodipine from 5 mg to 10 mg daily  and cut back his Lisinopril-HCTZ from 40-25 mg to 20-12.5 mg daily. Otherwise we will continue current management.  HIV disease ([place]) Currently well-controlled. He will continue Tivicay and Descovy and follow up with ID as scheduled.  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 No new deficits. We will continue ASA 325 mg daily and high-intensity Atorvastatin 40  mg daily for secondary prevention.  HLD (hyperlipidemia) On appropriate therapy. We will continue high-intensity Atorvastatin 40 mg daily for secondary prevention after CVA.  Healthcare maintenance Patient given Tdap this visit. He is also provided FIT stool card testing kit for colorectal cancer screening. If negative, we will continue with annual screening utilizing this method. If positive, we will refer him to GI for colonoscopy. Patient understands and agrees to plan.

## 2017-02-26 NOTE — Assessment & Plan Note (Signed)
Currently stable. No new deficits. We will continue ASA 325 mg daily and high-intensity Atorvastatin 40 mg daily for secondary prevention.

## 2017-02-26 NOTE — Assessment & Plan Note (Signed)
Patient given Tdap this visit. He is also provided FIT stool card testing kit for colorectal cancer screening. If negative, we will continue with annual screening utilizing this method. If positive, we will refer him to GI for colonoscopy. Patient understands and agrees to plan.

## 2017-02-26 NOTE — Assessment & Plan Note (Signed)
On appropriate therapy. We will continue high-intensity Atorvastatin 40 mg daily for secondary prevention after CVA.

## 2017-02-26 NOTE — Assessment & Plan Note (Signed)
BP Readings from Last 3 Encounters:  02/26/17 127/78  01/08/17 137/71  12/31/16 123/75   BP is well-controlled with current management. Will check BMET today to reassess creatinine. If creatinine is still elevated or rising, we will consider increasing his Amlodipine from 5 mg to 10 mg daily and cut back his Lisinopril-HCTZ from 40-25 mg to 20-12.5 mg daily. Otherwise we will continue current management.

## 2017-02-26 NOTE — Patient Instructions (Addendum)
It was a pleasure to see you again Thomas [name].  I am glad you are feeling better. Your blood pressure looks good, keep up the great work!  I am checking blood work today to assess your kidney function. We may need to adjust your blood pressure medications based on the results.  We are giving you the Tetanus vaccine today and a stool card for colorectal cancer screening.  Please follow up with me in 3 months or sooner if needed.

## 2017-02-27 LAB — BMP8+ANION GAP
Anion Gap: 16 mmol/L (ref 10.0–18.0)
BUN/Creatinine Ratio: 13 (ref 10–24)
BUN: 18 mg/dL (ref 8–27)
CALCIUM: 9.8 mg/dL (ref 8.6–10.2)
CO2: 26 mmol/L (ref 20–29)
Chloride: 100 mmol/L (ref 96–106)
Creatinine, Ser: 1.36 mg/dL — ABNORMAL HIGH (ref 0.76–1.27)
GFR, EST AFRICAN AMERICAN: 60 mL/min/{1.73_m2} (ref >59)
GFR, EST NON AFRICAN AMERICAN: 52 mL/min/{1.73_m2} — AB (ref >59)
Glucose: 120 mg/dL — ABNORMAL HIGH (ref 65–99)
Potassium: 4.3 mmol/L (ref 3.5–5.2)
Sodium: 142 mmol/L (ref 134–144)

## 2017-02-27 NOTE — Progress Notes (Signed)
Internal Medicine Clinic Attending  Case discussed with Dr. Patel,Vishal at the time of the visit.  We reviewed the resident's history and exam and pertinent patient test results.  I agree with the assessment, diagnosis, and plan of care documented in the resident's note.  

## 2017-03-07 ENCOUNTER — Other Ambulatory Visit: Payer: Self-pay | Admitting: Internal Medicine

## 2017-03-07 DIAGNOSIS — B20 Human immunodeficiency virus [HIV] disease: Secondary | ICD-10-CM

## 2017-03-10 ENCOUNTER — Other Ambulatory Visit: Payer: Self-pay | Admitting: Internal Medicine

## 2017-03-10 DIAGNOSIS — I10 Essential (primary) hypertension: Secondary | ICD-10-CM

## 2017-03-10 MED FILL — DESCOVY 200-25 MG TABS: 200-25 | 30 days supply | Qty: 30 | Fill #0

## 2017-03-10 MED FILL — TIVICAY 50 MG TABLET: 50 | 30 days supply | Qty: 30 | Fill #0

## 2017-03-19 ENCOUNTER — Other Ambulatory Visit (INDEPENDENT_AMBULATORY_CARE_PROVIDER_SITE_OTHER): Payer: Medicare Other

## 2017-03-19 DIAGNOSIS — Z0000 Encounter for general adult medical examination without abnormal findings: Secondary | ICD-10-CM

## 2017-03-19 DIAGNOSIS — Z1211 Encounter for screening for malignant neoplasm of colon: Secondary | ICD-10-CM | POA: Diagnosis not present

## 2017-03-19 LAB — IFOBT (OCCULT BLOOD): IFOBT: NEGATIVE

## 2017-04-03 MED FILL — DESCOVY 200-25 MG TABS: 200-25 | 30 days supply | Qty: 30 | Fill #1

## 2017-04-03 MED FILL — TIVICAY 50 MG TABLET: 50 | 30 days supply | Qty: 30 | Fill #1

## 2017-04-22 NOTE — Telephone Encounter (Signed)
Review  Tammy Gorden Harms, RN

## 2017-05-12 MED FILL — TIVICAY 50 MG TABLET: 50 | 30 days supply | Qty: 30 | Fill #2

## 2017-05-12 MED FILL — DESCOVY 200-25 MG TABS: 200-25 | 30 days supply | Qty: 30 | Fill #2

## 2017-06-06 MED FILL — DESCOVY 200-25 MG TABS: 200-25 | 30 days supply | Qty: 30 | Fill #3

## 2017-06-06 MED FILL — TIVICAY 50 MG TABLET: 50 | 30 days supply | Qty: 30 | Fill #3

## 2017-06-30 ENCOUNTER — Other Ambulatory Visit: Payer: Self-pay | Admitting: Internal Medicine

## 2017-06-30 DIAGNOSIS — B20 Human immunodeficiency virus [HIV] disease: Secondary | ICD-10-CM

## 2017-06-30 MED FILL — TIVICAY 50 MG TABLET: 50 | 30 days supply | Qty: 30 | Fill #0

## 2017-06-30 MED FILL — DESCOVY 200-25 MG TABS: 200-25 | 30 days supply | Qty: 30 | Fill #0

## 2017-07-22 ENCOUNTER — Ambulatory Visit: Payer: Medicare Other | Admitting: Internal Medicine

## 2017-07-25 MED FILL — DESCOVY 200-25 MG TABS: 200-25 | 30 days supply | Qty: 30 | Fill #1

## 2017-07-25 MED FILL — TIVICAY 50 MG TABLET: 50 | 30 days supply | Qty: 30 | Fill #1

## 2017-07-29 ENCOUNTER — Encounter: Payer: Self-pay | Admitting: Internal Medicine

## 2017-07-29 ENCOUNTER — Ambulatory Visit (INDEPENDENT_AMBULATORY_CARE_PROVIDER_SITE_OTHER): Payer: Medicare Other | Admitting: Internal Medicine

## 2017-07-29 VITALS — BP 164/88 | HR 60 | Temp 98.2°F | Wt 137.0 lb

## 2017-07-29 DIAGNOSIS — B20 Human immunodeficiency virus [HIV] disease: Secondary | ICD-10-CM

## 2017-07-29 DIAGNOSIS — Z7189 Other specified counseling: Secondary | ICD-10-CM

## 2017-07-29 DIAGNOSIS — I63312 Cerebral infarction due to thrombosis of left middle cerebral artery: Secondary | ICD-10-CM

## 2017-07-29 DIAGNOSIS — Z7185 Encounter for immunization safety counseling: Secondary | ICD-10-CM

## 2017-07-29 NOTE — Progress Notes (Signed)
   Subjective:    Patient ID: Hazle NordmannWilliam Schreffler, male    DOB: 04/02/1946, 71 y.o.   MRN: [id number]  HPI Here for follow up of HIV.  No new issues with his HIV and continues on Tivicay and Descovy and tolerating well.  On atorvastatin for CVA history.  Denies any missed doses.  No associated n/v/d.  No new issues.  No weight loss.  No other complaints.     Review of Systems  Constitutional: Negative for fatigue.  Gastrointestinal: Negative for diarrhea and nausea.  Skin: Negative for rash.  Neurological: Negative for dizziness.       Objective:   Physical Exam  Constitutional: He appears well-developed and well-nourished. No distress.  HENT:  Mouth/Throat: No oropharyngeal exudate.  Eyes: No scleral icterus.  Cardiovascular: Normal rate, regular rhythm and normal heart sounds.  Pulmonary/Chest: Effort normal and breath sounds normal. No respiratory distress.  Lymphadenopathy:    He has no cervical adenopathy.  Skin: No rash noted.   SH: not sexually active        Assessment & Plan:

## 2017-07-29 NOTE — Assessment & Plan Note (Signed)
Offered flu shot and refused.  Counseled.

## 2017-07-29 NOTE — Assessment & Plan Note (Signed)
Doing well and will change to biktarvy for ease of use.  rtc 6 months if labs ok

## 2017-07-30 LAB — T-HELPER CELL (CD4) - (RCID CLINIC ONLY)
CD4 % Helper T Cell: 12 % — ABNORMAL LOW (ref 33–55)
CD4 T CELL ABS: 260 /uL — AB (ref 400–2700)

## 2017-07-31 ENCOUNTER — Other Ambulatory Visit: Payer: Self-pay | Admitting: Pharmacist Clinician (PhC)/ Clinical Pharmacy Specialist

## 2017-07-31 LAB — HIV-1 RNA QUANT-NO REFLEX-BLD
HIV 1 RNA QUANT: DETECTED {copies}/mL — AB
HIV-1 RNA Quant, Log: <1.3 Log copies/mL — AB

## 2017-07-31 MED ORDER — BICTEGRAVIR-EMTRICITAB-TENOFOV 50-200-25 MG PO TABS: 1.0000 | ORAL_TABLET | Freq: Every day | ORAL | 6 refills | Status: DC

## 2017-07-31 MED FILL — BIKTARVY 50-200-25 MG TABS: 50-200-25 | 30 days supply | Qty: 30 | Fill #0

## 2017-07-31 NOTE — Progress Notes (Unsigned)
Sending [employer]

## 2017-09-03 MED FILL — BIKTARVY 50-200-25 MG TABS: 50-200-25 | 30 days supply | Qty: 30 | Fill #1

## 2017-09-16 ENCOUNTER — Other Ambulatory Visit: Payer: Self-pay | Admitting: *Deleted

## 2017-09-16 MED ORDER — LISINOPRIL-HYDROCHLOROTHIAZIDE 20-12.5 MG PO TABS: 2.0000 | ORAL_TABLET | Freq: Every day | ORAL | 1 refills | Status: DC

## 2017-10-01 ENCOUNTER — Other Ambulatory Visit: Payer: Self-pay

## 2017-10-01 ENCOUNTER — Ambulatory Visit (INDEPENDENT_AMBULATORY_CARE_PROVIDER_SITE_OTHER): Payer: Medicare Other | Admitting: Internal Medicine

## 2017-10-01 ENCOUNTER — Encounter: Payer: Self-pay | Admitting: Internal Medicine

## 2017-10-01 VITALS — BP 137/81 | HR 72 | Temp 98.1°F | Ht 69.0 in | Wt 158.4 lb

## 2017-10-01 DIAGNOSIS — H9311 Tinnitus, right ear: Secondary | ICD-10-CM

## 2017-10-01 DIAGNOSIS — Z79899 Other long term (current) drug therapy: Secondary | ICD-10-CM | POA: Diagnosis not present

## 2017-10-01 DIAGNOSIS — Z0000 Encounter for general adult medical examination without abnormal findings: Secondary | ICD-10-CM

## 2017-10-01 DIAGNOSIS — I10 Essential (primary) hypertension: Secondary | ICD-10-CM

## 2017-10-01 DIAGNOSIS — B20 Human immunodeficiency virus [HIV] disease: Secondary | ICD-10-CM | POA: Diagnosis not present

## 2017-10-01 DIAGNOSIS — H6123 Impacted cerumen, bilateral: Secondary | ICD-10-CM

## 2017-10-01 DIAGNOSIS — Z8673 Personal history of transient ischemic attack (TIA), and cerebral infarction without residual deficits: Secondary | ICD-10-CM

## 2017-10-01 MED ORDER — CARBAMIDE PEROXIDE 6.5 % OT SOLN: 5.0000 [drp] | Freq: Two times a day (BID) | OTIC | 0 refills | Status: AC

## 2017-10-01 MED FILL — BIKTARVY 50-200-25 MG TABS: 50-200-25 | 30 days supply | Qty: 30 | Fill #2

## 2017-10-01 NOTE — Progress Notes (Signed)
Internal Medicine Clinic Attending  Case discussed with Dr. Patel  soon after the resident saw the patient.  We reviewed the resident's history and exam and pertinent patient test results.  I agree with the assessment, diagnosis, and plan of care documented in the resident's note.  Alexander N Raines, MD   

## 2017-10-01 NOTE — Assessment & Plan Note (Addendum)
Mr. Pricilla Holmucker is currently prescribed Lisinopril-HCTZ 40-25 mg daily and Amlodipine 5 mg daily. He reports adherence and tolerance. BP Readings from Last 3 Encounters:  10/01/17 137/81  07/29/17 (!) 164/88  02/26/17 127/78  A/P: BP is stable and well-controlled. Will continue current management. Will repeat BMET today.

## 2017-10-01 NOTE — Patient Instructions (Addendum)
It was a pleasure to see you again Thomas [name].  Please continue your medications as prescribed.  We are checking blood work today. I will call you with the results and if we need to adjust your medications.  Use the ear drops twice a day for 5 days in both ears to clear the ear wax.  Please follow up with us again in 6 months or sooner if needed.

## 2017-10-01 NOTE — Progress Notes (Signed)
   CC: HTN  HPI:  [name] is a 72 y.o. male with PMH as listed below including HTN, Hx of CVA, and HIV who presents for follow up management of HTN. Please see problem based charting for status of patient's chronic medical issues.  Essential hypertension [name] is currently prescribed Lisinopril-HCTZ 40-25 mg daily and Amlodipine 5 mg daily. He reports adherence and tolerance. BP Readings from Last 3 Encounters:  10/01/17 137/81  07/29/17 (!) 164/88  02/26/17 127/78  A/P: BP is stable and well-controlled. Will continue current management. Will repeat BMET today.  HIV disease (HCC) Follows with Infectious Disease. He is currently taking Biktarvy. Last CD4 260 and HIV RNA undetectable on 07/29/17. He reports tolerance without obvious side effects. A/P: Currently well-controlled. Continue Biktarvy per ID.  Excessive cerumen in ear canal, bilateral He is a former [employer] with the [employer]. 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  A/P: On exam he has dry cerumen in both ear canals. Will try debrox for clearance. This may provide some relief of his tinnitus but doubt will completely resolve his chronic issue. We can offer Audiology testing in the future if he is agreeable.  Healthcare maintenance He is counseled on influenza vaccine however declines this visit.   Past Medical History:  Diagnosis Date  . 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  Recommend 1 year follow up with CT or MRI.   . Enlarged thyroid 04/22/2015   Seen on CTA 04/21/15: Contains multiple nodules measuring up to 2.1 cm in size, TSH wnl.  Needs thyroid ultrasound and biopsy  . Heart murmur   . HIV (human immunodeficiency virus infection) (HCC)   . HTN (hypertension)   . Immune deficiency disorder (HCC)   . Stroke [facility](HCC)    Review of  Systems:   Review of Systems  Constitutional: Negative for chills, diaphoresis and fever.  HENT: Positive for tinnitus.   Respiratory: Negative for hemoptysis and shortness of breath.   Cardiovascular: Negative for chest pain, palpitations and leg swelling.  Gastrointestinal: Negative for abdominal pain, blood in stool and melena.  Genitourinary: Negative for dysuria and hematuria.  Neurological: Negative for sensory change and focal weakness.     Physical Exam:  Vitals:   10/01/17 1330  BP: 137/81  Pulse: 72  Temp: 98.1 F (36.7 C)  TempSrc: Oral  SpO2: 99%  Weight: 158 lb 6.4 oz (71.8 kg)  Height: 5\' 9"  (1.753 m)   Physical Exam  Constitutional: He is oriented to person, place, and time. He appears well-developed and well-nourished. No distress.  HENT:  Head: Normocephalic and atraumatic.  Mouth/Throat: Oropharynx is clear and moist.  Dry cerumen bilateral ears partially obstructing ear canal.  Neck: No thyromegaly present.  Cardiovascular: Normal rate and regular rhythm.  Pulmonary/Chest: Effort normal. No respiratory distress. He has no wheezes. He has no rales.  Neurological: He is alert and oriented to person, place, and time.  Strength RUE slightly decreased compared to LUE otherwise 5/5. Strength 5/5 b/l LEs.  Skin: He is not diaphoretic.    Assessment & Plan:   See Encounters Tab for problem based charting.  Patient discussed with Dr. Sandre Kittyaines

## 2017-10-01 NOTE — Assessment & Plan Note (Addendum)
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  A/P: On exam he has dry cerumen in both ear canals. Will try debrox for clearance. This may provide some relief of his tinnitus but doubt will completely resolve his chronic issue. We can offer Audiology testing in the future if he is agreeable.

## 2017-10-01 NOTE — Assessment & Plan Note (Addendum)
Follows with Infectious Disease. He is currently taking Biktarvy. Last CD4 260 and HIV RNA undetectable on 07/29/17. He reports tolerance without obvious side effects. A/P: Currently well-controlled. Continue Biktarvy per ID.

## 2017-10-01 NOTE — Assessment & Plan Note (Signed)
He is counseled on influenza vaccine however declines this visit.

## 2017-10-02 LAB — BMP8+ANION GAP
Anion Gap: 14 mmol/L (ref 10.0–18.0)
BUN/Creatinine Ratio: 16 (ref 10–24)
BUN: 20 mg/dL (ref 8–27)
CO2: 28 mmol/L (ref 20–29)
Calcium: 9.8 mg/dL (ref 8.6–10.2)
Chloride: 101 mmol/L (ref 96–106)
Creatinine, Ser: 1.26 mg/dL (ref 0.76–1.27)
GFR calc Af Amer: 65 mL/min/{1.73_m2} (ref >59)
GFR calc non Af Amer: 57 mL/min/{1.73_m2} — ABNORMAL LOW (ref >59)
Glucose: 114 mg/dL — ABNORMAL HIGH (ref 65–99)
Potassium: 4.3 mmol/L (ref 3.5–5.2)
Sodium: 143 mmol/L (ref 134–144)

## 2017-10-29 MED FILL — BIKTARVY 50-200-25 MG TABS: 50-200-25 | 30 days supply | Qty: 30 | Fill #3

## 2017-11-11 ENCOUNTER — Other Ambulatory Visit: Payer: Self-pay | Admitting: *Deleted

## 2017-11-11 DIAGNOSIS — I10 Essential (primary) hypertension: Secondary | ICD-10-CM

## 2017-11-12 MED ORDER — AMLODIPINE BESYLATE 5 MG PO TABS: 5.0000 mg | ORAL_TABLET | Freq: Every day | ORAL | 1 refills | Status: DC

## 2017-11-24 MED FILL — BIKTARVY 50-200-25 MG TABS: 50-200-25 | 30 days supply | Qty: 30 | Fill #4

## 2017-12-18 MED FILL — BIKTARVY 50-200-25 MG TABS: 50-200-25 | 30 days supply | Qty: 30 | Fill #5

## 2018-01-12 MED FILL — BIKTARVY 50-200-25 MG TABS: 50-200-25 | 30 days supply | Qty: 30 | Fill #6

## 2018-01-27 ENCOUNTER — Encounter: Payer: Self-pay | Admitting: Internal Medicine

## 2018-01-27 ENCOUNTER — Ambulatory Visit (INDEPENDENT_AMBULATORY_CARE_PROVIDER_SITE_OTHER): Payer: Medicare Other | Admitting: Internal Medicine

## 2018-01-27 VITALS — BP 126/78 | HR 70 | Temp 98.4°F | Ht 69.0 in | Wt 156.8 lb

## 2018-01-27 DIAGNOSIS — Z113 Encounter for screening for infections with a predominantly sexual mode of transmission: Secondary | ICD-10-CM

## 2018-01-27 DIAGNOSIS — E7849 Other hyperlipidemia: Secondary | ICD-10-CM

## 2018-01-27 DIAGNOSIS — N289 Disorder of kidney and ureter, unspecified: Secondary | ICD-10-CM | POA: Diagnosis not present

## 2018-01-27 DIAGNOSIS — N183 Chronic kidney disease, stage 3 unspecified: Secondary | ICD-10-CM | POA: Insufficient documentation

## 2018-01-27 DIAGNOSIS — B20 Human immunodeficiency virus [HIV] disease: Secondary | ICD-10-CM

## 2018-01-27 NOTE — Assessment & Plan Note (Signed)
Last check was wnl.  Will recheck today.

## 2018-01-27 NOTE — Assessment & Plan Note (Signed)
Will screen today 

## 2018-01-27 NOTE — Progress Notes (Signed)
   Subjective:    Patient ID: Avi Kerschner, male    DOB: 02-17-1946, 72 y.o.   MRN: [id number]  HPI Here for follow up of HIV.  Remains on Tivicay and Descovy and tolerating well. No missed doses and no associated n/v/d.  Some occasional mild abdominal discomfort.  Not associated with food, no associated weight loss, no diarrhea.    Review of Systems  Gastrointestinal: Negative for abdominal distention, blood in stool, constipation, diarrhea and nausea.  Skin: Negative for rash.       Objective:   Physical Exam  Constitutional: He appears well-developed and well-nourished. No distress.  HENT:  Mouth/Throat: No oropharyngeal exudate.  Eyes: No scleral icterus.  Cardiovascular: Normal rate, regular rhythm and normal heart sounds.  Pulmonary/Chest: Effort normal and breath sounds normal. No respiratory distress.  Lymphadenopathy:    He has no cervical adenopathy.  Skin: No rash noted.   SH: not sexually active        Assessment & Plan:

## 2018-01-27 NOTE — Assessment & Plan Note (Signed)
Doing well.  Labs today and rtc 6 months.  

## 2018-01-27 NOTE — Assessment & Plan Note (Signed)
I will check his lipid panel with his labs.

## 2018-01-28 LAB — CBC WITH DIFFERENTIAL/PLATELET
BASOS ABS: 22 {cells}/uL (ref 0–200)
Basophils Relative: 0.5 %
EOS ABS: 39 {cells}/uL (ref 15–500)
Eosinophils Relative: 0.9 %
HEMATOCRIT: 42.1 % (ref 38.5–50.0)
HEMOGLOBIN: 14.7 g/dL (ref 13.2–17.1)
LYMPHS ABS: 1733 {cells}/uL (ref 850–3900)
MCH: 29.2 pg (ref 27.0–33.0)
MCHC: 34.9 g/dL (ref 32.0–36.0)
MCV: 83.7 fL (ref 80.0–100.0)
MONOS PCT: 6.5 %
MPV: 10.6 fL (ref 7.5–12.5)
NEUTROS ABS: 2227 {cells}/uL (ref 1500–7800)
NEUTROS PCT: 51.8 %
Platelets: 140 10*3/uL (ref 140–400)
RBC: 5.03 10*6/uL (ref 4.20–5.80)
RDW: 13.2 % (ref 11.0–15.0)
Total Lymphocyte: 40.3 %
WBC mixed population: 280 cells/uL (ref 200–950)
WBC: 4.3 10*3/uL (ref 3.8–10.8)

## 2018-01-28 LAB — COMPLETE METABOLIC PANEL WITH GFR
AG RATIO: 1.3 (calc) (ref 1.0–2.5)
ALBUMIN MSPROF: 3.9 g/dL (ref 3.6–5.1)
ALT: 14 U/L (ref 9–46)
AST: 19 U/L (ref 10–35)
Alkaline phosphatase (APISO): 86 U/L (ref 40–115)
BILIRUBIN TOTAL: 0.8 mg/dL (ref 0.2–1.2)
BUN / CREAT RATIO: 13 (calc) (ref 6–22)
BUN: 18 mg/dL (ref 7–25)
CALCIUM: 9 mg/dL (ref 8.6–10.3)
CO2: 33 mmol/L — ABNORMAL HIGH (ref 20–32)
Chloride: 102 mmol/L (ref 98–110)
Creat: 1.42 mg/dL — ABNORMAL HIGH (ref 0.70–1.18)
GFR, EST AFRICAN AMERICAN: 57 mL/min/{1.73_m2} — AB (ref >=60)
GFR, EST NON AFRICAN AMERICAN: 49 mL/min/{1.73_m2} — AB (ref >=60)
Globulin: 2.9 g/dL (calc) (ref 1.9–3.7)
Glucose, Bld: 89 mg/dL (ref 65–99)
POTASSIUM: 3.6 mmol/L (ref 3.5–5.3)
Sodium: 140 mmol/L (ref 135–146)
TOTAL PROTEIN: 6.8 g/dL (ref 6.1–8.1)

## 2018-01-28 LAB — RPR: RPR: NONREACTIVE

## 2018-01-28 LAB — LIPID PANEL
CHOL/HDL RATIO: 2.4 (calc) (ref <5.0)
CHOLESTEROL: 100 mg/dL (ref <200)
HDL: 42 mg/dL (ref >40)
LDL Cholesterol (Calc): 47 mg/dL (calc)
Non-HDL Cholesterol (Calc): 58 mg/dL (calc) (ref <130)
Triglycerides: 39 mg/dL (ref <150)

## 2018-01-28 LAB — T-HELPER CELL (CD4) - (RCID CLINIC ONLY)
CD4 % Helper T Cell: 14 % — ABNORMAL LOW (ref 33–55)
CD4 T CELL ABS: 240 /uL — AB (ref 400–2700)

## 2018-01-29 LAB — HIV-1 RNA QUANT-NO REFLEX-BLD
HIV 1 RNA Quant: <20 copies/mL
HIV-1 RNA QUANT, LOG: NOT DETECTED {Log_copies}/mL

## 2018-02-04 ENCOUNTER — Other Ambulatory Visit: Payer: Self-pay | Admitting: Internal Medicine

## 2018-02-06 MED FILL — BIKTARVY 50-200-25 MG TABS: 50-200-25 | 30 days supply | Qty: 30 | Fill #0

## 2018-03-03 MED FILL — BIKTARVY 50-200-25 MG TABS: 50-200-25 | 30 days supply | Qty: 30 | Fill #1

## 2018-03-15 ENCOUNTER — Other Ambulatory Visit: Payer: Self-pay | Admitting: Internal Medicine

## 2018-03-24 ENCOUNTER — Encounter: Payer: Self-pay | Admitting: *Deleted

## 2018-03-30 MED FILL — BIKTARVY 50-200-25 MG TABS: 50-200-25 | 30 days supply | Qty: 30 | Fill #2

## 2018-04-22 ENCOUNTER — Encounter: Payer: Medicare Other | Admitting: Internal Medicine

## 2018-04-27 ENCOUNTER — Other Ambulatory Visit: Payer: Self-pay | Admitting: Internal Medicine

## 2018-04-27 DIAGNOSIS — I10 Essential (primary) hypertension: Secondary | ICD-10-CM

## 2018-04-28 MED FILL — BIKTARVY 50-200-25 MG TABS: 50-200-25 | 30 days supply | Qty: 30 | Fill #3

## 2018-05-29 ENCOUNTER — Ambulatory Visit (INDEPENDENT_AMBULATORY_CARE_PROVIDER_SITE_OTHER): Payer: Medicare Other | Admitting: Internal Medicine

## 2018-05-29 ENCOUNTER — Other Ambulatory Visit: Payer: Self-pay

## 2018-05-29 VITALS — BP 116/74 | HR 57 | Temp 98.3°F | Ht 69.0 in | Wt 152.0 lb

## 2018-05-29 DIAGNOSIS — Z8673 Personal history of transient ischemic attack (TIA), and cerebral infarction without residual deficits: Secondary | ICD-10-CM

## 2018-05-29 DIAGNOSIS — Z0000 Encounter for general adult medical examination without abnormal findings: Secondary | ICD-10-CM

## 2018-05-29 DIAGNOSIS — H9311 Tinnitus, right ear: Secondary | ICD-10-CM | POA: Diagnosis not present

## 2018-05-29 DIAGNOSIS — E7849 Other hyperlipidemia: Secondary | ICD-10-CM

## 2018-05-29 DIAGNOSIS — I10 Essential (primary) hypertension: Secondary | ICD-10-CM | POA: Diagnosis not present

## 2018-05-29 DIAGNOSIS — Z7982 Long term (current) use of aspirin: Secondary | ICD-10-CM

## 2018-05-29 DIAGNOSIS — E785 Hyperlipidemia, unspecified: Secondary | ICD-10-CM | POA: Diagnosis not present

## 2018-05-29 DIAGNOSIS — B20 Human immunodeficiency virus [HIV] disease: Secondary | ICD-10-CM | POA: Diagnosis not present

## 2018-05-29 DIAGNOSIS — E042 Nontoxic multinodular goiter: Secondary | ICD-10-CM | POA: Diagnosis not present

## 2018-05-29 DIAGNOSIS — Z79899 Other long term (current) drug therapy: Secondary | ICD-10-CM | POA: Diagnosis not present

## 2018-05-29 DIAGNOSIS — N289 Disorder of kidney and ureter, unspecified: Secondary | ICD-10-CM | POA: Diagnosis not present

## 2018-05-29 DIAGNOSIS — E049 Nontoxic goiter, unspecified: Secondary | ICD-10-CM

## 2018-05-29 DIAGNOSIS — H6123 Impacted cerumen, bilateral: Secondary | ICD-10-CM

## 2018-05-29 DIAGNOSIS — H9319 Tinnitus, unspecified ear: Secondary | ICD-10-CM | POA: Insufficient documentation

## 2018-05-29 DIAGNOSIS — I63312 Cerebral infarction due to thrombosis of left middle cerebral artery: Secondary | ICD-10-CM

## 2018-05-29 MED ORDER — CARBAMIDE PEROXIDE 6.5 % OT SOLN: 5.0000 [drp] | Freq: Two times a day (BID) | OTIC | 0 refills | Status: AC

## 2018-05-29 MED FILL — BIKTARVY 50-200-25 MG TABS: 50-200-25 | 30 days supply | Qty: 30 | Fill #4

## 2018-05-29 NOTE — Assessment & Plan Note (Signed)
Assessment 1. He had a negative Fecal immunochemical test on 03/19/18. 2. Influenza vaccine due today.  Plan: 1. Repeat Fecal immunochemical test today. If positive, consider colonoscopy.  2. Patient left prior to receiving flu shot. Will plan on giving him his flu vaccine at 1 week follow-up.

## 2018-05-29 NOTE — Assessment & Plan Note (Addendum)
Assessment: Mr. Franzel has a several year history of renal insufficiency dating back to 2016. His baseline seems to be between 1.3-1.4. His last creatinine on 01/27/18 was 1.42; GFR 57.    Plan: 1. Repeat BMP today  Addendum 06/01/18 Repeat creatinine 1.28 which is back to baseline. No further work-up needed at this time.

## 2018-05-29 NOTE — Assessment & Plan Note (Signed)
Assessment Mr. Mottola was found to have dry cerumen in both ear canals at his last clinic visit in January. He was asked to use debrox but did not pick these drops up. Today he was found to have a large amount of dry cerumen in both ear canals. I believe that it is reasonable to treat his impacted ears with Debrox for 5 days. I will have him follow-up in 1 week to determine whether his ear canals have cleared. If they have not, will consider irrigation of boths ears to disimpact the ear canald.  Plan: 1. Prescribed Debrox 5 drops in each ear BID for 5 days 2. Follow-up in clinic in 7 days to re-evaluate cerumen impaction and consider irrigation of each canal to remove any remaining cerumen.

## 2018-05-29 NOTE — Assessment & Plan Note (Signed)
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 During his last clinic visit on 10/01/17 he was found to have dry cerumen in both ear canal. He was asked to use debrox but did not pick these drops up. Today he was found to have a large amount of dry cerumen in both ear canals and continues to have right ear tinnitus. I believe that it is reasonable to treat his impacted ears with Debrox for 5 days. I will have him follow-up in 1 week to determine whether his ear canals have cleared. If they have not, will consider irrigation of boths ears to disimpact the ear canald. 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 He will likely need referral to ENT or audiologist for audiometric tests (will hold off till after we clear his cerumen impaction).  Plan: 1. Prescribed Debrox 5 drops in each ear BID for 5 days 2. Follow-up in clinic in 7 days to re-evaluate cerumen impaction and consider irrigation of each canal to remove any remaining cerumen. 3. Consider referral for audiometric tests at next clinic visit.

## 2018-05-29 NOTE — Assessment & Plan Note (Signed)
Assessment: Currently taking atorvastatin 40 mg QD. He is having no issues with this medication.   Plan: 1. Continue atorvastatin 40 mg QD.

## 2018-05-29 NOTE — Assessment & Plan Note (Signed)
Assessment: On aspirin 325 mg QD and atorvastatin 40 mg QD per above. He is having no issues with these medications.   Plan: 1. Continue aspirin 325 mg QD 2. Continue atorvastatin 40 mg QD

## 2018-05-29 NOTE — Assessment & Plan Note (Signed)
Assessment: Undetectable viral load on Bikarvy. CD4 count remains low but stable at 240. Next ID clinic appointment on 08/03/18.  Plan: 1. Continue Biktarvy per ID recommendations.

## 2018-05-29 NOTE — Assessment & Plan Note (Signed)
Assessment: Seen on CTA on 04/21/15. 2017 FNA: Benign (per path report) right thyroid nodule measuring 2.6 x 1.2 x 2.4 cm.  Plan: 1. No further work-up at this time.

## 2018-05-29 NOTE — Assessment & Plan Note (Signed)
Assessment: On amlodipine 5 mg QD and lisinopril/HCTZ 20-12.5 mg QD. His blood pressure is well controlled today at 116/74.  Plan: 1. Continue Amlodipine 5 mg QD 2. Continue lisinopril/HCTZ 20-12.5 mg QD

## 2018-05-29 NOTE — Patient Instructions (Signed)
I prescribed Debrox (carbamide peroxide) for your ears. Please use 5 drops in each ear twice a day for 5 days. I will have you follow-up in 1 week to determine whether we need to irrigate your ears to get the ear wax out.  I will call you with the results of your blood work and fecal test.

## 2018-05-29 NOTE — Progress Notes (Signed)
CC: establish care with new PCP  HPI:  [name] is a 72 y.o. male with HTN, multi-nodular thyroid, renal insufficiency, HIV, HLD, hx of CVA who presents to establish care with new PCP.  Tinnitus 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 During his last clinic visit on 10/01/17 he was found to have dry cerumen in both ear canal. He was asked to use debrox but states that he never received this. He continues to have tinnitus of the right ear which has remained stable throughout the last several months. He denies ear pain, ear discharge, and hearing loss  HTN On amlodipine 5 mg QD and lisinopril/HCTZ 20-12.5 mg QD. His blood pressure is well controlled today at 116/74.  Multi-nodular thyroid Seen on CTA on 04/21/15. 2017 FNA: Benign (per path report) right thyroid nodule measuring 2.6 x 1.2 x 2.4 cm.  HIV  Undetectable viral load on Bikarvy. Next ID clinic appointment on 08/03/18.  Lab Results  Component Value Date   HIV1RNAQUANT <20 NOT DETECTED 01/27/2018   HIV1RNAQUANT <20 DETECTED (A) 07/29/2017   HIV1RNAQUANT <20 DETECTED (A) 12/31/2016   Lab Results  Component Value Date   CD4TABS 240 (L) 01/27/2018   CD4TABS 260 (L) 07/29/2017   CD4TABS 220 (L) 12/31/2016   HLD Currently taking atorvastatin 40 mg QD. He is having no issues with this medication.   Renal Insufficiency [name] has a several year history of renal insufficiency dating back to 2016. His baseline seems to be between 1.3-1.4. His last creatinine on 01/27/18 was 1.42; GFR 57.    Hx of CVA On aspirin 325 mg QD and atorvastatin 40 mg QD per above. He is having no issues with these medications.   Health Maintenance - He had a negative Fecal immunochemical test on 03/19/18. - Influenza vaccine due today.  Past Medical History:  Diagnosis Date  . 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  Recommend 1 year follow up with CT or MRI.   . Enlarged thyroid 04/22/2015   Seen on CTA 04/21/15: Contains multiple nodules measuring up to 2.1 cm in size, TSH wnl.  Needs thyroid ultrasound and biopsy  . Heart murmur   . HIV (human immunodeficiency virus infection) (HCC)   . HTN (hypertension)   . Immune deficiency disorder (HCC)   . Stroke [facility])    Review of Systems:   Review of Systems  Constitutional: Negative for chills and fever.  HENT: Positive for tinnitus. Negative for ear discharge, ear pain and hearing loss.   Respiratory: Negative for cough and shortness of breath.   Cardiovascular: Negative for chest pain, palpitations and leg swelling.  Gastrointestinal: Negative for abdominal pain, diarrhea, nausea and vomiting.  Genitourinary: Negative for dysuria and hematuria.  Neurological: Negative for dizziness and headaches.  All other systems reviewed and are negative.   Physical Exam:  Vitals:   05/29/18 0915  BP: 116/74  Pulse: (!) 57  Temp: 98.3 F (36.8 C)  TempSrc: Oral  SpO2: 100%  Weight: 152 lb (68.9 kg)  Height: 5\' 9"  (1.753 m)   Physical Exam  Constitutional: He appears well-developed and well-nourished.  HENT:  Head: Normocephalic and atraumatic.  Dry cerumen in both ear canals obstructing the view to the tympanic  membrane.  Eyes: EOM are normal.  Cardiovascular: Normal rate and regular rhythm.  Pulmonary/Chest: Effort normal and breath sounds normal.  Abdominal: Bowel sounds are normal.  Neurological: He is alert.  Skin: Skin is warm and dry.  Psychiatric: He has a normal mood and affect. His behavior is normal.  Nursing note and vitals reviewed.   Assessment & Plan:   See Encounters Tab for problem based charting.  Patient seen with Dr. Heide Spark

## 2018-05-29 NOTE — Progress Notes (Signed)
Internal Medicine Clinic Attending  I saw and evaluated the patient.  I personally confirmed the key portions of the history and exam documented by Dr. Prince and I reviewed pertinent patient test results.  The assessment, diagnosis, and plan were formulated together and I agree with the documentation in the resident's note.  

## 2018-05-30 LAB — BMP8+ANION GAP
Anion Gap: 13 mmol/L (ref 10.0–18.0)
BUN/Creatinine Ratio: 8 — ABNORMAL LOW (ref 10–24)
BUN: 10 mg/dL (ref 8–27)
CALCIUM: 9.1 mg/dL (ref 8.6–10.2)
CHLORIDE: 101 mmol/L (ref 96–106)
CO2: 28 mmol/L (ref 20–29)
Creatinine, Ser: 1.28 mg/dL — ABNORMAL HIGH (ref 0.76–1.27)
GFR calc non Af Amer: 56 mL/min/{1.73_m2} — ABNORMAL LOW (ref >59)
GFR, EST AFRICAN AMERICAN: 64 mL/min/{1.73_m2} (ref >59)
GLUCOSE: 95 mg/dL (ref 65–99)
POTASSIUM: 3.6 mmol/L (ref 3.5–5.2)
SODIUM: 142 mmol/L (ref 134–144)

## 2018-06-01 ENCOUNTER — Telehealth: Payer: Self-pay | Admitting: Internal Medicine

## 2018-06-01 NOTE — Telephone Encounter (Signed)
BMP unremarkable. Creatinine downtrended to 1.28 (now at baseline). No further work-up needed. I attempted to call [name] to let him know of his results but did not get an answer. I will attempt to call again later on this week.

## 2018-06-10 MED FILL — BIKTARVY 50-200-25 MG TABS: 50-200-25 | 30 days supply | Qty: 30 | Fill #5

## 2018-06-12 ENCOUNTER — Other Ambulatory Visit: Payer: Self-pay | Admitting: Internal Medicine

## 2018-06-24 ENCOUNTER — Other Ambulatory Visit: Payer: Self-pay | Admitting: *Deleted

## 2018-06-24 DIAGNOSIS — E7849 Other hyperlipidemia: Secondary | ICD-10-CM

## 2018-06-25 MED ORDER — ATORVASTATIN CALCIUM 40 MG PO TABS: ORAL_TABLET | ORAL | 3 refills | Status: DC

## 2018-07-06 MED FILL — BIKTARVY 50-200-25 MG TABS: 50-200-25 | 30 days supply | Qty: 30 | Fill #6

## 2018-07-08 ENCOUNTER — Telehealth: Payer: Self-pay | Admitting: Internal Medicine

## 2018-07-08 NOTE — Telephone Encounter (Signed)
Pt missed call, please call back [phone]

## 2018-07-08 NOTE — Telephone Encounter (Signed)
rtc to pt, I cannot find any calls from anyone recently. Ask pt if he needed anything, he said no, ended call

## 2018-07-27 ENCOUNTER — Other Ambulatory Visit: Payer: Self-pay | Admitting: Internal Medicine

## 2018-08-03 ENCOUNTER — Ambulatory Visit: Payer: Medicare Other | Admitting: Internal Medicine

## 2018-08-03 MED FILL — BIKTARVY 50-200-25 MG TABS: 50-200-25 | 30 days supply | Qty: 30 | Fill #0

## 2018-08-27 MED FILL — BIKTARVY 50-200-25 MG TABS: 50-200-25 | 30 days supply | Qty: 30 | Fill #1

## 2018-09-14 ENCOUNTER — Other Ambulatory Visit: Payer: Self-pay | Admitting: Internal Medicine

## 2018-09-22 MED FILL — BIKTARVY 50-200-25 MG TABS: 50-200-25 | 30 days supply | Qty: 30 | Fill #2

## 2018-10-21 ENCOUNTER — Ambulatory Visit (INDEPENDENT_AMBULATORY_CARE_PROVIDER_SITE_OTHER): Payer: Medicare Other | Admitting: Internal Medicine

## 2018-10-21 ENCOUNTER — Other Ambulatory Visit: Payer: Self-pay

## 2018-10-21 ENCOUNTER — Encounter: Payer: Self-pay | Admitting: Internal Medicine

## 2018-10-21 VITALS — BP 134/79 | HR 63 | Temp 99.1°F | Ht 69.0 in | Wt 163.4 lb

## 2018-10-21 DIAGNOSIS — B20 Human immunodeficiency virus [HIV] disease: Secondary | ICD-10-CM

## 2018-10-21 DIAGNOSIS — Z0000 Encounter for general adult medical examination without abnormal findings: Secondary | ICD-10-CM | POA: Diagnosis not present

## 2018-10-21 DIAGNOSIS — I10 Essential (primary) hypertension: Secondary | ICD-10-CM

## 2018-10-21 DIAGNOSIS — H6123 Impacted cerumen, bilateral: Secondary | ICD-10-CM

## 2018-10-21 DIAGNOSIS — H9311 Tinnitus, right ear: Secondary | ICD-10-CM

## 2018-10-21 MED ORDER — CARBAMIDE PEROXIDE 6.5 % OT SOLN: 5.0000 [drp] | Freq: Two times a day (BID) | OTIC | 0 refills | Status: AC

## 2018-10-21 NOTE — Progress Notes (Signed)
   CC: Routine follow-up visit  HPI:  [name] is a 73 y.o. male with hypertension, multinodular thyroid, CKD, HIV, HLD, history of CVA who presents for routine follow-up visit.  Essential hypertension: BP today 134/79 on lisinopril-HCTZ 40-25 mg QD and amlodipine 5 mg QD.  HIV: On Biktarvy.  Reports good compliance.  Tinnitus: Continues to have right ear tinnitus.  He describes it as a "constant ringing sound" it does not go away throughout the day.  He denies any other symptoms including headache, ear pain, or pulsatile tinnitus.  He was found to have impacted cerumen of both ears at his last clinic visit.  I prescribed Debrox to soften up the impaction but he denies starting this.  Preventative medicine: He is due for colon cancer screening.  Fit test ordered last visit.  He did not pick this up.   Past Medical History:  Diagnosis Date  . 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  Recommend 1 year follow up with CT or MRI.   . Enlarged thyroid 04/22/2015   Seen on CTA 04/21/15: Contains multiple nodules measuring up to 2.1 cm in size, TSH wnl.  Needs thyroid ultrasound and biopsy  . Heart murmur   . HIV (human immunodeficiency virus infection) (HCC)   . HTN (hypertension)   . Immune deficiency disorder (HCC)   . Stroke [facility])    Review of Systems:   Review of Systems  Constitutional: Negative for chills and fever.  Respiratory: Negative for cough and shortness of breath.   Cardiovascular: Negative for chest pain, palpitations and leg swelling.  Gastrointestinal: Negative for abdominal pain, diarrhea, nausea and vomiting.  Genitourinary: Negative for dysuria, frequency, hematuria and urgency.  Neurological: Negative for dizziness, weakness and headaches.  All other systems reviewed and are negative.   Physical Exam:  Vitals:   10/21/18 1415  BP: 134/79  Pulse: 63    Temp: 99.1 F (37.3 C)  TempSrc: Oral  SpO2: 97%  Weight: 163 lb 6.4 oz (74.1 kg)  Height: 5\' 9"  (1.753 m)   Physical Exam Vitals signs reviewed.  Constitutional:      Appearance: Normal appearance.  HENT:     Head: Normocephalic and atraumatic.     Right Ear: External ear normal. There is impacted cerumen.     Left Ear: External ear normal. There is impacted cerumen.  Cardiovascular:     Rate and Rhythm: Normal rate and regular rhythm.     Pulses: Normal pulses.     Heart sounds: Normal heart sounds.  Pulmonary:     Effort: Pulmonary effort is normal. No respiratory distress.     Breath sounds: Normal breath sounds.  Abdominal:     General: Abdomen is flat. There is no distension.     Palpations: Abdomen is soft.  Musculoskeletal:     Right lower leg: No edema.     Left lower leg: No edema.  Skin:    General: Skin is warm and dry.  Neurological:     Mental Status: He is alert and oriented to person, place, and time. Mental status is at baseline.  Psychiatric:        Mood and Affect: Mood normal.        Behavior: Behavior normal.     Assessment & Plan:   See Encounters Tab for problem based charting.  Patient discussed with Dr. Criselda Peaches

## 2018-10-21 NOTE — Assessment & Plan Note (Signed)
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  He does however continue to have impacted cerumen in both ears which is likely contributing to your loss.  We performed irrigation of the impacted ears with a significant amount of ear wax removed. He does have some small amount remaining in both ears. I will resend the Debrox drops to loosen any remaining wax. I will also send in a referral to audiology for formal evaluation of sensorineural loss.

## 2018-10-21 NOTE — Assessment & Plan Note (Addendum)
Assessment/plan: He is due for colon cancer screening and fit test was ordered at last clinic visit.  Unfortunately he was unable to complete this.  I have recommended that he pick up another test from the lab and have this done ASAP.  Addendum: FIT negative. Will repeat FIT in 1 year.

## 2018-10-21 NOTE — Assessment & Plan Note (Signed)
Assessment/plan: Blood pressure above goal (SBP >130) at 134/79 while on lisinopril-HCTZ 40-25 mg QD and amlodipine 5 mg QD.  He has had better blood pressure during previous visits in the past year.  I will hold off on adding any additional medication at this time.  If his blood pressure remains above goal at next clinic visit we will plan on adjusting medication regiment.

## 2018-10-21 NOTE — Assessment & Plan Note (Signed)
Assessment/plan: Stable.  He reports good compliance with Biktarvy.  He denies needing any refills at this time.

## 2018-10-22 MED FILL — BIKTARVY 50-200-25 MG TABS: 50-200-25 | 30 days supply | Qty: 30 | Fill #3

## 2018-10-22 NOTE — Progress Notes (Signed)
Internal Medicine Clinic Attending  Case discussed with Dr. Prince at the time of the visit.  We reviewed the resident's history and exam and pertinent patient test results.  I agree with the assessment, diagnosis, and plan of care documented in the resident's note.   

## 2018-10-23 NOTE — Addendum Note (Signed)
Addended by: Synetta Shadow on: 10/23/2018 12:45 PM   Modules accepted: Orders

## 2018-10-27 DIAGNOSIS — Z0000 Encounter for general adult medical examination without abnormal findings: Secondary | ICD-10-CM | POA: Diagnosis not present

## 2018-10-30 ENCOUNTER — Other Ambulatory Visit: Payer: Self-pay | Admitting: Internal Medicine

## 2018-10-30 DIAGNOSIS — I10 Essential (primary) hypertension: Secondary | ICD-10-CM

## 2018-11-04 ENCOUNTER — Other Ambulatory Visit: Payer: Medicare Other

## 2018-11-04 DIAGNOSIS — Z0000 Encounter for general adult medical examination without abnormal findings: Secondary | ICD-10-CM

## 2018-11-05 LAB — FECAL OCCULT BLOOD, IMMUNOCHEMICAL: FECAL OCCULT BLD: NEGATIVE

## 2018-11-11 ENCOUNTER — Telehealth: Payer: Self-pay | Admitting: Internal Medicine

## 2018-11-11 NOTE — Telephone Encounter (Signed)
FIT negative. Repeat FIT in 1 year. Attempted to call but did not get answer. I could not leave a voicemail.

## 2018-11-16 MED FILL — BIKTARVY 50-200-25 MG TABS: 50-200-25 | 30 days supply | Qty: 30 | Fill #4

## 2018-12-14 ENCOUNTER — Other Ambulatory Visit: Payer: Self-pay | Admitting: Internal Medicine

## 2018-12-14 MED FILL — BIKTARVY 50-200-25 MG TABS: 50-200-25 | 30 days supply | Qty: 30 | Fill #5

## 2019-01-02 ENCOUNTER — Other Ambulatory Visit: Payer: Self-pay | Admitting: Internal Medicine

## 2019-01-08 MED FILL — BIKTARVY 50-200-25 MG TABS: 50-200-25 | 30 days supply | Qty: 30 | Fill #0

## 2019-02-02 DIAGNOSIS — H9193 Unspecified hearing loss, bilateral: Secondary | ICD-10-CM | POA: Diagnosis not present

## 2019-02-02 DIAGNOSIS — H9311 Tinnitus, right ear: Secondary | ICD-10-CM | POA: Diagnosis not present

## 2019-02-02 DIAGNOSIS — H6123 Impacted cerumen, bilateral: Secondary | ICD-10-CM | POA: Diagnosis not present

## 2019-02-03 ENCOUNTER — Other Ambulatory Visit: Payer: Self-pay

## 2019-02-03 ENCOUNTER — Other Ambulatory Visit: Payer: Self-pay | Admitting: Internal Medicine

## 2019-02-03 DIAGNOSIS — B20 Human immunodeficiency virus [HIV] disease: Secondary | ICD-10-CM

## 2019-02-03 DIAGNOSIS — Z113 Encounter for screening for infections with a predominantly sexual mode of transmission: Secondary | ICD-10-CM

## 2019-02-03 DIAGNOSIS — Z79899 Other long term (current) drug therapy: Secondary | ICD-10-CM

## 2019-02-04 ENCOUNTER — Other Ambulatory Visit: Payer: Medicare Other

## 2019-02-04 ENCOUNTER — Other Ambulatory Visit: Payer: Self-pay

## 2019-02-04 DIAGNOSIS — B20 Human immunodeficiency virus [HIV] disease: Secondary | ICD-10-CM | POA: Diagnosis not present

## 2019-02-04 DIAGNOSIS — Z79899 Other long term (current) drug therapy: Secondary | ICD-10-CM | POA: Diagnosis not present

## 2019-02-04 DIAGNOSIS — Z113 Encounter for screening for infections with a predominantly sexual mode of transmission: Secondary | ICD-10-CM | POA: Diagnosis not present

## 2019-02-04 MED FILL — BIKTARVY 50-200-25 MG TABS: 50-200-25 | 30 days supply | Qty: 30 | Fill #0

## 2019-02-05 LAB — T-HELPER CELL (CD4) - (RCID CLINIC ONLY)
CD4 % Helper T Cell: 14 % — ABNORMAL LOW (ref 33–65)
CD4 T Cell Abs: 256 /uL — ABNORMAL LOW (ref 400–1790)

## 2019-02-16 DIAGNOSIS — H903 Sensorineural hearing loss, bilateral: Secondary | ICD-10-CM | POA: Diagnosis not present

## 2019-02-17 ENCOUNTER — Telehealth: Payer: Self-pay | Admitting: Internal Medicine

## 2019-02-17 NOTE — Telephone Encounter (Signed)
COVID-19 Pre-Screening Questions: ° °Do you currently have a fever (>100 °F), chills or unexplained body aches? No  ° °Are you currently experiencing new cough, shortness of breath, sore throat, runny nose? No  °•  °Have you recently travelled outside the state of [place] in the last 14 days? No  °•  °1. Have you been in contact with someone that is currently pending confirmation of Covid19 testing or has been confirmed to have the Covid19 virus?  No  ° °

## 2019-02-18 ENCOUNTER — Ambulatory Visit: Payer: Medicare Other | Admitting: Internal Medicine

## 2019-02-18 LAB — COMPREHENSIVE METABOLIC PANEL
AG Ratio: 1.3 (calc) (ref 1.0–2.5)
ALT: 13 U/L (ref 9–46)
AST: 18 U/L (ref 10–35)
Albumin: 3.9 g/dL (ref 3.6–5.1)
Alkaline phosphatase (APISO): 92 U/L (ref 35–144)
BUN/Creatinine Ratio: 12 (calc) (ref 6–22)
BUN: 17 mg/dL (ref 7–25)
CO2: 33 mmol/L — ABNORMAL HIGH (ref 20–32)
Calcium: 9.8 mg/dL (ref 8.6–10.3)
Chloride: 103 mmol/L (ref 98–110)
Creat: 1.47 mg/dL — ABNORMAL HIGH (ref 0.70–1.18)
Globulin: 3 g/dL (calc) (ref 1.9–3.7)
Glucose, Bld: 102 mg/dL — ABNORMAL HIGH (ref 65–99)
Potassium: 4 mmol/L (ref 3.5–5.3)
Sodium: 143 mmol/L (ref 135–146)
Total Bilirubin: 0.9 mg/dL (ref 0.2–1.2)
Total Protein: 6.9 g/dL (ref 6.1–8.1)

## 2019-02-18 LAB — LIPID PANEL
Cholesterol: 96 mg/dL (ref <200)
HDL: 42 mg/dL (ref >=40)
LDL Cholesterol (Calc): 42 mg/dL (calc)
Non-HDL Cholesterol (Calc): 54 mg/dL (calc) (ref <130)
Total CHOL/HDL Ratio: 2.3 (calc) (ref <5.0)
Triglycerides: 43 mg/dL (ref <150)

## 2019-02-18 LAB — CBC WITH DIFFERENTIAL/PLATELET
Absolute Monocytes: 288 cells/uL (ref 200–950)
Basophils Absolute: 9 cells/uL (ref 0–200)
Basophils Relative: 0.2 %
Eosinophils Absolute: 50 cells/uL (ref 15–500)
Eosinophils Relative: 1.1 %
HCT: 43.7 % (ref 38.5–50.0)
Hemoglobin: 14.9 g/dL (ref 13.2–17.1)
Lymphs Abs: 2120 cells/uL (ref 850–3900)
MCH: 29.3 pg (ref 27.0–33.0)
MCHC: 34.1 g/dL (ref 32.0–36.0)
MCV: 86 fL (ref 80.0–100.0)
MPV: 10.5 fL (ref 7.5–12.5)
Monocytes Relative: 6.4 %
Neutro Abs: 2034 cells/uL (ref 1500–7800)
Neutrophils Relative %: 45.2 %
Platelets: 134 10*3/uL — ABNORMAL LOW (ref 140–400)
RBC: 5.08 10*6/uL (ref 4.20–5.80)
RDW: 13 % (ref 11.0–15.0)
Total Lymphocyte: 47.1 %
WBC: 4.5 10*3/uL (ref 3.8–10.8)

## 2019-02-18 LAB — HIV-1 RNA QUANT-NO REFLEX-BLD
HIV 1 RNA Quant: <20 copies/mL
HIV-1 RNA Quant, Log: <1.3 Log copies/mL

## 2019-02-18 LAB — RPR: RPR Ser Ql: NONREACTIVE

## 2019-02-21 ENCOUNTER — Encounter: Payer: Self-pay | Admitting: *Deleted

## 2019-03-02 ENCOUNTER — Encounter: Payer: Self-pay | Admitting: Internal Medicine

## 2019-03-02 ENCOUNTER — Other Ambulatory Visit: Payer: Self-pay

## 2019-03-02 ENCOUNTER — Ambulatory Visit (INDEPENDENT_AMBULATORY_CARE_PROVIDER_SITE_OTHER): Payer: Medicare Other | Admitting: Internal Medicine

## 2019-03-02 VITALS — BP 143/87 | HR 71 | Temp 98.4°F | Wt 159.0 lb

## 2019-03-02 DIAGNOSIS — Z23 Encounter for immunization: Secondary | ICD-10-CM | POA: Diagnosis not present

## 2019-03-02 DIAGNOSIS — Z113 Encounter for screening for infections with a predominantly sexual mode of transmission: Secondary | ICD-10-CM

## 2019-03-02 DIAGNOSIS — N289 Disorder of kidney and ureter, unspecified: Secondary | ICD-10-CM | POA: Diagnosis not present

## 2019-03-02 DIAGNOSIS — B20 Human immunodeficiency virus [HIV] disease: Secondary | ICD-10-CM

## 2019-03-02 DIAGNOSIS — E7849 Other hyperlipidemia: Secondary | ICD-10-CM

## 2019-03-02 NOTE — Assessment & Plan Note (Signed)
Doing well and labs reassuring.  rtc 6 months.

## 2019-03-02 NOTE — Assessment & Plan Note (Signed)
Screened negative 

## 2019-03-02 NOTE — Assessment & Plan Note (Signed)
Stable creat 

## 2019-03-02 NOTE — Addendum Note (Signed)
Addended by: Aundria Rud on: 03/02/2019 03:47 PM   Modules accepted: Orders

## 2019-03-02 NOTE — Assessment & Plan Note (Signed)
Stable LDL.  I will defer any changes/managment to his PCP

## 2019-03-02 NOTE — Progress Notes (Signed)
   Subjective:    Patient ID: Thomas [name], male    DOB: 11/20/1945, 73 y.o.   MRN: [id number]  HPI Here for follow up of HIV.  Remains on Biktarvy after changing last year.  No missed doses and no associated n/v/d.  No new issues.  CD4 256, viral load < 20.  Creat stable, remaining mildly elevated.     Review of Systems  Gastrointestinal: Negative for abdominal distention, blood in stool, constipation, diarrhea and nausea.  Skin: Negative for rash.       Objective:   Physical Exam  Constitutional: He appears well-developed and well-nourished. No distress.  HENT:  Mouth/Throat: No oropharyngeal exudate.  Eyes: No scleral icterus.  Cardiovascular: Normal rate, regular rhythm and normal heart sounds.  Pulmonary/Chest: Effort normal and breath sounds normal. No respiratory distress.  Lymphadenopathy:    He has no cervical adenopathy.  Skin: No rash noted.   SH: not sexually active        Assessment & Plan:

## 2019-03-05 ENCOUNTER — Other Ambulatory Visit: Payer: Self-pay | Admitting: Internal Medicine

## 2019-03-08 ENCOUNTER — Other Ambulatory Visit: Payer: Self-pay | Admitting: Internal Medicine

## 2019-03-08 DIAGNOSIS — B20 Human immunodeficiency virus [HIV] disease: Secondary | ICD-10-CM

## 2019-03-08 MED FILL — BIKTARVY 50-200-25 MG TABS: 50-200-25 | 30 days supply | Qty: 30 | Fill #0

## 2019-03-16 ENCOUNTER — Other Ambulatory Visit: Payer: Self-pay | Admitting: *Deleted

## 2019-03-16 MED ORDER — LISINOPRIL-HYDROCHLOROTHIAZIDE 20-12.5 MG PO TABS: 2.0000 | ORAL_TABLET | Freq: Every day | ORAL | 0 refills | Status: DC

## 2019-04-06 MED FILL — BIKTARVY 50-200-25 MG TABS: 50-200-25 | 30 days supply | Qty: 30 | Fill #1

## 2019-04-20 ENCOUNTER — Ambulatory Visit (INDEPENDENT_AMBULATORY_CARE_PROVIDER_SITE_OTHER): Payer: Medicare Other | Admitting: Internal Medicine

## 2019-04-20 ENCOUNTER — Encounter: Payer: Self-pay | Admitting: Internal Medicine

## 2019-04-20 ENCOUNTER — Other Ambulatory Visit: Payer: Self-pay

## 2019-04-20 VITALS — Ht 69.0 in

## 2019-04-20 DIAGNOSIS — Z0000 Encounter for general adult medical examination without abnormal findings: Secondary | ICD-10-CM | POA: Diagnosis not present

## 2019-04-20 NOTE — Progress Notes (Signed)
This AWV is being conducted by TELEHEALTH - AUDIO only. The patient was located at home and [facility] was [facility]. The patient's identity was confirmed using their DOB and current address. The patient or his/her legal guardian has consented to being evaluated through a telephone encounter and understands the associated risks (an examination cannot be done and the patient may need to come in for an appointment) / benefits (allows the patient to remain at home, decreasing exposure to coronavirus). I personally spent 25 minutes conducting the AWV.  Subjective:   Thomas [name] is a 73 y.o. male who presents for a Medicare Annual Wellness Visit.  The following items have been reviewed and updated today in the appropriate area in the EMR.   Health Risk Assessment  Height, weight, BMI, and BP Visual acuity if needed Depression screen Fall risk / safety level Advance directive discussion Medical and family history were reviewed and updated Updating list of other providers & suppliers Medication reconciliation, including over the counter medicines Cognitive screen Written screening schedule Risk Factor list Personalized health advice, risky behaviors, and treatment advice  Social History   Social History Narrative   Current Social History 04/20/2019        Patient lives alone in a home which is 1 story. There are 2 steps without handrails up to the entrance the patient uses.       Patient's method of transportation is personal car.      The highest level of education was high school diploma.      The patient currently retired.      Identified important Relationships are "My sister, Thurston Holenne."       Pets : "My cat died a year ago, but I'm looking at another one to adopt."       Interests / Fun: "Walking, and shooting rifle at the rifle range."       Current Stressors: "I'm looking to buy a house."       Religious / Personal Beliefs: "[employer]"       L. Bani Gianfrancesco, RN, BSN              Objective:    Vitals: Ht 5\' 9"  (1.753 m)   BMI 23.48 kg/m  Vitals are unable to obtained due to COVID-19 public health emergency  Activities of Daily Living In your present state of health, do you have any difficulty performing the following activities: 04/20/2019 10/21/2018  Hearing? Y N  Comment 2/2 work in [employer]  [employer]? N N  Difficulty concentrating or making decisions? N N  Walking or climbing stairs? N N  Dressing or bathing? N N  Doing errands, shopping? N N  Some recent data might be hidden    Goals Goals    . Exercise     Patient will continue walking 20 minutes 2-3 days per week. He will begin seated and standing exercises on days he doesn't walk.       Fall Risk Fall Risk  04/20/2019 03/02/2019 10/21/2018 05/29/2018 01/27/2018  Falls in the past year? 0 0 0 No No  Number falls in past yr: - 0 - - -  Injury with Fall? - 0 - - -  Follow up Education provided;Falls prevention discussed - - - -  CDC Handout on Fall Prevention and Handout on Home Exercise Program, Access codes [email or web address] and [id number] given/mailed to patient with red exercise band.    Depression Screen PHQ 2/9 Scores 04/20/2019 03/02/2019 05/29/2018 01/27/2018  PHQ -  2 Score 0 0 0 0  PHQ- 9 Score 0 - - -     Cognitive Testing Six-Item Cognitive Screener   "I would like to ask you some questions that ask you to use your memory. I am going to name three objects. Please wait until I say all three words, then repeat them. Remember what they are  because I am going to ask you to name them again in a few minutes. Please repeat these words for me: APPLE-TABLE-PENNY." (Interviewer may repeat names 3 times if necessary but repetition not scored.)  Did patient correctly repeat all three words? Yes - may proceed with screen  What year is this? Correct What month is this? Correct What day of the week is this? Correct  What were the three objects I asked you to remember? . Apple Correct . Table  Correct . Penny Correct  Score one point for each incorrect answer.  A score of 2 or more points warrants additional investigation.  Patient's score 0    Assessment and Plan:    During the course of the visit the patient was educated and counseled about appropriate screening and preventive services as documented in the assessment and plan.  The printed AVS was given to the patient and included an updated screening schedule, a list of risk factors, and personalized health advice.        Velora Heckler, RN  04/20/2019

## 2019-04-20 NOTE — Patient Instructions (Addendum)
Annual Wellness Visit   Medicare Covered Preventative Screenings and Services  Services & Screenings Men and Women Who How Often Need? Date of Last Service Action  Abdominal Aortic Aneurysm Adults with AAA risk factors Once     Alcohol Misuse and Counseling All Adults Screening once a year if no alcohol misuse. Counseling up to 4 face to face sessions.     Bone Density Measurement  Adults at risk for osteoporosis Once every 2 yrs     Lipid Panel Z13.6 All adults without CV disease Once every 5 yrs     Colorectal Cancer   Stool sample or  Colonoscopy All adults 57 and older   Once every year  Every 10 years     Depression All Adults Once a year  Today   Diabetes Screening Blood glucose, post glucose load, or GTT Z13.1  All adults at risk  Pre-diabetics  Once per year  Twice per year     Diabetes  Self-Management Training All adults Diabetics 10 hrs first year; 2 hours subsequent years. Requires Copay     Glaucoma  Diabetics  Family history of glaucoma  African Americans 20 yrs +  Hispanic Americans 67 yrs + Annually - requires coppay     Hepatitis C Z72.89 or F19.20  High Risk for HCV  Born between 1945 and 1965  Annually  Once     HIV Z11.4 All adults based on risk  Annually btw ages 4 & 62 regardless of risk  Annually > 65 yrs if at increased risk     Lung Cancer Screening Asymptomatic adults aged 34-77 with 30 pack yr history and current smoker OR quit within the last 15 yrs Annually Must have counseling and shared decision making documentation before first screen     Medical Nutrition Therapy Adults with   Diabetes  Renal disease  Kidney transplant within past 3 yrs 3 hours first year; 2 hours subsequent years     Obesity and Counseling All adults Screening once a year Counseling if BMI 30 or higher  Today   Tobacco Use Counseling Adults who use tobacco  Up to 8 visits in one year     Vaccines Z23  Hepatitis B  Influenza   Pneumonia  Adults    Once  Once every flu season  Two different vaccines separated by one year     Get flu shot starting September 1  Next Annual Wellness Visit People with Medicare Every year  Today     Services & Screenings Women Who How Often Need  Date of Last Service Action  Mammogram  Z12.31 Women over 71 One baseline ages 40-39. Annually ager 40 yrs+     Pap tests All women Annually if high risk. Every 2 yrs for normal risk women     Screening for cervical cancer with   Pap (Z01.419 nl or Z01.411abnl) &  HPV Z11.51 Women aged 48 to 8 Once every 5 yrs     Screening pelvic and breast exams All women Annually if high risk. Every 2 yrs for normal risk women     Sexually Transmitted Diseases  Chlamydia  Gonorrhea  Syphilis All at risk adults Annually for non pregnant females at increased risk         [place] Men Who How Ofter Need  Date of Last Service Action  Prostate Cancer - DRE & PSA Men over 50 Annually.  DRE might require a copay.     Sexually Transmitted Diseases  Syphilis All at risk adults Annually for men at increased risk         Things That May Be Affecting Your Health:  Alcohol X Hearing loss  Pain    Depression  Home Safety  Sexual Health   Diabetes  Lack of physical activity  Stress   Difficulty with daily activities  Loneliness  Tiredness   Drug use  Medicines  Tobacco use   Falls  Motor Vehicle Safety  Weight   Food choices  Oral Health  Other    YOUR PERSONALIZED HEALTH PLAN : 1. Schedule your next subsequent Medicare Wellness visit in one year 2. Attend all of your regular appointments to address your medical issues 3. Complete the preventative screenings and services 4. Begin seated and standing exercises with exercise band on days you don't walk. 5. Get flu shot starting September 1.    Fall Prevention in the Home, Adult Falls can cause injuries. They can happen to people of all ages. There are many things you can do to make your home  safe and to help prevent falls. Ask for help when making these changes, if needed. What actions can I take to prevent falls? General Instructions  Use good lighting in all rooms. Replace any light bulbs that burn out.  Turn on the lights when you go into a dark area. Use night-lights.  Keep items that you use often in easy-to-reach places. Lower the shelves around your home if necessary.  Set up your furniture so you have a clear path. Avoid moving your furniture around.  Do not have throw rugs and other things on the floor that can make you trip.  Avoid walking on wet floors.  If any of your floors are uneven, fix them.  Add color or contrast paint or tape to clearly mark and help you see: ? Any grab bars or handrails. ? First and last steps of stairways. ? Where the edge of each step is.  If you use a stepladder: ? Make sure that it is fully opened. Do not climb a closed stepladder. ? Make sure that both sides of the stepladder are locked into place. ? Ask someone to hold the stepladder for you while you use it.  If there are any pets around you, be aware of where they are. What can I do in the bathroom?      Keep the floor dry. Clean up any water that spills onto the floor as soon as it happens.  Remove soap buildup in the tub or shower regularly.  Use non-skid mats or decals on the floor of the tub or shower.  Attach bath mats securely with double-sided, non-slip rug tape.  If you need to sit down in the shower, use a plastic, non-slip stool.  Install grab bars by the toilet and in the tub and shower. Do not use towel bars as grab bars. What can I do in the bedroom?  Make sure that you have a light by your bed that is easy to reach.  Do not use any sheets or blankets that are too big for your bed. They should not hang down onto the floor.  Have a firm chair that has side arms. You can use this for support while you get dressed. What can I do in the kitchen?   Clean up any spills right away.  If you need to reach something above you, use a strong step stool that has a grab bar.  Keep electrical  cords out of the way.  Do not use floor polish or wax that makes floors slippery. If you must use wax, use non-skid floor wax. What can I do with my stairs?  Do not leave any items on the stairs.  Make sure that you have a light switch at the top of the stairs and the bottom of the stairs. If you do not have them, ask someone to add them for you.  Make sure that there are handrails on both sides of the stairs, and use them. Fix handrails that are broken or loose. Make sure that handrails are as long as the stairways.  Install non-slip stair treads on all stairs in your home.  Avoid having throw rugs at the top or bottom of the stairs. If you do have throw rugs, attach them to the floor with carpet tape.  Choose a carpet that does not hide the edge of the steps on the stairway.  Check any carpeting to make sure that it is firmly attached to the stairs. Fix any carpet that is loose or worn. What can I do on the outside of my home?  Use bright outdoor lighting.  Regularly fix the edges of walkways and driveways and fix any cracks.  Remove anything that might make you trip as you walk through a door, such as a raised step or threshold.  Trim any bushes or trees on the path to your home.  Regularly check to see if handrails are loose or broken. Make sure that both sides of any steps have handrails.  Install guardrails along the edges of any raised decks and porches.  Clear walking paths of anything that might make someone trip, such as tools or rocks.  Have any leaves, snow, or ice cleared regularly.  Use sand or salt on walking paths during winter.  Clean up any spills in your garage right away. This includes grease or oil spills. What other actions can I take?  Wear shoes that: ? Have a low heel. Do not wear high heels. ? Have rubber  bottoms. ? Are comfortable and fit you well. ? Are closed at the toe. Do not wear open-toe sandals.  Use tools that help you move around (mobility aids) if they are needed. These include: ? Canes. ? Walkers. ? Scooters. ? Crutches.  Review your medicines with your doctor. Some medicines can make you feel dizzy. This can increase your chance of falling. Ask your doctor what other things you can do to help prevent falls. Where to find more information  Centers for Disease Control and Prevention, STEADI: HealthcareCounselor.com.pthttps://cdc.gov  [employer] Aging: RingConnections.sihttps://go4life.nia.nih.gov Contact a doctor if:  You are afraid of falling at home.  You feel weak, drowsy, or dizzy at home.  You fall at home. Summary  There are many simple things that you can do to make your home safe and to help prevent falls.  Ways to make your home safe include removing tripping hazards and installing grab bars in the bathroom.  Ask for help when making these changes in your home. This information is not intended to replace advice given to you by your health care provider. Make sure you discuss any questions you have with your health care provider. Document Released: 06/15/2009 Document Revised: 12/10/2018 Document Reviewed: 04/03/2017 Elsevier Patient Education  2020 [employer]   Health Maintenance, Male Adopting a healthy lifestyle and getting preventive care are important in promoting health and wellness. Ask your health care provider about:  The right schedule for you to have regular tests and exams.  Things you can do on your own to prevent diseases and keep yourself healthy. What should I know about diet, weight, and exercise? Eat a healthy diet   Eat a diet that includes plenty of vegetables, fruits, low-fat dairy products, and lean protein.  Do not eat a lot of foods that are high in solid fats, added sugars, or sodium. Maintain a healthy weight Body mass index (BMI) is a measurement that  can be used to identify possible weight problems. It estimates body fat based on height and weight. Your health care provider can help determine your BMI and help you achieve or maintain a healthy weight. Get regular exercise Get regular exercise. This is one of the most important things you can do for your health. Most adults should:  Exercise for at least 150 minutes each week. The exercise should increase your heart rate and make you sweat (moderate-intensity exercise).  Do strengthening exercises at least twice a week. This is in addition to the moderate-intensity exercise.  Spend less time sitting. Even light physical activity can be beneficial. Watch cholesterol and blood lipids Have your blood tested for lipids and cholesterol at 74 years of age, then have this test every 5 years. You may need to have your cholesterol levels checked more often if:  Your lipid or cholesterol levels are high.  You are older than 74 years of age.  You are at high risk for heart disease. What should I know about cancer screening? Many types of cancers can be detected early and may often be prevented. Depending on your health history and family history, you may need to have cancer screening at various ages. This may include screening for:  Colorectal cancer.  Prostate cancer.  Skin cancer.  Lung cancer. What should I know about heart disease, diabetes, and high blood pressure? Blood pressure and heart disease  High blood pressure causes heart disease and increases the risk of stroke. This is more likely to develop in people who have high blood pressure readings, are of African descent, or are overweight.  Talk with your health care provider about your target blood pressure readings.  Have your blood pressure checked: ? Every 3-5 years if you are 77-89 years of age. ? Every year if you are 41 years old or older.  If you are between the ages of 27 and 19 and are a current or former smoker, ask  your health care provider if you should have a one-time screening for abdominal aortic aneurysm (AAA). Diabetes Have regular diabetes screenings. This checks your fasting blood sugar level. Have the screening done:  Once every three years after age 39 if you are at a normal weight and have a low risk for diabetes.  More often and at a younger age if you are overweight or have a high risk for diabetes. What should I know about preventing infection? Hepatitis B If you have a higher risk for hepatitis B, you should be screened for this virus. Talk with your health care provider to find out if you are at risk for hepatitis B infection. Hepatitis C Blood testing is recommended for:  Everyone born from [birth year] through 1965.  Anyone with known risk factors for hepatitis C. Sexually transmitted infections (STIs)  You should be screened each year for STIs, including gonorrhea and chlamydia, if: ? You are sexually active and are younger than 74 years of age. ? You are older  than 74 years of age and your health care provider tells you that you are at risk for this type of infection. ? Your sexual activity has changed since you were last screened, and you are at increased risk for chlamydia or gonorrhea. Ask your health care provider if you are at risk.  Ask your health care provider about whether you are at high risk for HIV. Your health care provider may recommend a prescription medicine to help prevent HIV infection. If you choose to take medicine to prevent HIV, you should first get tested for HIV. You should then be tested every 3 months for as long as you are taking the medicine. Follow these instructions at home: Lifestyle  Do not use any products that contain nicotine or tobacco, such as cigarettes, e-cigarettes, and chewing tobacco. If you need help quitting, ask your health care provider.  Do not use street drugs.  Do not share needles.  Ask your health care provider for help if you need  support or information about quitting drugs. Alcohol use  Do not drink alcohol if your health care provider tells you not to drink.  If you drink alcohol: ? Limit how much you have to 0-2 drinks a day. ? Be aware of how much alcohol is in your drink. In the [place] one drink equals one 12 oz bottle of beer (355 mL), one 5 oz glass of wine (148 mL), or one 1 oz glass of hard liquor (44 mL). General instructions  Schedule regular health, dental, and eye exams.  Stay current with your vaccines.  Tell your health care provider if: ? You often feel depressed. ? You have ever been abused or do not feel safe at home. Summary  Adopting a healthy lifestyle and getting preventive care are important in promoting health and wellness.  Follow your health care provider's instructions about healthy diet, exercising, and getting tested or screened for diseases.  Follow your health care provider's instructions on monitoring your cholesterol and blood pressure. This information is not intended to replace advice given to you by your health care provider. Make sure you discuss any questions you have with your health care provider. Document Released: 02/15/2008 Document Revised: 08/12/2018 Document Reviewed: 08/12/2018 Elsevier Patient Education  2020 [employer]

## 2019-04-20 NOTE — Progress Notes (Signed)
Internal Medicine Clinic Attending  Case discussed with Dr. Bloomfield at the time of the visit.  We reviewed the AWV findings.  I agree with the assessment, diagnosis, and plan of care documented in the AWV note.     

## 2019-04-20 NOTE — Progress Notes (Signed)
I discussed the AWV findings with the RN who conducted the visit. I was present in the office suite and immediately available to provide assistance and direction throughout the time the service was provided.   

## 2019-04-26 ENCOUNTER — Other Ambulatory Visit: Payer: Self-pay | Admitting: Internal Medicine

## 2019-04-26 DIAGNOSIS — I10 Essential (primary) hypertension: Secondary | ICD-10-CM

## 2019-04-28 ENCOUNTER — Other Ambulatory Visit: Payer: Self-pay | Admitting: Internal Medicine

## 2019-04-28 DIAGNOSIS — I10 Essential (primary) hypertension: Secondary | ICD-10-CM

## 2019-04-28 MED ORDER — AMLODIPINE BESYLATE 5 MG PO TABS: 5.0000 mg | ORAL_TABLET | Freq: Every day | ORAL | 1 refills | Status: DC

## 2019-05-05 MED FILL — BIKTARVY 50-200-25 MG TABS: 50-200-25 | 30 days supply | Qty: 30 | Fill #2

## 2019-06-02 MED FILL — BIKTARVY 50-200-25 MG TABS: 50-200-25 | 30 days supply | Qty: 30 | Fill #3

## 2019-06-07 ENCOUNTER — Other Ambulatory Visit: Payer: Self-pay | Admitting: Internal Medicine

## 2019-06-15 ENCOUNTER — Ambulatory Visit: Payer: Medicare Other

## 2019-06-15 ENCOUNTER — Other Ambulatory Visit: Payer: Self-pay | Admitting: *Deleted

## 2019-06-15 DIAGNOSIS — E7849 Other hyperlipidemia: Secondary | ICD-10-CM

## 2019-06-15 MED ORDER — ATORVASTATIN CALCIUM 40 MG PO TABS: ORAL_TABLET | ORAL | 3 refills | Status: DC

## 2019-06-15 NOTE — Telephone Encounter (Signed)
Next appt scheduled 10/20 in [facility].

## 2019-06-22 ENCOUNTER — Other Ambulatory Visit: Payer: Self-pay

## 2019-06-22 ENCOUNTER — Encounter: Payer: Self-pay | Admitting: Internal Medicine

## 2019-06-22 ENCOUNTER — Ambulatory Visit (INDEPENDENT_AMBULATORY_CARE_PROVIDER_SITE_OTHER): Payer: Medicare Other | Admitting: Internal Medicine

## 2019-06-22 VITALS — BP 113/69 | HR 83 | Temp 98.6°F | Ht 69.0 in

## 2019-06-22 DIAGNOSIS — Z0000 Encounter for general adult medical examination without abnormal findings: Secondary | ICD-10-CM

## 2019-06-22 DIAGNOSIS — Z79899 Other long term (current) drug therapy: Secondary | ICD-10-CM

## 2019-06-22 DIAGNOSIS — Z7982 Long term (current) use of aspirin: Secondary | ICD-10-CM | POA: Diagnosis not present

## 2019-06-22 DIAGNOSIS — I10 Essential (primary) hypertension: Secondary | ICD-10-CM

## 2019-06-22 NOTE — Assessment & Plan Note (Addendum)
Patient presenting for blood pressure check after recent telephone AWV. His Amlodipine was refilled after that visit. BP today 113/69. Recent lab work done by ID is stable. We will continue current regimen and have him follow up with PCP in about 6 months or sooner if needed by patient. - Amlodipine 5mg  Daily - Lisinopril-HCTZ 40-25mg  Daily

## 2019-06-22 NOTE — Assessment & Plan Note (Signed)
-   Flu shot refused

## 2019-06-22 NOTE — Patient Instructions (Addendum)
Thank you for allowing [place] to care for you  For your high blood pressure - BP look good today - Continue Amlodipine - Continue Lisinopril-HCTZ combination pill   Follow up with PCP in about 6 months

## 2019-06-22 NOTE — Progress Notes (Signed)
   CC: Hypertension, Preventative Health Care  HPI:  Thomas [name] is a 73 y.o. F with PMHx listed below presenting for Hypertension, Preventative Health Care. Please see the A&P for the status of the patient's chronic medical problems.  Past Medical History:  Diagnosis Date  . 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  Recommend 1 year follow up with CT or MRI.   . Enlarged thyroid 04/22/2015   Seen on CTA 04/21/15: Contains multiple nodules measuring up to 2.1 cm in size, TSH wnl.  Needs thyroid ultrasound and biopsy  . Heart murmur   . HIV (human immunodeficiency virus infection) ([place])   . HTN (hypertension)   . Immune deficiency disorder ([place])   . Stroke [facility])    Review of Systems:  Performed and all others negative.  Physical Exam:  Vitals:   06/22/19 1507  BP: 113/69  Pulse: 83  Temp: 98.6 F (37 C)  TempSrc: Oral  SpO2: 98%  Height: 5\' 9"  (1.753 m)   Physical Exam Constitutional:      General: He is not in acute distress.    Appearance: Normal appearance.  Cardiovascular:     Rate and Rhythm: Normal rate and regular rhythm.     Pulses: Normal pulses.     Heart sounds: Normal heart sounds.  Pulmonary:     Effort: Pulmonary effort is normal. No respiratory distress.     Breath sounds: Normal breath sounds.  Abdominal:     General: Bowel sounds are normal. There is no distension.     Palpations: Abdomen is soft.     Tenderness: There is no abdominal tenderness.  Musculoskeletal:        General: No swelling or deformity.  Skin:    General: Skin is warm and dry.  Neurological:     General: No focal deficit present.     Mental Status: Mental status is at baseline.     Assessment & Plan:   See Encounters Tab for problem based charting.  Patient discussed with Dr. Heber [place]

## 2019-06-23 NOTE — Progress Notes (Signed)
Internal Medicine Clinic Attending  Case discussed with Dr. Melvin  at the time of the visit.  We reviewed the resident's history and exam and pertinent patient test results.  I agree with the assessment, diagnosis, and plan of care documented in the resident's note.  

## 2019-07-02 MED FILL — BIKTARVY 50-200-25 MG TABS: 50-200-25 | 30 days supply | Qty: 30 | Fill #4

## 2019-07-12 ENCOUNTER — Other Ambulatory Visit: Payer: Self-pay | Admitting: Internal Medicine

## 2019-07-12 DIAGNOSIS — I10 Essential (primary) hypertension: Secondary | ICD-10-CM

## 2019-08-04 MED FILL — BIKTARVY 50-200-25 MG TABS: 50-200-25 | 30 days supply | Qty: 30 | Fill #5

## 2019-08-30 MED FILL — BIKTARVY 50-200-25 MG TABS: 50-200-25 | 30 days supply | Qty: 30 | Fill #0

## 2019-09-01 ENCOUNTER — Other Ambulatory Visit: Payer: Self-pay

## 2019-09-01 ENCOUNTER — Other Ambulatory Visit: Payer: Medicare Other

## 2019-09-01 DIAGNOSIS — B20 Human immunodeficiency virus [HIV] disease: Secondary | ICD-10-CM

## 2019-09-02 LAB — T-HELPER CELL (CD4) - (RCID CLINIC ONLY)
CD4 % Helper T Cell: 15 % — ABNORMAL LOW (ref 33–65)
CD4 T Cell Abs: 274 /uL — ABNORMAL LOW (ref 400–1790)

## 2019-09-05 LAB — HIV-1 RNA QUANT-NO REFLEX-BLD
HIV 1 RNA Quant: <20 copies/mL
HIV-1 RNA Quant, Log: <1.3 Log copies/mL

## 2019-09-09 ENCOUNTER — Other Ambulatory Visit: Payer: Self-pay | Admitting: *Deleted

## 2019-09-09 MED ORDER — LISINOPRIL-HYDROCHLOROTHIAZIDE 20-12.5 MG PO TABS: 2.0000 | ORAL_TABLET | Freq: Every day | ORAL | 0 refills | Status: DC

## 2019-09-12 ENCOUNTER — Other Ambulatory Visit: Payer: Self-pay | Admitting: Internal Medicine

## 2019-09-15 ENCOUNTER — Encounter: Payer: Self-pay | Admitting: Internal Medicine

## 2019-09-15 ENCOUNTER — Other Ambulatory Visit: Payer: Self-pay

## 2019-09-15 ENCOUNTER — Ambulatory Visit (INDEPENDENT_AMBULATORY_CARE_PROVIDER_SITE_OTHER): Payer: Medicare Other | Admitting: Internal Medicine

## 2019-09-15 VITALS — BP 121/75 | HR 80 | Temp 98.0°F | Ht 69.0 in | Wt 160.0 lb

## 2019-09-15 DIAGNOSIS — B20 Human immunodeficiency virus [HIV] disease: Secondary | ICD-10-CM | POA: Diagnosis not present

## 2019-09-15 DIAGNOSIS — Z23 Encounter for immunization: Secondary | ICD-10-CM | POA: Diagnosis not present

## 2019-09-15 DIAGNOSIS — E7849 Other hyperlipidemia: Secondary | ICD-10-CM | POA: Diagnosis not present

## 2019-09-15 DIAGNOSIS — Z113 Encounter for screening for infections with a predominantly sexual mode of transmission: Secondary | ICD-10-CM

## 2019-09-15 NOTE — Assessment & Plan Note (Signed)
Discussed the flu shot and he refuses.

## 2019-09-15 NOTE — Assessment & Plan Note (Signed)
Doing well, no changes.  rtc 6 months.  

## 2019-09-15 NOTE — Progress Notes (Signed)
   Subjective:    Patient ID: Thomas [name], male    DOB: 14-Jan-1946, 74 y.o.   MRN: [id number]  HPI Here for follow up of HIV Has been on Biktarvy since last year.   No complaints.  CD4 of 274 and viral load < 20.     Review of Systems  Constitutional: Negative for unexpected weight change.  Gastrointestinal: Negative for diarrhea and nausea.  Skin: Negative for rash.       Objective:   Physical Exam  Constitutional: He appears well-developed and well-nourished. No distress.  Eyes: No scleral icterus.  Cardiovascular: Normal rate, regular rhythm and normal heart sounds.  Pulmonary/Chest: Effort normal and breath sounds normal. No respiratory distress.  Skin: No rash noted.          Assessment & Plan:

## 2019-09-28 ENCOUNTER — Telehealth: Payer: Self-pay | Admitting: Pharmacy Technician

## 2019-09-28 MED FILL — BIKTARVY 50-200-25 MG TABS: 50-200-25 | 30 days supply | Qty: 30 | Fill #1

## 2019-09-28 NOTE — Telephone Encounter (Signed)
[facility] Patient Advocate Encounter   I was successful in securing patient a $7500 grant from [employer] ([employer]) to provide copayment coverage for Biktarvy.  This will make the out of pocket cost $0.     I have spoken with the patient.    The billing information is as follows and has been shared with [facility].   Award Period: - 09/27/2020 Cardholder: [id number] BIN: [id number] PCN: [id number] Group: [id number]  Patient knows to call the office with questions or concerns.  Netty Starring. Dimas [facility] Phone: [phone] Fax:  [phone]

## 2019-10-25 MED FILL — BIKTARVY 50-200-25 MG TABS: 50-200-25 | 30 days supply | Qty: 30 | Fill #2

## 2019-11-18 MED FILL — BIKTARVY 50-200-25 MG TABS: 50-200-25 | 30 days supply | Qty: 30 | Fill #3

## 2019-12-12 ENCOUNTER — Other Ambulatory Visit: Payer: Self-pay | Admitting: Internal Medicine

## 2019-12-12 DIAGNOSIS — I10 Essential (primary) hypertension: Secondary | ICD-10-CM

## 2019-12-14 NOTE — Telephone Encounter (Signed)
Pt will need f/u appt. Please arrange this with pt.

## 2019-12-15 NOTE — Telephone Encounter (Signed)
Appt sch for 02/17/2020 @ 3:45 pm.

## 2019-12-16 MED FILL — BIKTARVY 50-200-25 MG TABS: 50-200-25 | 30 days supply | Qty: 30 | Fill #4

## 2019-12-27 DIAGNOSIS — R42 Dizziness and giddiness: Secondary | ICD-10-CM | POA: Diagnosis not present

## 2019-12-27 DIAGNOSIS — I10 Essential (primary) hypertension: Secondary | ICD-10-CM | POA: Diagnosis not present

## 2019-12-27 DIAGNOSIS — G44309 Post-traumatic headache, unspecified, not intractable: Secondary | ICD-10-CM | POA: Diagnosis not present

## 2019-12-27 DIAGNOSIS — S0993XA Unspecified injury of face, initial encounter: Secondary | ICD-10-CM | POA: Diagnosis not present

## 2019-12-27 DIAGNOSIS — M542 Cervicalgia: Secondary | ICD-10-CM | POA: Diagnosis not present

## 2019-12-27 DIAGNOSIS — S0990XA Unspecified injury of head, initial encounter: Secondary | ICD-10-CM | POA: Diagnosis not present

## 2020-01-18 MED FILL — BIKTARVY 50-200-25 MG TABS: 50-200-25 | 30 days supply | Qty: 30 | Fill #5

## 2020-02-11 ENCOUNTER — Encounter: Payer: Self-pay | Admitting: Internal Medicine

## 2020-02-11 ENCOUNTER — Other Ambulatory Visit: Payer: Self-pay | Admitting: Internal Medicine

## 2020-02-11 DIAGNOSIS — B20 Human immunodeficiency virus [HIV] disease: Secondary | ICD-10-CM

## 2020-02-11 NOTE — Progress Notes (Signed)
Pt initially scheduled for in-person appt today however is currently having car troubles. Appt was changed to tele-visit by schedulers. I call the patient and he is noting that appt was for blood pressure follow up. Unfortunately, this is not practical to do over the phone without being able to obtain a blood pressure reading and/or labs. Discussed this with the patient and he was agreeable for rescheduling an in person appt for next week.  No charge for this encounter.  Elige Radon, MD  02/20/20 10:16 AM

## 2020-02-14 MED FILL — BIKTARVY 50-200-25 MG TABS: 50-200-25 | 30 days supply | Qty: 30 | Fill #0

## 2020-02-17 ENCOUNTER — Ambulatory Visit: Payer: Medicare Other | Admitting: Internal Medicine

## 2020-02-17 ENCOUNTER — Other Ambulatory Visit: Payer: Self-pay

## 2020-02-17 DIAGNOSIS — I10 Essential (primary) hypertension: Secondary | ICD-10-CM

## 2020-02-24 ENCOUNTER — Ambulatory Visit (INDEPENDENT_AMBULATORY_CARE_PROVIDER_SITE_OTHER): Payer: Medicare Other | Admitting: Internal Medicine

## 2020-02-24 ENCOUNTER — Encounter: Payer: Self-pay | Admitting: Internal Medicine

## 2020-02-24 ENCOUNTER — Other Ambulatory Visit: Payer: Self-pay

## 2020-02-24 VITALS — BP 115/74 | HR 58 | Temp 97.9°F | Ht 69.0 in | Wt 153.7 lb

## 2020-02-24 DIAGNOSIS — B20 Human immunodeficiency virus [HIV] disease: Secondary | ICD-10-CM

## 2020-02-24 DIAGNOSIS — I10 Essential (primary) hypertension: Secondary | ICD-10-CM

## 2020-02-24 NOTE — Progress Notes (Signed)
   CC: HTN follow up  HPI:  [name] is a 74 y.o. male with PMHx as documented below, presented for HTN follow up. Please refer to problem based charting for further details and assessment and plan of current problem and chronic medical conditions.  PMHx: HTN, HLD, HIV, thyromegaly  Medications: Amlodipine 5 mg daily, lisinopril-HCTZ 20-12.5 mg daily, aspirin 325 mg daily, Lipitor 40 mg daily, Biktarvy  Past Medical History:  Diagnosis Date  . 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  Recommend 1 year follow up with CT or MRI.   . Enlarged thyroid 04/22/2015   Seen on CTA 04/21/15: Contains multiple nodules measuring up to 2.1 cm in size, TSH wnl.  Needs thyroid ultrasound and biopsy  . Heart murmur   . HIV (human immunodeficiency virus infection) (H[facility])   . HTN (hypertension)   . Immune deficiency disorder (H[facility])   . Stroke [facility])    Review of Systems:   Constitutional: Negative for chills and fever.  Respiratory: Negative for shortness of breath.   Cardiovascular: Negative for chest pain and leg swelling.  Gastrointestinal: Negative for abdominal pain, nausea and vomiting.  Neurological: Negative for dizziness and headaches.   Physical Exam:  Vitals:   02/24/20 1053  BP: 115/74  Pulse: (!) 58  Temp: 97.9 F (36.6 C)  TempSrc: Oral  SpO2: 99%  Weight: 153 lb 11.2 oz (69.7 kg)  Height: 5\' 9"  (1.753 m)   Constitutional: No acute distress.  Head: Normocephalic and atraumatic.  Eyes: Conjunctivae are normal, EOM nl Cardiovascular: RRR, nl S1S2, no murmur, no LEE Respiratory: Effort normal and breath sounds normal. No respiratory distress. No wheezes.  GI: Soft. Bowel sounds are normal. No distension. There is no tenderness.  Neurological: Is alert and oriented x 3   Skin: Not diaphoretic. No erythema.  Psychiatric: Normal mood and affect. Behavior is normal. Judgment and thought content normal.    Assessment & Plan:   See Encounters Tab for problem based charting.  Patient discussed with Dr. 

## 2020-02-24 NOTE — Patient Instructions (Signed)
Thank you for allowing [place] to provide your care today. Today we discussed your blood pressure. Your blood pressure today is normal. Please continue great job of taking your medications and bring your blood pressure logs with you next time.   I have ordered labs for you. I will call if any are abnormal.    Today we made no changes to your medications.    Please come back to clinic in 6 months or earlier if needed. As always, if having severe symptoms, please seek medical attention at emergency room. Should you have any questions or concerns please call the internal medicine clinic at [phone].    Thank you!

## 2020-02-25 LAB — BMP8+ANION GAP
Anion Gap: 13 mmol/L (ref 10.0–18.0)
BUN/Creatinine Ratio: 15 (ref 10–24)
BUN: 19 mg/dL (ref 8–27)
CO2: 28 mmol/L (ref 20–29)
Calcium: 9.5 mg/dL (ref 8.6–10.2)
Chloride: 102 mmol/L (ref 96–106)
Creatinine, Ser: 1.25 mg/dL (ref 0.76–1.27)
GFR calc Af Amer: 65 mL/min/{1.73_m2} (ref >59)
GFR calc non Af Amer: 56 mL/min/{1.73_m2} — ABNORMAL LOW (ref >59)
Glucose: 97 mg/dL (ref 65–99)
Potassium: 4 mmol/L (ref 3.5–5.2)
Sodium: 143 mmol/L (ref 134–144)

## 2020-02-26 ENCOUNTER — Encounter: Payer: Self-pay | Admitting: Internal Medicine

## 2020-02-26 NOTE — Assessment & Plan Note (Signed)
BP today is well controlled at 115/74. heart rate 58 Currently on: Amlodipine 5mg  Daily and  Lisinopril-HCTZ 40-25mg  Daily  -Continue current medications -BMP today

## 2020-02-26 NOTE — Assessment & Plan Note (Signed)
Patient follows up with ID. He has future lab order (Ordered by Dr. Rosetta Posner.). (We were not able to obtain the lab here today given the orders are for [facility] and need to be changed to [facility]). Informed patient about furtur orders for blood work and to follow up with ID.

## 2020-03-01 ENCOUNTER — Other Ambulatory Visit: Payer: Self-pay | Admitting: Internal Medicine

## 2020-03-01 DIAGNOSIS — B20 Human immunodeficiency virus [HIV] disease: Secondary | ICD-10-CM

## 2020-03-01 NOTE — Progress Notes (Signed)
[facility] Medicine Clinic Attending  Case discussed with Dr. Masoudi at the time of the visit.  We reviewed the resident's history and exam and pertinent patient test results.  I agree with the assessment, diagnosis, and plan of care documented in the resident's note.  Ahmere Hemenway, M.D., Ph.D.  

## 2020-03-09 MED FILL — BIKTARVY 50-200-25 MG TABS: 50-200-25 | 30 days supply | Qty: 30 | Fill #0

## 2020-03-13 ENCOUNTER — Other Ambulatory Visit: Payer: Self-pay | Admitting: Internal Medicine

## 2020-03-13 DIAGNOSIS — E7849 Other hyperlipidemia: Secondary | ICD-10-CM

## 2020-03-13 DIAGNOSIS — I10 Essential (primary) hypertension: Secondary | ICD-10-CM

## 2020-03-13 NOTE — Telephone Encounter (Signed)
Please verify 20/12.5 dosage is correct.  See LOV note on 02/24/20 which states b/p was well controlled, but increased dose noted. Thanks, SChaplin, RN,BSN

## 2020-03-14 MED ORDER — AMLODIPINE BESYLATE 5 MG PO TABS: 5.0000 mg | ORAL_TABLET | Freq: Every day | ORAL | 0 refills | Status: DC

## 2020-03-14 MED ORDER — ATORVASTATIN CALCIUM 40 MG PO TABS: ORAL_TABLET | ORAL | 0 refills | Status: DC

## 2020-03-20 ENCOUNTER — Other Ambulatory Visit: Payer: Medicare Other

## 2020-03-22 ENCOUNTER — Other Ambulatory Visit: Payer: Medicare Other

## 2020-03-22 ENCOUNTER — Other Ambulatory Visit: Payer: Self-pay

## 2020-03-22 DIAGNOSIS — E7849 Other hyperlipidemia: Secondary | ICD-10-CM

## 2020-03-22 DIAGNOSIS — Z113 Encounter for screening for infections with a predominantly sexual mode of transmission: Secondary | ICD-10-CM

## 2020-03-22 DIAGNOSIS — B20 Human immunodeficiency virus [HIV] disease: Secondary | ICD-10-CM | POA: Diagnosis not present

## 2020-03-23 LAB — T-HELPER CELL (CD4) - (RCID CLINIC ONLY)
CD4 % Helper T Cell: 15 % — ABNORMAL LOW (ref 33–65)
CD4 T Cell Abs: 263 /uL — ABNORMAL LOW (ref 400–1790)

## 2020-03-24 LAB — COMPLETE METABOLIC PANEL WITH GFR
AG Ratio: 1.4 (calc) (ref 1.0–2.5)
ALT: 11 U/L (ref 9–46)
AST: 16 U/L (ref 10–35)
Albumin: 3.8 g/dL (ref 3.6–5.1)
Alkaline phosphatase (APISO): 92 U/L (ref 35–144)
BUN/Creatinine Ratio: 16 (calc) (ref 6–22)
BUN: 25 mg/dL (ref 7–25)
CO2: 31 mmol/L (ref 20–32)
Calcium: 9.3 mg/dL (ref 8.6–10.3)
Chloride: 104 mmol/L (ref 98–110)
Creat: 1.58 mg/dL — ABNORMAL HIGH (ref 0.70–1.18)
GFR, Est African American: 49 mL/min/{1.73_m2} — ABNORMAL LOW (ref >=60)
GFR, Est Non African American: 42 mL/min/{1.73_m2} — ABNORMAL LOW (ref >=60)
Globulin: 2.8 g/dL (calc) (ref 1.9–3.7)
Glucose, Bld: 102 mg/dL — ABNORMAL HIGH (ref 65–99)
Potassium: 4.3 mmol/L (ref 3.5–5.3)
Sodium: 142 mmol/L (ref 135–146)
Total Bilirubin: 0.7 mg/dL (ref 0.2–1.2)
Total Protein: 6.6 g/dL (ref 6.1–8.1)

## 2020-03-24 LAB — LIPID PANEL
Cholesterol: 91 mg/dL (ref <200)
HDL: 38 mg/dL — ABNORMAL LOW (ref >=40)
LDL Cholesterol (Calc): 41 mg/dL (calc)
Non-HDL Cholesterol (Calc): 53 mg/dL (calc) (ref <130)
Total CHOL/HDL Ratio: 2.4 (calc) (ref <5.0)
Triglycerides: 50 mg/dL (ref <150)

## 2020-03-24 LAB — CBC WITH DIFFERENTIAL/PLATELET
Absolute Monocytes: 390 cells/uL (ref 200–950)
Basophils Absolute: 10 cells/uL (ref 0–200)
Basophils Relative: 0.2 %
Eosinophils Absolute: 130 cells/uL (ref 15–500)
Eosinophils Relative: 2.5 %
HCT: 42.5 % (ref 38.5–50.0)
Hemoglobin: 14.4 g/dL (ref 13.2–17.1)
Lymphs Abs: 1752 cells/uL (ref 850–3900)
MCH: 29.2 pg (ref 27.0–33.0)
MCHC: 33.9 g/dL (ref 32.0–36.0)
MCV: 86.2 fL (ref 80.0–100.0)
MPV: 10.6 fL (ref 7.5–12.5)
Monocytes Relative: 7.5 %
Neutro Abs: 2917 cells/uL (ref 1500–7800)
Neutrophils Relative %: 56.1 %
Platelets: 127 10*3/uL — ABNORMAL LOW (ref 140–400)
RBC: 4.93 10*6/uL (ref 4.20–5.80)
RDW: 12.4 % (ref 11.0–15.0)
Total Lymphocyte: 33.7 %
WBC: 5.2 10*3/uL (ref 3.8–10.8)

## 2020-03-24 LAB — HIV-1 RNA QUANT-NO REFLEX-BLD
HIV 1 RNA Quant: <20 copies/mL
HIV-1 RNA Quant, Log: <1.3 Log copies/mL

## 2020-03-24 LAB — RPR: RPR Ser Ql: NONREACTIVE

## 2020-04-03 ENCOUNTER — Other Ambulatory Visit: Payer: Self-pay

## 2020-04-03 ENCOUNTER — Ambulatory Visit (INDEPENDENT_AMBULATORY_CARE_PROVIDER_SITE_OTHER): Payer: Medicare Other | Admitting: Internal Medicine

## 2020-04-03 ENCOUNTER — Encounter: Payer: Self-pay | Admitting: Internal Medicine

## 2020-04-03 VITALS — BP 115/69 | HR 74 | Temp 98.1°F | Wt 153.0 lb

## 2020-04-03 DIAGNOSIS — B20 Human immunodeficiency virus [HIV] disease: Secondary | ICD-10-CM

## 2020-04-03 DIAGNOSIS — Z113 Encounter for screening for infections with a predominantly sexual mode of transmission: Secondary | ICD-10-CM

## 2020-04-03 DIAGNOSIS — N289 Disorder of kidney and ureter, unspecified: Secondary | ICD-10-CM

## 2020-04-03 MED FILL — BIKTARVY 50-200-25 MG TABS: 50-200-25 | 30 days supply | Qty: 30 | Fill #1

## 2020-04-03 NOTE — Assessment & Plan Note (Signed)
He continues to do well and no new issues.  rtc 6 months.

## 2020-04-03 NOTE — Assessment & Plan Note (Signed)
Screened negative 

## 2020-04-03 NOTE — Assessment & Plan Note (Signed)
Stable creat, will continue to monitor.

## 2020-04-03 NOTE — Progress Notes (Signed)
   Subjective:    Patient ID: Thomas [name], male    DOB: 1946/09/01, 74 y.o.   MRN: [id number]  HPI Here for follow up of HIV Has been on Biktarvy since last year.   No complaints.  CD4 of 263 and viral load < 20.  Creat stable at 1.58.  No new concerns.  Is looking for a new job to keep himself active.     Review of Systems  Constitutional: Negative for unexpected weight change.  Gastrointestinal: Negative for diarrhea and nausea.  Skin: Negative for rash.       Objective:   Physical Exam Constitutional:      General: He is not in acute distress.    Appearance: He is well-developed.  Eyes:     General: No scleral icterus. Cardiovascular:     Rate and Rhythm: Normal rate and regular rhythm.     Heart sounds: Normal heart sounds.  Pulmonary:     Effort: Pulmonary effort is normal. No respiratory distress.     Breath sounds: Normal breath sounds.  Skin:    Findings: No rash.  Neurological:     Mental Status: He is alert.   SH: no tobacco        Assessment & Plan:

## 2020-04-27 ENCOUNTER — Other Ambulatory Visit: Payer: Self-pay | Admitting: Internal Medicine

## 2020-04-27 DIAGNOSIS — B20 Human immunodeficiency virus [HIV] disease: Secondary | ICD-10-CM

## 2020-05-01 MED FILL — BIKTARVY 50-200-25 MG TABS: 50-200-25 | 30 days supply | Qty: 30 | Fill #0

## 2020-05-29 MED FILL — BIKTARVY 50-200-25 MG TABS: 50-200-25 | 30 days supply | Qty: 30 | Fill #1

## 2020-06-12 ENCOUNTER — Other Ambulatory Visit: Payer: Self-pay | Admitting: Internal Medicine

## 2020-06-12 DIAGNOSIS — E7849 Other hyperlipidemia: Secondary | ICD-10-CM

## 2020-06-12 DIAGNOSIS — I10 Essential (primary) hypertension: Secondary | ICD-10-CM

## 2020-06-26 MED FILL — BIKTARVY 50-200-25 MG TABS: 50-200-25 | 30 days supply | Qty: 30 | Fill #2

## 2020-07-20 MED FILL — BIKTARVY 50-200-25 MG TABS: 50-200-25 | 30 days supply | Qty: 30 | Fill #3

## 2020-08-11 ENCOUNTER — Encounter: Payer: Self-pay | Admitting: *Deleted

## 2020-08-11 NOTE — Progress Notes (Signed)

## 2020-08-11 NOTE — Progress Notes (Signed)
Things That May Be Affecting Your Health:  Alcohol  Hearing loss  Pain    Depression  Home Safety  Sexual Health   Diabetes  Lack of physical activity  Stress   Difficulty with daily activities  Loneliness  Tiredness   Drug use x Medicines  Tobacco use   Falls  Motor Vehicle Safety  Weight   Food choices  Oral Health  Other    YOUR PERSONALIZED HEALTH PLAN : 1. Schedule your next subsequent Medicare Wellness visit in one year 2. Attend all of your regular appointments to address your medical issues 3. Complete the preventative screenings and services   Annual Wellness Visit   Medicare Covered Preventative Screenings and Services  Services & Screenings Men and Women Who How Often Need? Date of Last Service Action  Abdominal Aortic Aneurysm Adults with AAA risk factors Once x    Alcohol Misuse and Counseling All Adults Screening once a year if no alcohol misuse. Counseling up to 4 face to face sessions.     Bone Density Measurement  Adults at risk for osteoporosis Once every 2 yrs     Lipid Panel Z13.6 All adults without CV disease Once every 5 yrs     Colorectal Cancer   Stool sample or  Colonoscopy All adults 50 and older   Once every year  Every 10 years x    Depression All Adults Once a year  Today   Diabetes Screening Blood glucose, post glucose load, or GTT Z13.1  All adults at risk  Pre-diabetics  Once per year  Twice per year x    Diabetes  Self-Management Training All adults Diabetics 10 hrs first year; 2 hours subsequent years. Requires Copay     Glaucoma  Diabetics  Family history of glaucoma  African Americans 50 yrs +  Hispanic Americans 65 yrs + Annually - requires coppay x    Hepatitis C Z72.89 or F19.20  High Risk for HCV  Born between 1945 and 1965  Annually  Once     HIV Z11.4 All adults based on risk  Annually btw ages 9 & 26 regardless of risk  Annually > 65 yrs if at increased risk     Lung Cancer Screening Asymptomatic adults  aged 50-77 with 30 pack yr history and current smoker OR quit within the last 15 yrs Annually Must have counseling and shared decision making documentation before first screen     Medical Nutrition Therapy Adults with   Diabetes  Renal disease  Kidney transplant within past 3 yrs 3 hours first year; 2 hours subsequent years     Obesity and Counseling All adults Screening once a year Counseling if BMI 30 or higher  Today   Tobacco Use Counseling Adults who use tobacco  Up to 8 visits in one year     Vaccines Z23  Hepatitis B  Influenza   Pneumonia  Adults   Once  Once every flu season  Two different vaccines separated by one year x    Next Annual Wellness Visit People with Medicare Every year  Today     Services & Screenings Women Who How Often Need  Date of Last Service Action  Mammogram  Z12.31 Women over 40 One baseline ages 42-39. Annually ager 40 yrs+     Pap tests All women Annually if high risk. Every 2 yrs for normal risk women     Screening for cervical cancer with   Pap (Z01.419 nl or Z01.411abnl) &  HPV Z11.51 Women aged 51 to 43 Once every 5 yrs     Screening pelvic and breast exams All women Annually if high risk. Every 2 yrs for normal risk women     Sexually Transmitted Diseases  Chlamydia  Gonorrhea  Syphilis All at risk adults Annually for non pregnant females at increased risk         [place] Men Who How Ofter Need  Date of Last Service Action  Prostate Cancer - DRE & PSA Men over 50 Annually.  DRE might require a copay.     Sexually Transmitted Diseases  Syphilis All at risk adults Annually for men at increased risk

## 2020-08-17 MED FILL — BIKTARVY 50-200-25 MG TABS: 50-200-25 | 30 days supply | Qty: 30 | Fill #4

## 2020-09-12 ENCOUNTER — Other Ambulatory Visit: Payer: Self-pay | Admitting: Student

## 2020-09-12 DIAGNOSIS — I10 Essential (primary) hypertension: Secondary | ICD-10-CM | POA: Diagnosis not present

## 2020-09-12 DIAGNOSIS — E663 Overweight: Secondary | ICD-10-CM | POA: Diagnosis not present

## 2020-09-12 DIAGNOSIS — Z0000 Encounter for general adult medical examination without abnormal findings: Secondary | ICD-10-CM | POA: Diagnosis not present

## 2020-09-12 DIAGNOSIS — Z008 Encounter for other general examination: Secondary | ICD-10-CM | POA: Diagnosis not present

## 2020-09-12 DIAGNOSIS — E785 Hyperlipidemia, unspecified: Secondary | ICD-10-CM | POA: Diagnosis not present

## 2020-09-12 DIAGNOSIS — Z6825 Body mass index (BMI) 25.0-25.9, adult: Secondary | ICD-10-CM | POA: Diagnosis not present

## 2020-09-12 DIAGNOSIS — B20 Human immunodeficiency virus [HIV] disease: Secondary | ICD-10-CM | POA: Diagnosis not present

## 2020-09-14 MED FILL — BIKTARVY 50-200-25 MG TABS: 50-200-25 | 30 days supply | Qty: 30 | Fill #5

## 2020-10-03 ENCOUNTER — Other Ambulatory Visit: Payer: Self-pay

## 2020-10-03 ENCOUNTER — Other Ambulatory Visit: Payer: TRICARE For Life (TFL)

## 2020-10-03 DIAGNOSIS — B20 Human immunodeficiency virus [HIV] disease: Secondary | ICD-10-CM

## 2020-10-04 DIAGNOSIS — H9190 Unspecified hearing loss, unspecified ear: Secondary | ICD-10-CM | POA: Diagnosis not present

## 2020-10-04 DIAGNOSIS — E785 Hyperlipidemia, unspecified: Secondary | ICD-10-CM | POA: Diagnosis not present

## 2020-10-04 DIAGNOSIS — Z8673 Personal history of transient ischemic attack (TIA), and cerebral infarction without residual deficits: Secondary | ICD-10-CM | POA: Diagnosis not present

## 2020-10-04 DIAGNOSIS — Z0001 Encounter for general adult medical examination with abnormal findings: Secondary | ICD-10-CM | POA: Diagnosis not present

## 2020-10-04 DIAGNOSIS — Z008 Encounter for other general examination: Secondary | ICD-10-CM | POA: Diagnosis not present

## 2020-10-04 DIAGNOSIS — Z6825 Body mass index (BMI) 25.0-25.9, adult: Secondary | ICD-10-CM | POA: Diagnosis not present

## 2020-10-04 DIAGNOSIS — I10 Essential (primary) hypertension: Secondary | ICD-10-CM | POA: Diagnosis not present

## 2020-10-04 DIAGNOSIS — H903 Sensorineural hearing loss, bilateral: Secondary | ICD-10-CM | POA: Diagnosis not present

## 2020-10-04 DIAGNOSIS — E663 Overweight: Secondary | ICD-10-CM | POA: Diagnosis not present

## 2020-10-04 LAB — T-HELPER CELL (CD4) - (RCID CLINIC ONLY)
CD4 % Helper T Cell: 16 % — ABNORMAL LOW (ref 33–65)
CD4 T Cell Abs: 257 /uL — ABNORMAL LOW (ref 400–1790)

## 2020-10-05 ENCOUNTER — Other Ambulatory Visit: Payer: Self-pay | Admitting: Internal Medicine

## 2020-10-05 DIAGNOSIS — B20 Human immunodeficiency virus [HIV] disease: Secondary | ICD-10-CM

## 2020-10-06 LAB — HIV-1 RNA QUANT-NO REFLEX-BLD
HIV 1 RNA Quant: <20 Copies/mL
HIV-1 RNA Quant, Log: <1.3 Log cps/mL

## 2020-10-11 MED FILL — BIKTARVY 50-200-25 MG TABS: 50-200-25 | 30 days supply | Qty: 30 | Fill #0

## 2020-10-16 ENCOUNTER — Telehealth: Payer: Self-pay

## 2020-10-16 NOTE — Telephone Encounter (Signed)
[facility] Patient Advocate Encounter   I was successful in securing patient a $7500 grant from [employer] ([employer]) to provide copayment coverage for Biktarvy.  This will make the out of pocket cost $0.00.     I have spoken with the patient.    The billing information is as follows and has been shared with [facility].           Patient knows to call the office with questions or concerns.  Sarvesh Meddaugh, CPhT [facility] Phone: [phone] Fax:  [phone]  

## 2020-10-18 ENCOUNTER — Encounter: Payer: Medicare Other | Admitting: Internal Medicine

## 2020-10-24 ENCOUNTER — Encounter: Payer: TRICARE For Life (TFL) | Admitting: Internal Medicine

## 2020-11-01 ENCOUNTER — Encounter: Payer: Self-pay | Admitting: Internal Medicine

## 2020-11-08 MED FILL — BIKTARVY 50-200-25 MG TABS: 50-200-25 | 30 days supply | Qty: 30 | Fill #1

## 2020-11-13 ENCOUNTER — Telehealth: Payer: Self-pay

## 2020-11-13 ENCOUNTER — Encounter: Payer: TRICARE For Life (TFL) | Admitting: Internal Medicine

## 2020-11-13 NOTE — Telephone Encounter (Signed)
Called patient regarding his appointment with our office. Patient states that his transmission blew today and will not be able to make it to his appointment today. Rescheduled appointment for 4/11. Requested patient call office a week in advance if he needs assistance with transportation.  Will call if he needs refills.

## 2020-11-14 ENCOUNTER — Other Ambulatory Visit: Payer: Self-pay | Admitting: Internal Medicine

## 2020-11-14 ENCOUNTER — Encounter: Payer: Self-pay | Admitting: Student

## 2020-11-14 ENCOUNTER — Ambulatory Visit (INDEPENDENT_AMBULATORY_CARE_PROVIDER_SITE_OTHER): Payer: Medicare HMO | Admitting: Student

## 2020-11-14 ENCOUNTER — Other Ambulatory Visit: Payer: Self-pay

## 2020-11-14 DIAGNOSIS — B20 Human immunodeficiency virus [HIV] disease: Secondary | ICD-10-CM

## 2020-11-14 DIAGNOSIS — Z0000 Encounter for general adult medical examination without abnormal findings: Secondary | ICD-10-CM

## 2020-11-14 NOTE — Patient Instructions (Addendum)
Things That May Be Affecting Your Health:  Alcohol X Hearing loss  Pain    Depression  Home Safety  Sexual Health   Diabetes  Lack of physical activity  Stress   Difficulty with daily activities  Loneliness  Tiredness   Drug use  Medicines  Tobacco use   Falls  Motor Vehicle Safety  Weight   Food choices  Oral Health  Other    YOUR PERSONALIZED HEALTH PLAN : 1. Schedule your next subsequent Medicare Wellness visit in one year 2. Attend all of your regular appointments to address your medical issues 3. Complete the preventative screenings and services 4. A referral has been placed for you to see an eye doctor 5. Routine appt to see Dr. Ephriam [name] on 11/23/2020 at 3:15   Annual Wellness Visit                       Medicare Covered Preventative Screenings and Services  Services & Screenings Men and Women Who How Often Need? Date of Last Service Action  Abdominal Aortic Aneurysm Adults with AAA risk factors Once     Alcohol Misuse and Counseling All Adults Screening once a year if no alcohol misuse. Counseling up to 4 face to face sessions.     Bone Density Measurement  Adults at risk for osteoporosis Once every 2 yrs     Lipid Panel Z13.6 All adults without CV disease Once every 5 yrs     Colorectal Cancer   Stool sample or  Colonoscopy All adults 50 and older   Once every year  Every 10 years x  At appt on 11/23/2020  Depression All Adults Once a year  Today   Diabetes Screening Blood glucose, post glucose load, or GTT Z13.1  All adults at risk  Pre-diabetics  Once per year  Twice per year x    Diabetes  Self-Management Training All adults Diabetics 10 hrs first year; 2 hours subsequent years. Requires Copay     Glaucoma  Diabetics  Family history of glaucoma  African Americans 50 yrs +  Hispanic Americans 65 yrs + Annually - requires coppay x  Referral placed  Hepatitis C Z72.89 or F19.20  High Risk for HCV  Born  between 1945 and 1965  Annually  Once     HIV Z11.4 All adults based on risk  Annually btw ages 12 & 41 regardless of risk  Annually > 65 yrs if at increased risk     Lung Cancer Screening Asymptomatic adults aged 56-77 with 30 pack yr history and current smoker OR quit within the last 15 yrs Annually Must have counseling and shared decision making documentation before first screen     Medical Nutrition Therapy Adults with   Diabetes  Renal disease  Kidney transplant within past 3 yrs 3 hours first year; 2 hours subsequent years     Obesity and Counseling All adults Screening once a year Counseling if BMI 30 or higher  Today   Tobacco Use Counseling Adults who use tobacco  Up to 8 visits in one year     Vaccines Z23  Hepatitis B  Influenza   Pneumonia  Adults   Once  Once every flu season  Two different vaccines separated by one year x  Covid vaccines and yearly flu vaccine are recommended  Next Annual Wellness Visit People with Medicare Every year  Today     Services & Screenings Women Who How Often Need  Date of  Last Service Action  Mammogram  Z12.31 Women over 40 One baseline ages 57-39. Annually ager 40 yrs+     Pap tests All women Annually if high risk. Every 2 yrs for normal risk women     Screening for cervical cancer with   Pap (Z01.419 nl or Z01.411abnl) &  HPV Z11.51 Women aged 75 to 65 Once every 5 yrs     Screening pelvic and breast exams All women Annually if high risk. Every 2 yrs for normal risk women     Sexually Transmitted Diseases  Chlamydia  Gonorrhea  Syphilis All at risk adults Annually for non pregnant females at increased risk         Services & Screenings Men Who How Ofter Need  Date of Last Service Action  Prostate Cancer - DRE & PSA Men over 50 Annually.  DRE might require a copay.     Sexually Transmitted Diseases  Syphilis All at risk adults Annually for men at increased risk         Fall Prevention in the Home, Adult Falls can cause injuries and can happen to people of all ages. There are many things you can do to make your home safe and to help prevent falls. Ask for help when making these changes. What actions can I take to prevent falls? General Instructions  Use good lighting in all rooms. Replace any light bulbs that burn out.  Turn on the lights in dark areas. Use night-lights.  Keep items that you use often in easy-to-reach places. Lower the shelves around your home if needed.  Set up your furniture so you have a clear path. Avoid moving your furniture around.  Do not have throw rugs or other things on the floor that can make you trip.  Avoid walking on wet floors.  If any of your floors are uneven, fix them.  Add color or contrast paint or tape to clearly mark and help you see: ? Grab bars or handrails. ? First and last steps of staircases. ? Where the edge of each step is.  If you use a stepladder: ? Make sure that it is fully opened. Do not climb a closed stepladder. ? Make sure the sides of the stepladder are locked in place. ? Ask someone to hold the stepladder while you use it.  Know where your pets are when moving through your home. What can I do in the bathroom?  Keep the floor dry. Clean up any water on the floor right away.  Remove soap buildup in the tub or shower.  Use nonskid mats or decals on the floor of the tub or shower.  Attach bath mats securely with double-sided, nonslip rug tape.  If you need to sit down in the shower, use a plastic, nonslip stool.  Install grab bars by the toilet and in the tub and shower. Do not use towel bars as grab bars.      What can I do in the bedroom?  Make sure that you have a light by your bed that is easy to reach.  Do not use any sheets or blankets for your bed that hang to the floor.  Have a firm chair with side arms that you can use for support when you get dressed. What  can I do in the kitchen?  Clean up any spills right away.  If you need to reach something above you, use a step stool with a grab bar.  Keep electrical cords out  of the way.  Do not use floor polish or wax that makes floors slippery. What can I do with my stairs?  Do not leave any items on the stairs.  Make sure that you have a light switch at the top and the bottom of the stairs.  Make sure that there are handrails on both sides of the stairs. Fix handrails that are broken or loose.  Install nonslip stair treads on all your stairs.  Avoid having throw rugs at the top or bottom of the stairs.  Choose a carpet that does not hide the edge of the steps on the stairs.  Check carpeting to make sure that it is firmly attached to the stairs. Fix carpet that is loose or worn. What can I do on the outside of my home?  Use bright outdoor lighting.  Fix the edges of walkways and driveways and fix any cracks.  Remove anything that might make you trip as you walk through a door, such as a raised step or threshold.  Trim any bushes or trees on paths to your home.  Check to see if handrails are loose or broken and that both sides of all steps have handrails.  Install guardrails along the edges of any raised decks and porches.  Clear paths of anything that can make you trip, such as tools or rocks.  Have leaves, snow, or ice cleared regularly.  Use sand or salt on paths during winter.  Clean up any spills in your garage right away. This includes grease or oil spills. What other actions can I take?  Wear shoes that: ? Have a low heel. Do not wear high heels. ? Have rubber bottoms. ? Feel good on your feet and fit well. ? Are closed at the toe. Do not wear open-toe sandals.  Use tools that help you move around if needed. These include: ? Canes. ? Walkers. ? Scooters. ? Crutches.  Review your medicines with your doctor. Some medicines can make you feel dizzy. This can increase  your chance of falling. Ask your doctor what else you can do to help prevent falls. Where to find more information  Centers for Disease Control and Prevention, [employer]: FootballExhibition.com.br  [employer] on Aging: https://walker.com/ Contact a doctor if:  You are afraid of falling at home.  You feel weak, drowsy, or dizzy at home.  You fall at home. Summary  There are many simple things that you can do to make your home safe and to help prevent falls.  Ways to make your home safe include removing things that can make you trip and installing grab bars in the bathroom.  Ask for help when making these changes in your home. This information is not intended to replace advice given to you by your health care provider. Make sure you discuss any questions you have with your health care provider. Document Revised: 03/22/2020 Document Reviewed: 03/22/2020 Elsevier Patient Education  2021 Elsevier Inc.   Health Maintenance, Male Adopting a healthy lifestyle and getting preventive care are important in promoting health and wellness. Ask your health care provider about:  The right schedule for you to have regular tests and exams.  Things you can do on your own to prevent diseases and keep yourself healthy. What should I know about diet, weight, and exercise? Eat a healthy diet  Eat a diet that includes plenty of vegetables, fruits, low-fat dairy products, and lean protein.  Do not eat a lot of foods that are high in  solid fats, added sugars, or sodium.   Maintain a healthy weight Body mass index (BMI) is a measurement that can be used to identify possible weight problems. It estimates body fat based on height and weight. Your health care provider can help determine your BMI and help you achieve or maintain a healthy weight. Get regular exercise Get regular exercise. This is one of the most important things you can do for your health. Most adults should:  Exercise for at least 150 minutes each  week. The exercise should increase your heart rate and make you sweat (moderate-intensity exercise).  Do strengthening exercises at least twice a week. This is in addition to the moderate-intensity exercise.  Spend less time sitting. Even light physical activity can be beneficial. Watch cholesterol and blood lipids Have your blood tested for lipids and cholesterol at 76 years of age, then have this test every 5 years. You may need to have your cholesterol levels checked more often if:  Your lipid or cholesterol levels are high.  You are older than 76 years of age.  You are at high risk for heart disease. What should I know about cancer screening? Many types of cancers can be detected early and may often be prevented. Depending on your health history and family history, you may need to have cancer screening at various ages. This may include screening for:  Colorectal cancer.  Prostate cancer.  Skin cancer.  Lung cancer. What should I know about heart disease, diabetes, and high blood pressure? Blood pressure and heart disease  High blood pressure causes heart disease and increases the risk of stroke. This is more likely to develop in people who have high blood pressure readings, are of African descent, or are overweight.  Talk with your health care provider about your target blood pressure readings.  Have your blood pressure checked: ? Every 3-5 years if you are 21-41 years of age. ? Every year if you are 41 years old or older.  If you are between the ages of 57 and 22 and are a current or former smoker, ask your health care provider if you should have a one-time screening for abdominal aortic aneurysm (AAA). Diabetes Have regular diabetes screenings. This checks your fasting blood sugar level. Have the screening done:  Once every three years after age 66 if you are at a normal weight and have a low risk for diabetes.  More often and at a younger age if you are overweight or  have a high risk for diabetes. What should I know about preventing infection? Hepatitis B If you have a higher risk for hepatitis B, you should be screened for this virus. Talk with your health care provider to find out if you are at risk for hepatitis B infection. Hepatitis C Blood testing is recommended for:  Everyone born from [birth year] through 1966.  Anyone with known risk factors for hepatitis C. Sexually transmitted infections (STIs)  You should be screened each year for STIs, including gonorrhea and chlamydia, if: ? You are sexually active and are younger than 76 years of age. ? You are older than 76 years of age and your health care provider tells you that you are at risk for this type of infection. ? Your sexual activity has changed since you were last screened, and you are at increased risk for chlamydia or gonorrhea. Ask your health care provider if you are at risk.  Ask your health care provider about whether you are at high risk  for HIV. Your health care provider may recommend a prescription medicine to help prevent HIV infection. If you choose to take medicine to prevent HIV, you should first get tested for HIV. You should then be tested every 3 months for as long as you are taking the medicine. Follow these instructions at home: Lifestyle  Do not use any products that contain nicotine or tobacco, such as cigarettes, e-cigarettes, and chewing tobacco. If you need help quitting, ask your health care provider.  Do not use street drugs.  Do not share needles.  Ask your health care provider for help if you need support or information about quitting drugs. Alcohol use  Do not drink alcohol if your health care provider tells you not to drink.  If you drink alcohol: ? Limit how much you have to 0-2 drinks a day. ? Be aware of how much alcohol is in your drink. In the [place] one drink equals one 12 oz bottle of beer (355 mL), one 5 oz glass of wine (148 mL), or one 1 oz glass of  hard liquor (44 mL). General instructions  Schedule regular health, dental, and eye exams.  Stay current with your vaccines.  Tell your health care provider if: ? You often feel depressed. ? You have ever been abused or do not feel safe at home. Summary  Adopting a healthy lifestyle and getting preventive care are important in promoting health and wellness.  Follow your health care provider's instructions about healthy diet, exercising, and getting tested or screened for diseases.  Follow your health care provider's instructions on monitoring your cholesterol and blood pressure. This information is not intended to replace advice given to you by your health care provider. Make sure you discuss any questions you have with your health care provider. Document Revised: 08/12/2018 Document Reviewed: 08/12/2018 Elsevier Patient Education  2021 [employer]

## 2020-11-14 NOTE — Progress Notes (Signed)
This AWV is being conducted by TELEHEALTH - AUDIO only. The patient was located at home and I was located in [facility]. The patient's identity was confirmed using their DOB and current address. The patient or his/her legal guardian has consented to being evaluated through a telephone encounter and understands the associated risks (an examination cannot be done and the patient may need to come in for an appointment) / benefits (allows the patient to remain at home, decreasing exposure to coronavirus). I personally spent 23 minutes conducting the AWV.  Subjective:   Thomas [name] is a 75 y.o. male who presents for a Medicare Annual Wellness Visit.  The following items have been reviewed and updated today in the appropriate area in the EMR.   Health Risk Assessment  Height, weight, BMI, and BP Visual acuity if needed Depression screen Fall risk / safety level Advance directive discussion Medical and family history were reviewed and updated Updating list of other providers & suppliers Medication reconciliation, including over the counter medicines Cognitive screen Written screening schedule Risk Factor list Personalized health advice, risky behaviors, and treatment advice  Social History   Social History Narrative   Current Social History 11/14/2020      Patient lives alone in a home which is 2 stories. There are no steps up to the entrance the patient uses.       Patient's method of transportation is personal car.      The highest level of education was high school diploma.      The patient currently retired from print shop.      Identified important Relationships are "My sister, Thurston Hole."       Pets : None       Interests / Fun: "Walking, and shooting rifle at the rifle range."       Current Stressors: "I don't have any right now."      Religious / Personal Beliefs: "[employer]"       L. Vidalia Serpas, BSN, RN-BC               Objective:    Vitals: There were no vitals  taken for this visit. Vitals are unable to obtained due to COVID-19 public health emergency  Activities of Daily Living In your present state of health, do you have any difficulty performing the following activities: 11/14/2020 02/24/2020  Hearing? Y Y  Comment Bilat Bilat  Vision? N N  Difficulty concentrating or making decisions? N N  Walking or climbing stairs? N N  Dressing or bathing? N N  Doing errands, shopping? N N  Some recent data might be hidden    Goals Goals    . Maintain same level of exercise     Walking 20 minutes 6 days per week       Fall Risk Fall Risk  11/14/2020 04/03/2020 02/24/2020 02/17/2020 09/15/2019  Falls in the past year? 0 0 0 0 0  Number falls in past yr: - - - 0 0  Injury with Fall? - - - 0 0  Risk for fall due to : No Fall Risks - No Fall Risks - -  Follow up Education provided;Falls prevention discussed - - - -   CDC Handout on Fall Prevention and Handout on Home Exercise Program, Access codes [id number] and [id number] mailed to patient with exercise band.   Depression Screen PHQ 2/9 Scores 11/14/2020 04/03/2020 02/17/2020 09/15/2019  PHQ - 2 Score 0 0 0 0  PHQ- 9 Score 0 - 0 -  Cognitive Testing Six-Item Cognitive Screener   "I would like to ask you some questions that ask you to use your memory. I am going to name three objects. Please wait until I say all three words, then repeat them. Remember what they are  because I am going to ask you to name them again in a few minutes. Please repeat these words for me: APPLE--TABLE--PENNY." (Interviewer may repeat names 3 times if necessary but repetition not scored.)  Did patient correctly repeat all three words? Yes - may proceed with screen  What year is this? Correct What month is this? Correct What day of the week is this? Correct  What were the three objects I asked you to remember? . Apple Correct . Table Correct . Penny Correct  Score one point for each incorrect answer.  A score of 2 or more  points warrants additional investigation.  Patient's score 0   Assessment and Plan:     Patient would like referral to eye doctor Patient declines to receive yearly flu vaccine and Covid vaccines Appt made with PCP for 11/23/2020. Patient is willing to do stool test at that time.  During the course of the visit the patient was educated and counseled about appropriate screening and preventive services as documented in the assessment and plan.  The printed AVS was given to the patient and included an updated screening schedule, a list of risk factors, and personalized health advice.        Fredderick Severance, RN  11/14/2020

## 2020-11-14 NOTE — Progress Notes (Signed)
I discussed the AWV findings with the RN who conducted the visit. I was present in the office suite and immediately available to provide assistance and direction throughout the time the service was provided.   

## 2020-11-15 NOTE — Progress Notes (Signed)
Internal Medicine Clinic Attending  Case discussed with Dr. Claudette Laws.  We reviewed the AWV findings.  I agree with the assessment, diagnosis, and plan of care documented in the AWV note.  Thomas [name] will need additional cognitive screening at subsequent visit given his score of 0/3 on 3 word recall.

## 2020-11-23 ENCOUNTER — Other Ambulatory Visit: Payer: Self-pay

## 2020-11-23 ENCOUNTER — Ambulatory Visit (INDEPENDENT_AMBULATORY_CARE_PROVIDER_SITE_OTHER): Payer: Medicare HMO | Admitting: Internal Medicine

## 2020-11-23 ENCOUNTER — Encounter: Payer: Self-pay | Admitting: Internal Medicine

## 2020-11-23 VITALS — BP 118/77 | HR 66 | Temp 98.5°F | Ht 69.0 in | Wt 166.9 lb

## 2020-11-23 DIAGNOSIS — I77810 Thoracic aortic ectasia: Secondary | ICD-10-CM

## 2020-11-23 DIAGNOSIS — I671 Cerebral aneurysm, nonruptured: Secondary | ICD-10-CM

## 2020-11-23 DIAGNOSIS — Z0000 Encounter for general adult medical examination without abnormal findings: Secondary | ICD-10-CM

## 2020-11-23 DIAGNOSIS — N1831 Chronic kidney disease, stage 3a: Secondary | ICD-10-CM | POA: Diagnosis not present

## 2020-11-23 DIAGNOSIS — Z8673 Personal history of transient ischemic attack (TIA), and cerebral infarction without residual deficits: Secondary | ICD-10-CM | POA: Diagnosis not present

## 2020-11-23 DIAGNOSIS — I10 Essential (primary) hypertension: Secondary | ICD-10-CM

## 2020-11-24 ENCOUNTER — Other Ambulatory Visit: Payer: Self-pay

## 2020-11-24 ENCOUNTER — Encounter: Payer: Self-pay | Admitting: Internal Medicine

## 2020-11-24 ENCOUNTER — Other Ambulatory Visit: Payer: Self-pay | Admitting: Student

## 2020-11-24 DIAGNOSIS — Z8673 Personal history of transient ischemic attack (TIA), and cerebral infarction without residual deficits: Secondary | ICD-10-CM | POA: Insufficient documentation

## 2020-11-24 DIAGNOSIS — I10 Essential (primary) hypertension: Secondary | ICD-10-CM

## 2020-11-24 DIAGNOSIS — I63312 Cerebral infarction due to thrombosis of left middle cerebral artery: Secondary | ICD-10-CM

## 2020-11-24 LAB — BASIC METABOLIC PANEL
BUN/Creatinine Ratio: 15 (ref 10–24)
BUN: 22 mg/dL (ref 8–27)
CO2: 27 mmol/L (ref 20–29)
Calcium: 9.1 mg/dL (ref 8.6–10.2)
Chloride: 100 mmol/L (ref 96–106)
Creatinine, Ser: 1.51 mg/dL — ABNORMAL HIGH (ref 0.76–1.27)
Glucose: 103 mg/dL — ABNORMAL HIGH (ref 65–99)
Potassium: 3.9 mmol/L (ref 3.5–5.2)
Sodium: 142 mmol/L (ref 134–144)
eGFR: 48 mL/min/{1.73_m2} — ABNORMAL LOW (ref >59)

## 2020-11-24 NOTE — Assessment & Plan Note (Signed)
Current medications: amlodipine 5mg  daily, lisinopril-hctz 40-25mg  daily Denies adverse medication effects. Blood pressure is well controlled in the office today. BP Readings from Last 3 Encounters:  11/23/20 118/77  04/03/20 115/69  02/24/20 115/74   BMP from today is stable  Plan -continue current management -repeat BMP in 58mo

## 2020-11-24 NOTE — Assessment & Plan Note (Addendum)
Incidentally noted on a neck CTA in 2016. Report read as "Ectatic aortic arch measuring 3.8 cm in diameter distally, with the visualized distal ascending aorta measuring 3.7 cm". I reviewed his echo from 2017 which showed a tricuspic aortic valve with mild-moderate regurg and sclerosis without stenosis.Marland Kitchen He denies any chest pain today and is he fairly active.  Plan -Aortic CTA ordered for re-evaluation. Would recommend repeating a CTA in 6 months for better monitoring of growth.

## 2020-11-24 NOTE — Assessment & Plan Note (Signed)
Based on my understanding of the current guidelines, it seems that this will require surveillance as well. Will clarify that with my attending and order follow up imaging if appropriate.

## 2020-11-24 NOTE — Assessment & Plan Note (Signed)
Stable on today's labs. Blood pressure is at goal.  -repeat labs in 29mo

## 2020-11-24 NOTE — Progress Notes (Signed)
Office Visit   Patient ID: Thomas [name], male    DOB: May 08, 1946, 75 y.o.   MRN: [id number]  Subjective:  CC: chronic hypertension, history of stroke, HIV  Thomas [name] is a 75 y.o. year old male who presents for follow up of his chronic medical conditions. Please refer to problem based charting for assessment and plan.  ACTIVE MEDICATIONS   Outpatient Medications Prior to Visit  Medication Sig Dispense Refill  . amLODipine (NORVASC) 5 MG tablet Take 1 tablet by mouth once daily 90 tablet 0  . aspirin EC 325 MG tablet Take 1 tablet (325 mg total) by mouth daily. 30 tablet 0  . atorvastatin (LIPITOR) 40 MG tablet TAKE 1 TABLET BY MOUTH ONCE DAILY AT 6  PM IN THE EVENING 90 tablet 0  . BIKTARVY 50-200-25 MG TABS tablet TAKE 1 TABLET BY MOUTH DAILY. 30 tablet 5  . lisinopril-hydrochlorothiazide (ZESTORETIC) 20-12.5 MG tablet Take 2 tablets by mouth once daily 180 tablet 0   No facility-administered medications prior to visit.     Objective:   BP 118/77 (BP Location: Left Arm, Patient Position: Sitting, Cuff Size: Normal)   Pulse 66   Temp 98.5 F (36.9 C) (Oral)   Ht 5\' 9"  (1.753 m)   Wt 166 lb 14.4 oz (75.7 kg)   SpO2 99% Comment: room air  BMI 24.65 kg/m  Wt Readings from Last 3 Encounters:  11/23/20 166 lb 14.4 oz (75.7 kg)  04/03/20 153 lb (69.4 kg)  02/24/20 153 lb 11.2 oz (69.7 kg)   BP Readings from Last 3 Encounters:  11/23/20 118/77  04/03/20 115/69  02/24/20 115/74   Physical exam General: well appearing in NAD Cardiac: RRR, no murmur, no LE edema Pulm: lungs clear  Health Maintenance:   Health Maintenance  Topic Date Due  . COLONOSCOPY (Pts 45-49yrs Insurance coverage will need to be confirmed)  Never done  . COLON CANCER SCREENING ANNUAL FOBT  10/28/2019  . COVID-19 Vaccine (1) 11/30/2020 (Originally 08/04/1958)  . INFLUENZA VACCINE  12/31/2020 (Originally 04/02/2020)  . TETANUS/TDAP  02/27/2027  . Hepatitis C Screening  Completed  . PNA vac Low  Risk Adult  Completed  . HPV VACCINES  Aged Out     Assessment & Plan:   Problem List Items Addressed This Visit      Cardiovascular and Mediastinum   Essential hypertension - Primary (Chronic)    Current medications: amlodipine 5mg  daily, lisinopril-hctz 40-25mg  daily Denies adverse medication effects. Blood pressure is well controlled in the office today. BP Readings from Last 3 Encounters:  11/23/20 118/77  04/03/20 115/69  02/24/20 115/74   BMP from today is stable  Plan -continue current management -repeat BMP in 28mo      Relevant Orders   Basic metabolic panel (Completed)   Ectatic thoracic aorta (HCC) (Chronic)    Incidentally noted on a neck CTA in 2016. Report read as "Ectatic aortic arch measuring 3.8 cm in diameter distally, with the visualized distal ascending aorta measuring 3.7 cm". I reviewed his echo from 2017 which showed a tricuspic aortic valve with mild-moderate regurg and sclerosis without stenosis.2017 He denies any chest pain today and is he fairly active.  Plan -Aortic CTA ordered for re-evaluation. Would recommend repeating a CTA in 6 months for better monitoring of growth.       Relevant Orders   CT ANGIO CHEST AORTA W/CM & OR WO/CM   Posterior communicating artery aneurysm (Chronic)    Based on my understanding of  the current guidelines, it seems that this will require surveillance as well. Will clarify that with my attending and order follow up imaging if appropriate.        Genitourinary   CKD (chronic kidney disease) stage 3, GFR 30-59 ml/min (HCC) (Chronic)    Stable on today's labs. Blood pressure is at goal.  -repeat labs in 44mo        Other   Preventative health care (Chronic)    Due for CRC screening. Will discuss at his upcoming visit.      History of CVA (cerebrovascular accident)    Continue aspirin and statin therapy.        Return in about 3 months (around 02/23/2021).   Pt discussed with Dr. Johny Shock, MD Internal Medicine Resident PGY-2 [facility] Pager: [phone] 11/24/2020 4:16 PM

## 2020-11-24 NOTE — Assessment & Plan Note (Addendum)
Continue aspirin and statin therapy 

## 2020-11-24 NOTE — Assessment & Plan Note (Signed)
Due for CRC screening. Will discuss at his upcoming visit.

## 2020-11-28 ENCOUNTER — Other Ambulatory Visit: Payer: Self-pay | Admitting: Internal Medicine

## 2020-11-28 DIAGNOSIS — I671 Cerebral aneurysm, nonruptured: Secondary | ICD-10-CM

## 2020-11-28 NOTE — Assessment & Plan Note (Signed)
Based on my interpretation of current recommendations, will order a CTA for follow up. Discussed with patient. Would ideally get this done at the same time as the aortic CTA

## 2020-11-29 MED ORDER — LISINOPRIL-HYDROCHLOROTHIAZIDE 20-12.5 MG PO TABS: 2.0000 | ORAL_TABLET | Freq: Every day | ORAL | 0 refills | Status: DC

## 2020-11-29 MED ORDER — AMLODIPINE BESYLATE 5 MG PO TABS: 5.0000 mg | ORAL_TABLET | Freq: Every day | ORAL | 0 refills | Status: DC

## 2020-12-01 ENCOUNTER — Other Ambulatory Visit (HOSPITAL_COMMUNITY): Payer: Self-pay

## 2020-12-04 NOTE — Progress Notes (Signed)
Internal Medicine Clinic Attending  Case discussed with Dr. Christian  At the time of the visit.  We reviewed the resident's history and exam and pertinent patient test results.  I agree with the assessment, diagnosis, and plan of care documented in the resident's note.  

## 2020-12-05 ENCOUNTER — Other Ambulatory Visit (HOSPITAL_COMMUNITY): Payer: Self-pay

## 2020-12-05 MED FILL — Bictegravir-Emtricitabine-Tenofovir AF Tab 50-200-25 MG: ORAL | 30 days supply | Qty: 30 | Fill #0 | Status: AC

## 2020-12-06 ENCOUNTER — Other Ambulatory Visit (HOSPITAL_COMMUNITY): Payer: Self-pay

## 2020-12-06 ENCOUNTER — Other Ambulatory Visit: Payer: Self-pay | Admitting: Student

## 2020-12-06 DIAGNOSIS — E7849 Other hyperlipidemia: Secondary | ICD-10-CM

## 2020-12-11 ENCOUNTER — Ambulatory Visit: Payer: TRICARE For Life (TFL) | Admitting: Internal Medicine

## 2020-12-12 ENCOUNTER — Other Ambulatory Visit: Payer: TRICARE For Life (TFL)

## 2020-12-12 ENCOUNTER — Ambulatory Visit
Admission: RE | Admit: 2020-12-12 | Discharge: 2020-12-12 | Disposition: A | Discharge status: Home / Self Care | Payer: Medicare HMO | Source: Ambulatory Visit | Attending: Student in an Organized Health Care Education/Training Program | Admitting: Student in an Organized Health Care Education/Training Program

## 2020-12-12 ENCOUNTER — Ambulatory Visit
Admission: RE | Admit: 2020-12-12 | Discharge: 2020-12-12 | Disposition: A | Discharge status: Home / Self Care | Payer: TRICARE For Life (TFL) | Source: Ambulatory Visit | Attending: Student in an Organized Health Care Education/Training Program | Admitting: Student in an Organized Health Care Education/Training Program

## 2020-12-12 DIAGNOSIS — I6389 Other cerebral infarction: Secondary | ICD-10-CM | POA: Diagnosis not present

## 2020-12-12 DIAGNOSIS — I672 Cerebral atherosclerosis: Secondary | ICD-10-CM | POA: Diagnosis not present

## 2020-12-12 DIAGNOSIS — I77810 Thoracic aortic ectasia: Secondary | ICD-10-CM

## 2020-12-12 DIAGNOSIS — Q283 Other malformations of cerebral vessels: Secondary | ICD-10-CM | POA: Diagnosis not present

## 2020-12-12 DIAGNOSIS — I671 Cerebral aneurysm, nonruptured: Secondary | ICD-10-CM

## 2020-12-12 DIAGNOSIS — J3489 Other specified disorders of nose and nasal sinuses: Secondary | ICD-10-CM | POA: Diagnosis not present

## 2020-12-12 DIAGNOSIS — I712 Thoracic aortic aneurysm, without rupture: Secondary | ICD-10-CM | POA: Diagnosis not present

## 2020-12-12 MED ORDER — IOPAMIDOL (ISOVUE-370) INJECTION 76%
120.0000 mL | Freq: Once | INTRAVENOUS | Status: AC | PRN
  Administered 2020-12-12: 120 mL via INTRAVENOUS

## 2020-12-13 ENCOUNTER — Telehealth: Payer: Self-pay | Admitting: Internal Medicine

## 2020-12-13 DIAGNOSIS — I671 Cerebral aneurysm, nonruptured: Secondary | ICD-10-CM

## 2020-12-13 DIAGNOSIS — I77810 Thoracic aortic ectasia: Secondary | ICD-10-CM

## 2020-12-13 NOTE — Telephone Encounter (Signed)
Pt updated with 4/12 imaging results. All questions answered.

## 2020-12-13 NOTE — Assessment & Plan Note (Signed)
4/12 CTA head shows stable size. Pt called with results.

## 2020-12-13 NOTE — Assessment & Plan Note (Addendum)
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 Pt called with results.  -will need annual surveillance monitoring

## 2020-12-25 DIAGNOSIS — H52203 Unspecified astigmatism, bilateral: Secondary | ICD-10-CM | POA: Diagnosis not present

## 2020-12-25 DIAGNOSIS — H2513 Age-related nuclear cataract, bilateral: Secondary | ICD-10-CM | POA: Diagnosis not present

## 2020-12-25 DIAGNOSIS — H524 Presbyopia: Secondary | ICD-10-CM | POA: Diagnosis not present

## 2020-12-25 DIAGNOSIS — H5213 Myopia, bilateral: Secondary | ICD-10-CM | POA: Diagnosis not present

## 2020-12-25 DIAGNOSIS — H25013 Cortical age-related cataract, bilateral: Secondary | ICD-10-CM | POA: Diagnosis not present

## 2020-12-27 ENCOUNTER — Other Ambulatory Visit (HOSPITAL_COMMUNITY): Payer: Self-pay

## 2020-12-27 MED FILL — Bictegravir-Emtricitabine-Tenofovir AF Tab 50-200-25 MG: ORAL | 30 days supply | Qty: 30 | Fill #1 | Status: AC

## 2020-12-29 ENCOUNTER — Other Ambulatory Visit (HOSPITAL_COMMUNITY): Payer: Self-pay

## 2021-01-04 DIAGNOSIS — H52209 Unspecified astigmatism, unspecified eye: Secondary | ICD-10-CM | POA: Diagnosis not present

## 2021-01-04 DIAGNOSIS — H524 Presbyopia: Secondary | ICD-10-CM | POA: Diagnosis not present

## 2021-01-04 DIAGNOSIS — H5213 Myopia, bilateral: Secondary | ICD-10-CM | POA: Diagnosis not present

## 2021-01-25 ENCOUNTER — Other Ambulatory Visit (HOSPITAL_COMMUNITY): Payer: Self-pay

## 2021-01-25 MED FILL — Bictegravir-Emtricitabine-Tenofovir AF Tab 50-200-25 MG: ORAL | 30 days supply | Qty: 30 | Fill #2 | Status: AC

## 2021-02-22 ENCOUNTER — Other Ambulatory Visit (HOSPITAL_COMMUNITY): Payer: Self-pay

## 2021-02-26 ENCOUNTER — Other Ambulatory Visit (HOSPITAL_COMMUNITY): Payer: Self-pay

## 2021-02-26 ENCOUNTER — Encounter: Payer: TRICARE For Life (TFL) | Admitting: Internal Medicine

## 2021-02-26 MED FILL — Bictegravir-Emtricitabine-Tenofovir AF Tab 50-200-25 MG: ORAL | 30 days supply | Qty: 30 | Fill #3 | Status: AC

## 2021-03-01 ENCOUNTER — Other Ambulatory Visit: Payer: Self-pay | Admitting: Internal Medicine

## 2021-03-01 DIAGNOSIS — I10 Essential (primary) hypertension: Secondary | ICD-10-CM

## 2021-03-01 DIAGNOSIS — E7849 Other hyperlipidemia: Secondary | ICD-10-CM

## 2021-03-02 ENCOUNTER — Other Ambulatory Visit: Payer: Self-pay | Admitting: Internal Medicine

## 2021-03-02 DIAGNOSIS — E7849 Other hyperlipidemia: Secondary | ICD-10-CM

## 2021-03-02 DIAGNOSIS — I10 Essential (primary) hypertension: Secondary | ICD-10-CM

## 2021-03-06 ENCOUNTER — Encounter: Payer: Self-pay | Admitting: *Deleted

## 2021-03-07 ENCOUNTER — Other Ambulatory Visit: Payer: Self-pay

## 2021-03-07 ENCOUNTER — Ambulatory Visit (INDEPENDENT_AMBULATORY_CARE_PROVIDER_SITE_OTHER): Payer: Medicare HMO | Admitting: Internal Medicine

## 2021-03-07 ENCOUNTER — Encounter: Payer: Self-pay | Admitting: Internal Medicine

## 2021-03-07 VITALS — BP 119/77 | HR 72 | Temp 98.1°F | Resp 24 | Ht 69.0 in | Wt 162.0 lb

## 2021-03-07 DIAGNOSIS — E785 Hyperlipidemia, unspecified: Secondary | ICD-10-CM

## 2021-03-07 DIAGNOSIS — I10 Essential (primary) hypertension: Secondary | ICD-10-CM

## 2021-03-07 DIAGNOSIS — Z1211 Encounter for screening for malignant neoplasm of colon: Secondary | ICD-10-CM | POA: Diagnosis not present

## 2021-03-07 DIAGNOSIS — E7849 Other hyperlipidemia: Secondary | ICD-10-CM

## 2021-03-07 MED ORDER — AMLODIPINE BESYLATE 5 MG PO TABS: 5.0000 mg | ORAL_TABLET | Freq: Every day | ORAL | 1 refills | Status: DC

## 2021-03-07 MED ORDER — LISINOPRIL-HYDROCHLOROTHIAZIDE 20-12.5 MG PO TABS: 2.0000 | ORAL_TABLET | Freq: Every day | ORAL | 1 refills | Status: DC

## 2021-03-07 MED ORDER — ATORVASTATIN CALCIUM 40 MG PO TABS: ORAL_TABLET | ORAL | 1 refills | Status: DC

## 2021-03-07 NOTE — Patient Instructions (Addendum)
It was nice seeing you today! Thank you for choosing [facility] for your Primary Care.    Today we talked about:   - You have refills at the pharmacy available.  - Stool testing to screen for colon cancer.

## 2021-03-07 NOTE — Assessment & Plan Note (Signed)
BP: 119/77   Blood pressure within goal at this time.  Patient denies any concerns with his medication and is requesting refills today.  Assessment/plan: - Refilled amlodipine 5 mg daily and lisinopril-HCTZ 20-12.5 mg, 2 tablets daily -30-month follow-up with primary care provider

## 2021-03-07 NOTE — Assessment & Plan Note (Signed)
Thomas [name] states he is unwilling at this time to have a colonoscopy done but is willing to do the Cologuard or FIT testing.  He states that if screening test turn out to be positive, he is willing to rediscuss the colonoscopy.  -Cologuard ordered

## 2021-03-07 NOTE — Assessment & Plan Note (Signed)
LDL has historically been within goal, but not been checked since 2018.  Will defer to next office visit at which time patient will be having lab work anyway.    -Atorvastatin 40 mg daily refilled

## 2021-03-07 NOTE — Progress Notes (Signed)
   CC: HTN, colon cancer screening  HPI:  [name] is a 75 y.o. with a PMHx as listed below who presents to the clinic for HTN, colon cancer screening.   Please see the Encounters tab for problem-based Assessment & Plan regarding status of patient's acute and chronic conditions.  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  Recommend 1 year follow up with CT or MRI.    Enlarged thyroid 04/22/2015   Seen on CTA 04/21/15: Contains multiple nodules measuring up to 2.1 cm in size, TSH wnl.  Needs thyroid ultrasound and biopsy   Heart murmur    HIV (human immunodeficiency virus infection) (HCC)    HTN (hypertension)    Immune deficiency disorder (HCC)    Stroke (HCC)    Review of Systems: Review of Systems  Respiratory:  Negative for cough, shortness of breath and wheezing.   Cardiovascular:  Negative for chest pain, claudication and leg swelling.  Gastrointestinal:  Negative for abdominal pain, diarrhea, nausea and vomiting.   Physical Exam:  Vitals:   03/07/21 0935  BP: 119/77  Pulse: 72  Resp: (!) 24  Temp: 98.1 F (36.7 C)  TempSrc: Oral  SpO2: 97%  Weight: 162 lb (73.5 kg)  Height: 5\' 9"  (1.753 m)   Physical Exam Vitals and nursing note reviewed.  Constitutional:      General: He is not in acute distress.    Appearance: He is normal weight.  Cardiovascular:     Rate and Rhythm: Normal rate and regular rhythm.     Heart sounds: No murmur heard.   No gallop.  Pulmonary:     Effort: Pulmonary effort is normal. No respiratory distress.     Breath sounds: Normal breath sounds. No wheezing, rhonchi or rales.  Abdominal:     General: Bowel sounds are normal. There is no distension.     Palpations: Abdomen is soft.     Tenderness:  There is no abdominal tenderness. There is no guarding.  Skin:    General: Skin is warm and dry.  Neurological:     General: No focal deficit present.     Mental Status: He is alert and oriented to person, place, and time. Mental status is at baseline.  Psychiatric:        Mood and Affect: Mood normal.        Behavior: Behavior normal.    Assessment & Plan:   See Encounters Tab for problem based charting.  Patient discussed with Dr. 

## 2021-03-09 NOTE — Progress Notes (Signed)
Internal Medicine Clinic Attending  Case discussed with Dr. Basaraba  At the time of the visit.  We reviewed the resident's history and exam and pertinent patient test results.  I agree with the assessment, diagnosis, and plan of care documented in the resident's note.  

## 2021-03-21 ENCOUNTER — Other Ambulatory Visit (HOSPITAL_COMMUNITY): Payer: Self-pay

## 2021-03-21 ENCOUNTER — Other Ambulatory Visit: Payer: Self-pay | Admitting: Internal Medicine

## 2021-03-21 DIAGNOSIS — B20 Human immunodeficiency virus [HIV] disease: Secondary | ICD-10-CM

## 2021-03-21 MED ORDER — BIKTARVY 50-200-25 MG PO TABS
1.0000 | ORAL_TABLET | Freq: Every day | ORAL | 1 refills | Status: DC
  Filled 2021-03-21 – 2021-03-26 (×2): qty 30, 30d supply, fill #0
  Filled 2021-04-23: qty 30, 30d supply, fill #1

## 2021-03-23 ENCOUNTER — Other Ambulatory Visit: Payer: Medicare HMO

## 2021-03-23 ENCOUNTER — Other Ambulatory Visit: Payer: Self-pay

## 2021-03-23 ENCOUNTER — Other Ambulatory Visit (HOSPITAL_COMMUNITY)
Admission: RE | Admit: 2021-03-23 | Discharge: 2021-03-23 | Disposition: A | Discharge status: Home / Self Care | Payer: Medicare HMO | Source: Ambulatory Visit | Attending: Internal Medicine | Admitting: Internal Medicine

## 2021-03-23 DIAGNOSIS — Z79899 Other long term (current) drug therapy: Secondary | ICD-10-CM | POA: Diagnosis not present

## 2021-03-23 DIAGNOSIS — Z113 Encounter for screening for infections with a predominantly sexual mode of transmission: Secondary | ICD-10-CM | POA: Diagnosis not present

## 2021-03-23 DIAGNOSIS — B20 Human immunodeficiency virus [HIV] disease: Secondary | ICD-10-CM

## 2021-03-26 ENCOUNTER — Other Ambulatory Visit (HOSPITAL_COMMUNITY): Payer: Self-pay

## 2021-03-26 LAB — URINE CYTOLOGY ANCILLARY ONLY
Chlamydia: NEGATIVE
Comment: NEGATIVE
Comment: NORMAL
Neisseria Gonorrhea: NEGATIVE

## 2021-03-27 LAB — T-HELPER CELLS (CD4) COUNT (NOT AT ARMC)
Absolute CD4: 440 cells/uL — ABNORMAL LOW (ref 490–1740)
CD4 T Helper %: 16 % — ABNORMAL LOW (ref 30–61)
Total lymphocyte count: 2692 cells/uL (ref 850–3900)

## 2021-03-27 LAB — LIPID PANEL
Cholesterol: 85 mg/dL (ref <200)
HDL: 40 mg/dL (ref >=40)
LDL Cholesterol (Calc): 31 mg/dL (calc)
Non-HDL Cholesterol (Calc): 45 mg/dL (calc) (ref <130)
Total CHOL/HDL Ratio: 2.1 (calc) (ref <5.0)
Triglycerides: 48 mg/dL (ref <150)

## 2021-03-27 LAB — COMPLETE METABOLIC PANEL WITH GFR
AG Ratio: 1.4 (calc) (ref 1.0–2.5)
ALT: 13 U/L (ref 9–46)
AST: 17 U/L (ref 10–35)
Albumin: 3.9 g/dL (ref 3.6–5.1)
Alkaline phosphatase (APISO): 94 U/L (ref 35–144)
BUN/Creatinine Ratio: 12 (calc) (ref 6–22)
BUN: 17 mg/dL (ref 7–25)
CO2: 32 mmol/L (ref 20–32)
Calcium: 9.1 mg/dL (ref 8.6–10.3)
Chloride: 106 mmol/L (ref 98–110)
Creat: 1.42 mg/dL — ABNORMAL HIGH (ref 0.70–1.28)
Globulin: 2.8 g/dL (calc) (ref 1.9–3.7)
Glucose, Bld: 103 mg/dL — ABNORMAL HIGH (ref 65–99)
Potassium: 4 mmol/L (ref 3.5–5.3)
Sodium: 142 mmol/L (ref 135–146)
Total Bilirubin: 0.5 mg/dL (ref 0.2–1.2)
Total Protein: 6.7 g/dL (ref 6.1–8.1)
eGFR: 52 mL/min/{1.73_m2} — ABNORMAL LOW (ref >=60)

## 2021-03-27 LAB — HIV-1 RNA QUANT-NO REFLEX-BLD
HIV 1 RNA Quant: NOT DETECTED Copies/mL
HIV-1 RNA Quant, Log: NOT DETECTED Log cps/mL

## 2021-03-27 LAB — CBC WITH DIFFERENTIAL/PLATELET
Absolute Monocytes: 335 cells/uL (ref 200–950)
Basophils Absolute: 10 cells/uL (ref 0–200)
Basophils Relative: 0.2 %
Eosinophils Absolute: 40 cells/uL (ref 15–500)
Eosinophils Relative: 0.8 %
HCT: 42.9 % (ref 38.5–50.0)
Hemoglobin: 14.4 g/dL (ref 13.2–17.1)
Lymphs Abs: 2535 cells/uL (ref 850–3900)
MCH: 29.2 pg (ref 27.0–33.0)
MCHC: 33.6 g/dL (ref 32.0–36.0)
MCV: 87 fL (ref 80.0–100.0)
MPV: 11.2 fL (ref 7.5–12.5)
Monocytes Relative: 6.7 %
Neutro Abs: 2080 cells/uL (ref 1500–7800)
Neutrophils Relative %: 41.6 %
Platelets: 131 10*3/uL — ABNORMAL LOW (ref 140–400)
RBC: 4.93 10*6/uL (ref 4.20–5.80)
RDW: 13.2 % (ref 11.0–15.0)
Total Lymphocyte: 50.7 %
WBC: 5 10*3/uL (ref 3.8–10.8)

## 2021-03-27 LAB — RPR: RPR Ser Ql: NONREACTIVE

## 2021-04-10 ENCOUNTER — Other Ambulatory Visit: Payer: Self-pay

## 2021-04-10 ENCOUNTER — Encounter: Payer: Self-pay | Admitting: Internal Medicine

## 2021-04-10 ENCOUNTER — Ambulatory Visit (INDEPENDENT_AMBULATORY_CARE_PROVIDER_SITE_OTHER): Payer: Medicare HMO | Admitting: Internal Medicine

## 2021-04-10 VITALS — BP 111/72 | HR 69 | Wt 160.0 lb

## 2021-04-10 DIAGNOSIS — B20 Human immunodeficiency virus [HIV] disease: Secondary | ICD-10-CM

## 2021-04-10 DIAGNOSIS — F4321 Adjustment disorder with depressed mood: Secondary | ICD-10-CM
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
## 2021-04-11 ENCOUNTER — Encounter: Payer: Self-pay | Admitting: Internal Medicine

## 2021-04-11 DIAGNOSIS — F4320 Adjustment disorder, unspecified: Secondary | ICD-10-CM | POA: Insufficient documentation

## 2021-04-11 DIAGNOSIS — F4321 Adjustment disorder with depressed mood: Secondary | ICD-10-CM | POA: Insufficient documentation

## 2021-04-11 NOTE — Assessment & Plan Note (Addendum)
This is a new issue.  Grieving and discussed counseling and he is interested.  Will get him established.

## 2021-04-11 NOTE — Assessment & Plan Note (Signed)
He is doing well and no concerns.  Tolerating the medication.  No changes and rtc in 6 months.

## 2021-04-17 ENCOUNTER — Ambulatory Visit: Payer: Medicare HMO

## 2021-04-17 ENCOUNTER — Other Ambulatory Visit: Payer: Self-pay

## 2021-04-18 ENCOUNTER — Other Ambulatory Visit (HOSPITAL_COMMUNITY): Payer: Self-pay

## 2021-04-23 ENCOUNTER — Other Ambulatory Visit (HOSPITAL_COMMUNITY): Payer: Self-pay

## 2021-04-24 ENCOUNTER — Ambulatory Visit: Payer: Medicare HMO

## 2021-04-24 ENCOUNTER — Other Ambulatory Visit: Payer: Self-pay

## 2021-05-01 ENCOUNTER — Ambulatory Visit: Payer: Medicare HMO

## 2021-05-10 ENCOUNTER — Other Ambulatory Visit: Payer: Self-pay

## 2021-05-10 ENCOUNTER — Ambulatory Visit: Payer: Medicare HMO

## 2021-05-15 ENCOUNTER — Other Ambulatory Visit: Payer: Self-pay

## 2021-05-15 ENCOUNTER — Ambulatory Visit: Payer: Medicare HMO

## 2021-05-18 ENCOUNTER — Other Ambulatory Visit (HOSPITAL_COMMUNITY): Payer: Self-pay

## 2021-05-18 ENCOUNTER — Other Ambulatory Visit: Payer: Self-pay | Admitting: Internal Medicine

## 2021-05-18 DIAGNOSIS — B20 Human immunodeficiency virus [HIV] disease: Secondary | ICD-10-CM

## 2021-05-18 MED ORDER — BIKTARVY 50-200-25 MG PO TABS
1.0000 | ORAL_TABLET | Freq: Every day | ORAL | 1 refills | Status: DC
  Filled 2021-05-18: qty 30, 30d supply, fill #0
  Filled 2021-06-18: qty 30, 30d supply, fill #1

## 2021-05-21 ENCOUNTER — Other Ambulatory Visit (HOSPITAL_COMMUNITY): Payer: Self-pay

## 2021-05-22 ENCOUNTER — Other Ambulatory Visit: Payer: Self-pay

## 2021-05-22 ENCOUNTER — Ambulatory Visit: Payer: Medicare HMO

## 2021-05-27 DIAGNOSIS — Z1211 Encounter for screening for malignant neoplasm of colon: Secondary | ICD-10-CM | POA: Diagnosis not present

## 2021-05-27 DIAGNOSIS — Z1212 Encounter for screening for malignant neoplasm of rectum: Secondary | ICD-10-CM | POA: Diagnosis not present

## 2021-05-29 ENCOUNTER — Other Ambulatory Visit: Payer: Self-pay

## 2021-05-29 ENCOUNTER — Ambulatory Visit: Payer: Medicare HMO

## 2021-06-01 LAB — COLOGUARD: COLOGUARD: NEGATIVE

## 2021-06-05 ENCOUNTER — Ambulatory Visit: Payer: Medicare HMO

## 2021-06-05 ENCOUNTER — Other Ambulatory Visit: Payer: Self-pay

## 2021-06-11 LAB — COLOGUARD

## 2021-06-12 ENCOUNTER — Telehealth: Payer: Self-pay | Admitting: Internal Medicine

## 2021-06-12 ENCOUNTER — Ambulatory Visit: Payer: Medicare HMO

## 2021-06-12 DIAGNOSIS — Z0000 Encounter for general adult medical examination without abnormal findings: Secondary | ICD-10-CM | POA: Insufficient documentation

## 2021-06-12 DIAGNOSIS — I77810 Thoracic aortic ectasia: Secondary | ICD-10-CM

## 2021-06-12 DIAGNOSIS — I671 Cerebral aneurysm, nonruptured: Secondary | ICD-10-CM

## 2021-06-12 LAB — COLOGUARD: Cologuard: NEGATIVE

## 2021-06-12 NOTE — Telephone Encounter (Deleted)
Pt updated with normal cologuard results. He will plan to follow up with me in January for his chronic medical conditions.

## 2021-06-12 NOTE — Assessment & Plan Note (Signed)
Pt updated with normal cologuard results. Next cologuard due in 2025  He will plan to follow up with me in January for his chronic medical conditions.

## 2021-06-12 NOTE — Telephone Encounter (Signed)
Cologuard results relayed to patient.

## 2021-06-18 ENCOUNTER — Other Ambulatory Visit (HOSPITAL_COMMUNITY): Payer: Self-pay

## 2021-06-21 ENCOUNTER — Other Ambulatory Visit (HOSPITAL_COMMUNITY): Payer: Self-pay

## 2021-07-18 ENCOUNTER — Other Ambulatory Visit: Payer: Self-pay | Admitting: Internal Medicine

## 2021-07-18 ENCOUNTER — Other Ambulatory Visit (HOSPITAL_COMMUNITY): Payer: Self-pay

## 2021-07-18 DIAGNOSIS — B20 Human immunodeficiency virus [HIV] disease: Secondary | ICD-10-CM

## 2021-07-18 MED ORDER — BIKTARVY 50-200-25 MG PO TABS
1.0000 | ORAL_TABLET | Freq: Every day | ORAL | 2 refills | Status: DC
  Filled 2021-07-18: qty 30, 30d supply, fill #0
  Filled 2021-08-15: qty 30, 30d supply, fill #1
  Filled 2021-09-12: qty 30, 30d supply, fill #2

## 2021-07-20 ENCOUNTER — Other Ambulatory Visit (HOSPITAL_COMMUNITY): Payer: Self-pay

## 2021-08-15 ENCOUNTER — Other Ambulatory Visit (HOSPITAL_COMMUNITY): Payer: Self-pay

## 2021-08-21 ENCOUNTER — Other Ambulatory Visit (HOSPITAL_COMMUNITY): Payer: Self-pay

## 2021-09-12 ENCOUNTER — Other Ambulatory Visit (HOSPITAL_COMMUNITY): Payer: Self-pay

## 2021-09-13 ENCOUNTER — Other Ambulatory Visit (HOSPITAL_COMMUNITY): Payer: Self-pay

## 2021-09-18 ENCOUNTER — Other Ambulatory Visit (HOSPITAL_COMMUNITY): Payer: Self-pay

## 2021-09-27 ENCOUNTER — Encounter: Payer: Self-pay | Admitting: Internal Medicine

## 2021-09-27 ENCOUNTER — Ambulatory Visit (INDEPENDENT_AMBULATORY_CARE_PROVIDER_SITE_OTHER): Payer: Medicare HMO | Admitting: Internal Medicine

## 2021-09-27 VITALS — BP 138/80 | HR 70 | Temp 98.2°F | Wt 172.6 lb

## 2021-09-27 DIAGNOSIS — I10 Essential (primary) hypertension: Secondary | ICD-10-CM | POA: Diagnosis not present

## 2021-09-27 DIAGNOSIS — F4321 Adjustment disorder with depressed mood: Secondary | ICD-10-CM

## 2021-09-27 DIAGNOSIS — I77810 Thoracic aortic ectasia: Secondary | ICD-10-CM

## 2021-09-27 DIAGNOSIS — N1831 Chronic kidney disease, stage 3a: Secondary | ICD-10-CM | POA: Diagnosis not present

## 2021-09-27 DIAGNOSIS — I129 Hypertensive chronic kidney disease with stage 1 through stage 4 chronic kidney disease, or unspecified chronic kidney disease: Secondary | ICD-10-CM

## 2021-09-27 DIAGNOSIS — R002 Palpitations: Secondary | ICD-10-CM

## 2021-09-28 LAB — BASIC METABOLIC PANEL
BUN/Creatinine Ratio: 10 (ref 10–24)
BUN: 14 mg/dL (ref 8–27)
CO2: 25 mmol/L (ref 20–29)
Calcium: 9.2 mg/dL (ref 8.6–10.2)
Chloride: 103 mmol/L (ref 96–106)
Creatinine, Ser: 1.37 mg/dL — ABNORMAL HIGH (ref 0.76–1.27)
Glucose: 157 mg/dL — ABNORMAL HIGH (ref 70–99)
Potassium: 4.3 mmol/L (ref 3.5–5.2)
Sodium: 143 mmol/L (ref 134–144)
eGFR: 53 mL/min/{1.73_m2} — ABNORMAL LOW (ref >59)

## 2021-09-29 ENCOUNTER — Encounter: Payer: Self-pay | Admitting: Internal Medicine

## 2021-09-29 DIAGNOSIS — R002 Palpitations: Secondary | ICD-10-CM | POA: Insufficient documentation

## 2021-09-29 NOTE — Assessment & Plan Note (Signed)
He expresses sadness to me today, over the loss of his sister last August. He is coping well and slowly getting back into activities that he enjoys. PHQ-9 score is 0. He was referred for counseling by ID should he feel he wishes to pursue this route.

## 2021-09-29 NOTE — Progress Notes (Signed)
Office Visit   Patient ID: Thomas [name], male    DOB: 21-Oct-1945, 76 y.o.   MRN: [id number]   PCP: Elige Radon, MD   Subjective:   Thomas [name] is a 76 y.o. year old male with hypertension, hyperlipidemia, HIV, and history of CVA who presents for routine hypertension follow up.  LOV with me was in March 2022.  We had obtained a chest CTA for surveillance of thoracic aortic arch which showed stable size at 21mm.  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  Cologuard was ordered and returned negative.   He denies any issues with medications today and reports adherence to them. He has been doing generally well since our last visit.   He has been experiencing heart palpitations upon awakening in the morning, occurring ~4x per week. He notes that he checks his pulse during these episodes and that it is normal rate. Palpitations last anywhere from a few minutes up to 45 minutes and resolve once he gets up and starts moving around. Do not occur during the day. Not associated with night terrors. Denies associated sx including chest pain, light headedness, dizziness, or shortness of breath. No recent medication changes. No prior similar symptoms.    ACTIVE MEDICATIONS   Outpatient Medications Prior to Visit  Medication Sig   amLODipine (NORVASC) 5 MG tablet Take 1 tablet (5 mg total) by mouth daily.   aspirin EC 325 MG tablet Take 1 tablet (325 mg total) by mouth daily.   atorvastatin (LIPITOR) 40 MG tablet TAKE 1 TABLET BY MOUTH IN THE EVENING AT  6PM   bictegravir-emtricitabine-tenofovir AF (BIKTARVY) 50-200-25 MG TABS tablet TAKE 1 TABLET BY MOUTH DAILY.   lisinopril-hydrochlorothiazide (ZESTORETIC) 20-12.5 MG tablet Take 2 tablets by mouth daily.   No facility-administered medications prior to visit.     Objective:   BP 138/80 (BP Location: Left Arm, Cuff Size: Normal)    Pulse 70    Temp 98.2 F (36.8 C) (Oral)    Wt 172  lb 9.6 oz (78.3 kg)    SpO2 100%    BMI 25.49 kg/m  Wt Readings from Last 3 Encounters:  09/27/21 172 lb 9.6 oz (78.3 kg)  04/10/21 160 lb (72.6 kg)  03/07/21 162 lb (73.5 kg)    BP Readings from Last 3 Encounters:  09/27/21 138/80  04/10/21 111/72  03/07/21 119/77   [name]: well appearing, no distress Cardiac: RRR, no murmur, no LE edema Pulm: lungs clear throughout  Health Maintenance:   Assessment & Plan:   Problem List Items Addressed This Visit     Essential hypertension - Primary (Chronic)    Chronic and stable. Electrolytes/renal function stable on BMP today. 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      Grief reaction    He expresses sadness to me today, over the loss of his sister last August. He is coping well and slowly getting back into activities that he enjoys. PHQ-9 score is 0. He was referred for counseling by ID should he feel he wishes to pursue this route.      Palpitations    He has been experiencing heart palpitations upon awakening in the morning, occurring ~4x per week. He notes that he checks his pulse during these episodes and that it is normal  rate. Palpitations last anywhere from a few minutes up to 45 minutes and resolve once he gets up and starts moving around. Do not occur during the day. Not associated with night terrors. Denies associated sx including chest pain, light headedness, dizziness, or shortness of breath. No recent medication changes. No prior similar symptoms.  Will place him on a 7d holter monitor which should suffice to capture at least a few of these episodes. He will keep a diary of the dates and times that symptoms occurred to help correlate with the read       Ectatic thoracic aorta (HCC)    Routine annual surveillence due in April. CTA chest ordered.       Return in  about 6 months (around 03/27/2022) or after completion of holter monitoring, depending on results. -Check lipid panel at [facility]  Pt discussed with Dr. Rejeana Brock, MD Internal Medicine Resident PGY-3 [facility] 09/29/2021 7:44 PM

## 2021-09-29 NOTE — Assessment & Plan Note (Signed)
Chronic and stable. Electrolytes/renal function stable on BMP today.  Continue current management with amlodipine 5mg  daily, lisinopril-hctz 20-12.5mg  daily

## 2021-09-29 NOTE — Assessment & Plan Note (Signed)
Chronic and stable on BMP today.  ?

## 2021-09-29 NOTE — Assessment & Plan Note (Signed)
He has been experiencing heart palpitations upon awakening in the morning, occurring ~4x per week. He notes that he checks his pulse during these episodes and that it is normal rate. Palpitations last anywhere from a few minutes up to 45 minutes and resolve once he gets up and starts moving around. Do not occur during the day. Not associated with night terrors. Denies associated sx including chest pain, light headedness, dizziness, or shortness of breath. No recent medication changes. No prior similar symptoms.   Will place him on a 7d holter monitor which should suffice to capture at least a few of these episodes. He will keep a diary of the dates and times that symptoms occurred to help correlate with the read

## 2021-10-01 ENCOUNTER — Ambulatory Visit (INDEPENDENT_AMBULATORY_CARE_PROVIDER_SITE_OTHER): Payer: Medicare HMO

## 2021-10-01 ENCOUNTER — Other Ambulatory Visit: Payer: Self-pay | Admitting: Internal Medicine

## 2021-10-01 ENCOUNTER — Other Ambulatory Visit: Payer: Self-pay | Admitting: Student in an Organized Health Care Education/Training Program

## 2021-10-01 DIAGNOSIS — R002 Palpitations: Secondary | ICD-10-CM

## 2021-10-01 NOTE — Progress Notes (Unsigned)
Enrolled for [employer] to mail a ZIO XT long term holter monitor to the patients address on file.  

## 2021-10-02 NOTE — Progress Notes (Signed)
Internal Medicine Clinic Attending  Case discussed with Dr. Christian  At the time of the visit.  We reviewed the resident's history and exam and pertinent patient test results.  I agree with the assessment, diagnosis, and plan of care documented in the resident's note.  

## 2021-10-04 DIAGNOSIS — R002 Palpitations: Secondary | ICD-10-CM | POA: Diagnosis not present

## 2021-10-04 DIAGNOSIS — Z79899 Other long term (current) drug therapy: Secondary | ICD-10-CM | POA: Diagnosis not present

## 2021-10-04 DIAGNOSIS — Z7982 Long term (current) use of aspirin: Secondary | ICD-10-CM | POA: Diagnosis not present

## 2021-10-04 DIAGNOSIS — I10 Essential (primary) hypertension: Secondary | ICD-10-CM | POA: Diagnosis not present

## 2021-10-04 DIAGNOSIS — R079 Chest pain, unspecified: Secondary | ICD-10-CM | POA: Diagnosis not present

## 2021-10-04 DIAGNOSIS — R0789 Other chest pain: Secondary | ICD-10-CM | POA: Diagnosis not present

## 2021-10-04 DIAGNOSIS — R072 Precordial pain: Secondary | ICD-10-CM | POA: Diagnosis not present

## 2021-10-04 DIAGNOSIS — Z21 Asymptomatic human immunodeficiency virus [HIV] infection status: Secondary | ICD-10-CM | POA: Diagnosis not present

## 2021-10-08 ENCOUNTER — Other Ambulatory Visit (HOSPITAL_COMMUNITY): Payer: Self-pay

## 2021-10-10 ENCOUNTER — Other Ambulatory Visit: Payer: Self-pay | Admitting: Internal Medicine

## 2021-10-10 ENCOUNTER — Other Ambulatory Visit (HOSPITAL_COMMUNITY): Payer: Self-pay

## 2021-10-10 DIAGNOSIS — B20 Human immunodeficiency virus [HIV] disease: Secondary | ICD-10-CM

## 2021-10-10 NOTE — Telephone Encounter (Signed)
Patient has appointment on 2/15 °

## 2021-10-16 DIAGNOSIS — R002 Palpitations: Secondary | ICD-10-CM | POA: Diagnosis not present

## 2021-10-17 ENCOUNTER — Encounter: Payer: Self-pay | Admitting: Internal Medicine

## 2021-10-17 ENCOUNTER — Ambulatory Visit (INDEPENDENT_AMBULATORY_CARE_PROVIDER_SITE_OTHER): Payer: Medicare HMO | Admitting: Internal Medicine

## 2021-10-17 ENCOUNTER — Other Ambulatory Visit: Payer: Self-pay

## 2021-10-17 ENCOUNTER — Ambulatory Visit: Payer: Medicare HMO

## 2021-10-17 VITALS — BP 129/77 | HR 82 | Resp 16 | Ht 69.0 in | Wt 169.0 lb

## 2021-10-17 DIAGNOSIS — F4321 Adjustment disorder with depressed mood: Secondary | ICD-10-CM

## 2021-10-17 DIAGNOSIS — B20 Human immunodeficiency virus [HIV] disease: Secondary | ICD-10-CM | POA: Diagnosis not present

## 2021-10-17 NOTE — Assessment & Plan Note (Signed)
He continues to do well, no changes indicated. Labs today and rtc in 6 months.

## 2021-10-17 NOTE — Assessment & Plan Note (Signed)
He continues to grieve but has a good plan to get back to work and give himself more purpose.

## 2021-10-17 NOTE — Progress Notes (Signed)
° °  Subjective:    Patient ID: Thomas [name], male    DOB: 1946-03-03, 76 y.o.   MRN: [id number]  HPI Here for follow up of HIV He continues on Biktarvy and denies any missed doses.  He has had no issues with getting, taking or tolerating his medication.  He is still grieving from the loss of his sister earlier last year.  He has been home a lot and now is going to go back to work to give himself more to do and get around people.     Review of Systems  Constitutional:  Negative for fatigue.  Gastrointestinal:  Negative for diarrhea and nausea.  Skin:  Negative for rash.      Objective:   Physical Exam Eyes:     General: No scleral icterus. Pulmonary:     Effort: Pulmonary effort is normal.  Neurological:     General: No focal deficit present.     Mental Status: He is alert.  Psychiatric:        Mood and Affect: Mood normal.          Assessment & Plan:

## 2021-10-18 ENCOUNTER — Other Ambulatory Visit (HOSPITAL_COMMUNITY): Payer: Self-pay

## 2021-10-18 MED ORDER — BIKTARVY 50-200-25 MG PO TABS
1.0000 | ORAL_TABLET | Freq: Every day | ORAL | 7 refills | Status: DC
  Filled 2021-10-18: qty 30, 30d supply, fill #0
  Filled 2021-11-12: qty 30, 30d supply, fill #1
  Filled 2021-12-10: qty 30, 30d supply, fill #2
  Filled 2022-01-02: qty 30, 30d supply, fill #3
  Filled 2022-02-05: qty 30, 30d supply, fill #4
  Filled 2022-02-28: qty 30, 30d supply, fill #5
  Filled 2022-04-02: qty 30, 30d supply, fill #6
  Filled 2022-04-26: qty 30, 30d supply, fill #7

## 2021-10-19 ENCOUNTER — Other Ambulatory Visit (HOSPITAL_COMMUNITY): Payer: Self-pay

## 2021-10-19 LAB — T-HELPER CELLS (CD4) COUNT (NOT AT ARMC)
Absolute CD4: 342 cells/uL — ABNORMAL LOW (ref 490–1740)
CD4 T Helper %: 20 % — ABNORMAL LOW (ref 30–61)
Total lymphocyte count: 1689 cells/uL (ref 850–3900)

## 2021-10-19 LAB — HIV-1 RNA QUANT-NO REFLEX-BLD
HIV 1 RNA Quant: NOT DETECTED Copies/mL
HIV-1 RNA Quant, Log: NOT DETECTED Log cps/mL

## 2021-10-23 ENCOUNTER — Other Ambulatory Visit (HOSPITAL_COMMUNITY): Payer: Self-pay

## 2021-10-23 ENCOUNTER — Telehealth: Payer: Self-pay

## 2021-10-23 NOTE — Telephone Encounter (Signed)
[facility] Patient Advocate Encounter   I was successful in securing patient a $7500.00 grant from [employer] ([employer]) to provide copayment coverage for Biktarvy.  This will make the out of pocket cost $0.00.     I have spoken with the patient.    The billing information is as follows and has been shared with [facility].       Dates of Eligibility: 10/24/21 through 10/24/22  Patient knows to call the office with questions or concerns.  Clearance Coots, CPhT Specialty Pharmacy Patient [facility] Phone: [phone] Fax:  [phone]

## 2021-11-12 ENCOUNTER — Other Ambulatory Visit (HOSPITAL_COMMUNITY): Payer: Self-pay

## 2021-11-15 ENCOUNTER — Other Ambulatory Visit (HOSPITAL_COMMUNITY): Payer: Self-pay

## 2021-11-20 ENCOUNTER — Ambulatory Visit (HOSPITAL_COMMUNITY): Payer: Medicare HMO

## 2021-11-27 ENCOUNTER — Other Ambulatory Visit: Payer: Self-pay

## 2021-11-27 ENCOUNTER — Ambulatory Visit (INDEPENDENT_AMBULATORY_CARE_PROVIDER_SITE_OTHER): Payer: Medicare HMO | Admitting: Internal Medicine

## 2021-11-27 DIAGNOSIS — Z8673 Personal history of transient ischemic attack (TIA), and cerebral infarction without residual deficits: Secondary | ICD-10-CM

## 2021-11-27 NOTE — Progress Notes (Addendum)
Patient is here today requesting a letter to [facility] stating that he has a dx of CVA and PCA aneurysm. He requests that this is filled out and sent to the organization. Letter provided for patient, explained that he can give this physical copy to [facility]. Pt in agreement with plan. ?

## 2021-11-28 NOTE — Progress Notes (Signed)
Internal Medicine Clinic Attending ° °Case discussed with Dr. Bonanno  °  At the time of the visit.  We reviewed the resident’s history and exam and pertinent patient test results.  I agree with the assessment, diagnosis, and plan of care documented in the resident’s note. ° °

## 2021-11-29 ENCOUNTER — Other Ambulatory Visit: Payer: Self-pay | Admitting: Internal Medicine

## 2021-11-29 DIAGNOSIS — I10 Essential (primary) hypertension: Secondary | ICD-10-CM

## 2021-12-04 ENCOUNTER — Other Ambulatory Visit: Payer: Self-pay

## 2021-12-04 DIAGNOSIS — E7849 Other hyperlipidemia: Secondary | ICD-10-CM

## 2021-12-05 MED ORDER — ATORVASTATIN CALCIUM 40 MG PO TABS: ORAL_TABLET | ORAL | 1 refills | Status: DC

## 2021-12-05 MED ORDER — LISINOPRIL-HYDROCHLOROTHIAZIDE 20-12.5 MG PO TABS: 2.0000 | ORAL_TABLET | Freq: Every day | ORAL | 1 refills | Status: DC

## 2021-12-06 ENCOUNTER — Other Ambulatory Visit (HOSPITAL_COMMUNITY): Payer: Self-pay

## 2021-12-10 ENCOUNTER — Other Ambulatory Visit (HOSPITAL_COMMUNITY): Payer: Self-pay

## 2021-12-13 ENCOUNTER — Other Ambulatory Visit (HOSPITAL_COMMUNITY): Payer: Self-pay

## 2021-12-25 ENCOUNTER — Ambulatory Visit (INDEPENDENT_AMBULATORY_CARE_PROVIDER_SITE_OTHER): Payer: Medicare HMO | Admitting: Internal Medicine

## 2021-12-25 DIAGNOSIS — H524 Presbyopia: Secondary | ICD-10-CM | POA: Diagnosis not present

## 2021-12-25 DIAGNOSIS — Z0000 Encounter for general adult medical examination without abnormal findings: Secondary | ICD-10-CM | POA: Diagnosis not present

## 2021-12-25 DIAGNOSIS — H25813 Combined forms of age-related cataract, bilateral: Secondary | ICD-10-CM | POA: Diagnosis not present

## 2021-12-25 DIAGNOSIS — H52203 Unspecified astigmatism, bilateral: Secondary | ICD-10-CM | POA: Diagnosis not present

## 2021-12-25 DIAGNOSIS — H5213 Myopia, bilateral: Secondary | ICD-10-CM | POA: Diagnosis not present

## 2021-12-25 NOTE — Progress Notes (Signed)
Subjective:   Thomas [name] is a 76 y.o. male who presents for Medicare Annual/Subsequent preventive examination. I connected with  Thomas [name] on 12/25/21 by a audio enabled telemedicine application and verified that I am speaking with the correct person using two identifiers.  Patient Location: Home  Provider Location: Office/Clinic  I discussed the limitations of evaluation and management by telemedicine. The patient expressed understanding and agreed to proceed.  Review of Systems   DEFER to PCP .       Objective:    There were no vitals filed for this visit. There is no height or weight on file to calculate BMI.     09/27/2021    3:49 PM 03/07/2021    9:40 AM 11/23/2020    3:38 PM 11/14/2020   10:12 AM 02/24/2020   10:56 AM 02/17/2020    4:34 PM 06/22/2019    3:06 PM  Advanced Directives  Does Patient Have a Medical Advance Directive? No No No No No No No  Would patient like information on creating a medical advance directive? No - Patient declined No - Patient declined Yes (MAU/Ambulatory/Procedural Areas - Information given) Yes (MAU/Ambulatory/Procedural Areas - Information given) Yes (MAU/Ambulatory/Procedural Areas - Information given) No - Patient declined Yes (MAU/Ambulatory/Procedural Areas - Information given)    Current Medications (verified) Outpatient Encounter Medications as of 12/25/2021  Medication Sig   amLODipine (NORVASC) 5 MG tablet Take 1 tablet by mouth once daily   aspirin EC 325 MG tablet Take 1 tablet (325 mg total) by mouth daily.   atorvastatin (LIPITOR) 40 MG tablet TAKE 1 TABLET BY MOUTH IN THE EVENING AT  6PM   bictegravir-emtricitabine-tenofovir AF (BIKTARVY) 50-200-25 MG TABS tablet TAKE 1 TABLET BY MOUTH DAILY.   lisinopril-hydrochlorothiazide (ZESTORETIC) 20-12.5 MG tablet Take 2 tablets by mouth daily.   No facility-administered encounter medications on file as of 12/25/2021.    Allergies (verified) Patient has no known allergies.    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  Recommend 1 year follow up with CT or MRI.    Enlarged thyroid 04/22/2015   Seen on CTA 04/21/15: Contains multiple nodules measuring up to 2.1 cm in size, TSH wnl.  Needs thyroid ultrasound and biopsy   Heart murmur    HIV (human immunodeficiency virus infection) ([place])    HTN (hypertension)    Immune deficiency disorder ([place])    Stroke [facility])    Past Surgical History:  Procedure Laterality Date   NO PAST SURGERIES     Family History  Problem Relation Age of Onset   Hypertension Mother    Hyperlipidemia Mother    Hypertension Sister    Hypertension Sister    Heart attack Brother    Social History   Socioeconomic History   Marital status: Single    Spouse name: Not on file   Number of children: Not on file   Years of education: Not on file   Highest education level: Not on file  Occupational History   Occupation: Retired    Comment: Print shop  Tobacco Use   Smoking status: Never   Smokeless tobacco: Never  Vaping Use   Vaping Use: Never used  Substance and Sexual Activity   Alcohol use: No  Alcohol/week: 0.0 standard drinks   Drug use: No   Sexual activity: Not Currently    Comment: declined condoms  Other Topics Concern   Not on file  Social History Narrative   Current Social History 11/14/2020      Patient lives alone in a home which is 2 stories. There are no steps up to the entrance the patient uses.       Patient's method of transportation is personal car.      The highest level of education was high school diploma.      The patient currently retired from print shop.      Identified important Relationships are "My sister, Webb Silversmith."       Pets : None       Interests /  Fun: "Walking, and shooting rifle at the rifle range."       Current Stressors: "I don't have any right now."      Religious / Personal Beliefs: "[employer]"       Thomas [name], BSN, RN-BC          Social Determinants of Health   Financial Resource Strain: Not on file  Food Insecurity: Not on file  Transportation Needs: Not on file  Physical Activity: Not on file  Stress: Not on file  Social Connections: Not on file    Tobacco Counseling Counseling given: Not Answered   Clinical Intake:                 Diabetic?no         Activities of Daily Living    09/27/2021    3:01 PM 03/07/2021    9:39 AM  In your present state of health, do you have any difficulty performing the following activities:  Hearing? 1 0  Vision? 0 1  Comment  hearing loss in right ear  Difficulty concentrating or making decisions? 0 0  Walking or climbing stairs? 0 0  Dressing or bathing? 0 0  Doing errands, shopping? 0 0    Patient Care Team: Mitzi Hansen, MD as PCP - General Comer, Okey Regal, MD as Consulting Physician (Infectious Diseases) Tonia Ghent, AUD (Audiology)  Indicate any recent Medical Services you may have received from other than [facility] providers in the past year (date may be approximate).     Assessment:   This is a routine wellness examination for Thomas [name].  Hearing/Vision screen No results found.  Dietary issues and exercise activities discussed:     Goals Addressed   None   Depression Screen    10/17/2021    1:58 PM 09/27/2021    4:20 PM 04/10/2021    2:20 PM 11/23/2020    3:39 PM 11/14/2020   10:19 AM 04/03/2020    2:29 PM 02/17/2020    4:34 PM  PHQ 2/9 Scores  PHQ - 2 Score 0 0 0 0 0 0 0  PHQ- 9 Score  0  0 0  0    Fall Risk    10/17/2021    1:58 PM 09/27/2021    3:01 PM 04/10/2021    2:20 PM 03/07/2021    9:39 AM 11/23/2020    3:37 PM  Fall Risk   Falls in the past year? 0 0 0 0 0  Number falls in past yr: 0 0     Injury with Fall? 0 0      Risk for fall due to :  No Fall Risks  No Fall Risks No Fall Risks  Follow  up  Falls evaluation completed Falls evaluation completed Falls prevention discussed Falls prevention discussed    FALL RISK PREVENTION PERTAINING TO THE HOME:  Any stairs in or around the home? Yes  If so, are there any without handrails? No  Home free of loose throw rugs in walkways, pet beds, electrical cords, etc? No  Adequate lighting in your home to reduce risk of falls? Yes   ASSISTIVE DEVICES UTILIZED TO PREVENT FALLS:  Life alert? No  Use of a cane, walker or w/c? No  Grab bars in the bathroom? Yes  Shower chair or bench in shower? No  Elevated toilet seat or a handicapped toilet? No   TIMED UP AND GO:  Was the test performed? No .      Cognitive Function:        Immunizations Immunization History  Administered Date(s) Administered   Influenza-Unspecified 11/05/2017   Pneumococcal Conjugate-13 03/02/2019   Pneumococcal Polysaccharide-23 12/31/2016   Tdap 02/26/2017    TDAP status: Up to date  Flu Vaccine status: Up to date  Pneumococcal vaccine status: Up to date  Covid-19 vaccine status: Information provided on how to obtain vaccines.   Qualifies for Shingles Vaccine? Yes   Zostavax completed No   Shingrix Completed?: Yes  Screening Tests Health Maintenance  Topic Date Due   COVID-19 Vaccine (1) Never done   Zoster Vaccines- Shingrix (1 of 2) Never done   INFLUENZA VACCINE  04/02/2022   TETANUS/TDAP  02/27/2027   Pneumonia Vaccine 57+ Years old  Completed   Hepatitis C Screening  Completed   HPV VACCINES  Aged Out   Fecal DNA (Cologuard)  Discontinued    Health Maintenance  Health Maintenance Due  Topic Date Due   COVID-19 Vaccine (1) Never done   Zoster Vaccines- Shingrix (1 of 2) Never done    Colorectal cancer screening: Type of screening: Cologuard. Completed 05/27/2021. Repeat every discontinued  years  Lung Cancer Screening: (Low Dose CT Chest  recommended if Age 66-80 years, 30 pack-year currently smoking OR have quit w/in 15years.) does not qualify.   Lung Cancer Screening Referral: deferred to PCP   Additional Screening:  Hepatitis C Screening: does qualify; Completed 12/31/5016  Vision Screening: Recommended annual ophthalmology exams for early detection of glaucoma and other disorders of the eye. Is the patient up to date with their annual eye exam?  Yes  Who is the provider or what is the name of the office in which the patient attends annual eye exams?  12/25/2021  If pt is not established with a provider, would they like to be referred to a provider to establish care? No .   Dental Screening: Recommended annual dental exams for proper oral hygiene  Community Resource Referral / Chronic Care Management: CRR required this visit?  No   CCM required this visit?  No      Plan:     I have personally reviewed and noted the following in the patient's chart:   Medical and social history Use of alcohol, tobacco or illicit drugs  Current medications and supplements including opioid prescriptions. Patient is not currently taking opioid prescriptions. Functional ability and status Nutritional status Physical activity Advanced directives List of other physicians Hospitalizations, surgeries, and ER visits in previous 12 months Vitals Screenings to include cognitive, depression, and falls Referrals and appointments  In addition, I have reviewed and discussed with patient certain preventive protocols, quality metrics, and best practice recommendations. A written personalized care plan for preventive services as  well as general preventive health recommendations were provided to patient.     Judyann Munson, CMA   12/25/2021   Nurse Notes: none face to face     16 mins   Mr. Kappel , Thank you for taking time to come for your Medicare Wellness Visit. I appreciate your ongoing commitment to your health goals. Please review  the following plan we discussed and let me know if I can assist you in the future.   These are the goals we discussed:  Goals       [place] the [place]/woods (pt-stated)      Maintain same level of exercise      Walking 20 minutes 6 days per week        This is a list of the screening recommended for you and due dates:  Health Maintenance  Topic Date Due   COVID-19 Vaccine (1) Never done   Zoster (Shingles) Vaccine (1 of 2) Never done   Flu Shot  04/02/2022   Tetanus Vaccine  02/27/2027   Pneumonia Vaccine  Completed   Hepatitis C Screening: USPSTF Recommendation to screen - Ages 18-79 yo.  Completed   HPV Vaccine  Aged Out   Cologuard (Stool DNA test)  Discontinued

## 2021-12-25 NOTE — Patient Instructions (Signed)
Health Maintenance, Male Adopting a healthy lifestyle and getting preventive care are important in promoting health and wellness. Ask your health care provider about: The right schedule for you to have regular tests and exams. Things you can do on your own to prevent diseases and keep yourself healthy. What should I know about diet, weight, and exercise? Eat a healthy diet  Eat a diet that includes plenty of vegetables, fruits, low-fat dairy products, and lean protein. Do not eat a lot of foods that are high in solid fats, added sugars, or sodium. Maintain a healthy weight Body mass index (BMI) is a measurement that can be used to identify possible weight problems. It estimates body fat based on height and weight. Your health care provider can help determine your BMI and help you achieve or maintain a healthy weight. Get regular exercise Get regular exercise. This is one of the most important things you can do for your health. Most adults should: Exercise for at least 150 minutes each week. The exercise should increase your heart rate and make you sweat (moderate-intensity exercise). Do strengthening exercises at least twice a week. This is in addition to the moderate-intensity exercise. Spend less time sitting. Even light physical activity can be beneficial. Watch cholesterol and blood lipids Have your blood tested for lipids and cholesterol at 77 years of age, then have this test every 5 years. You may need to have your cholesterol levels checked more often if: Your lipid or cholesterol levels are high. You are older than 77 years of age. You are at high risk for heart disease. What should I know about cancer screening? Many types of cancers can be detected early and may often be prevented. Depending on your health history and family history, you may need to have cancer screening at various ages. This may include screening for: Colorectal cancer. Prostate cancer. Skin cancer. Lung  cancer. What should I know about heart disease, diabetes, and high blood pressure? Blood pressure and heart disease High blood pressure causes heart disease and increases the risk of stroke. This is more likely to develop in people who have high blood pressure readings or are overweight. Talk with your health care provider about your target blood pressure readings. Have your blood pressure checked: Every 3-5 years if you are 18-39 years of age. Every year if you are 40 years old or older. If you are between the ages of 65 and 75 and are a current or former smoker, ask your health care provider if you should have a one-time screening for abdominal aortic aneurysm (AAA). Diabetes Have regular diabetes screenings. This checks your fasting blood sugar level. Have the screening done: Once every three years after age 45 if you are at a normal weight and have a low risk for diabetes. More often and at a younger age if you are overweight or have a high risk for diabetes. What should I know about preventing infection? Hepatitis B If you have a higher risk for hepatitis B, you should be screened for this virus. Talk with your health care provider to find out if you are at risk for hepatitis B infection. Hepatitis C Blood testing is recommended for: Everyone born from 1945 through 1965. Anyone with known risk factors for hepatitis C. Sexually transmitted infections (STIs) You should be screened each year for STIs, including gonorrhea and chlamydia, if: You are sexually active and are younger than 77 years of age. You are older than 77 years of age and your   health care provider tells you that you are at risk for this type of infection. Your sexual activity has changed since you were last screened, and you are at increased risk for chlamydia or gonorrhea. Ask your health care provider if you are at risk. Ask your health care provider about whether you are at high risk for HIV. Your health care provider  may recommend a prescription medicine to help prevent HIV infection. If you choose to take medicine to prevent HIV, you should first get tested for HIV. You should then be tested every 3 months for as long as you are taking the medicine. Follow these instructions at home: Alcohol use Do not drink alcohol if your health care provider tells you not to drink. If you drink alcohol: Limit how much you have to 0-2 drinks a day. Know how much alcohol is in your drink. In the [place] one drink equals one 12 oz bottle of beer (355 mL), one 5 oz glass of wine (148 mL), or one 1 oz glass of hard liquor (44 mL). Lifestyle Do not use any products that contain nicotine or tobacco. These products include cigarettes, chewing tobacco, and vaping devices, such as e-cigarettes. If you need help quitting, ask your health care provider. Do not use street drugs. Do not share needles. Ask your health care provider for help if you need support or information about quitting drugs. General instructions Schedule regular health, dental, and eye exams. Stay current with your vaccines. Tell your health care provider if: You often feel depressed. You have ever been abused or do not feel safe at home. Summary Adopting a healthy lifestyle and getting preventive care are important in promoting health and wellness. Follow your health care provider's instructions about healthy diet, exercising, and getting tested or screened for diseases. Follow your health care provider's instructions on monitoring your cholesterol and blood pressure. This information is not intended to replace advice given to you by your health care provider. Make sure you discuss any questions you have with your health care provider. Document Revised: 01/08/2021 Document Reviewed: 01/08/2021 Elsevier Patient Education  2023 Elsevier Inc.  

## 2022-01-02 ENCOUNTER — Other Ambulatory Visit (HOSPITAL_COMMUNITY): Payer: Self-pay

## 2022-01-07 DIAGNOSIS — Z0100 Encounter for examination of eyes and vision without abnormal findings: Secondary | ICD-10-CM | POA: Diagnosis not present

## 2022-01-07 DIAGNOSIS — H524 Presbyopia: Secondary | ICD-10-CM | POA: Diagnosis not present

## 2022-01-11 ENCOUNTER — Other Ambulatory Visit (HOSPITAL_COMMUNITY): Payer: Self-pay

## 2022-02-04 ENCOUNTER — Other Ambulatory Visit (HOSPITAL_COMMUNITY): Payer: Self-pay

## 2022-02-05 ENCOUNTER — Other Ambulatory Visit (HOSPITAL_COMMUNITY): Payer: Self-pay

## 2022-02-06 ENCOUNTER — Other Ambulatory Visit (HOSPITAL_COMMUNITY): Payer: Self-pay

## 2022-02-22 ENCOUNTER — Other Ambulatory Visit: Payer: Self-pay | Admitting: Internal Medicine

## 2022-02-22 DIAGNOSIS — I10 Essential (primary) hypertension: Secondary | ICD-10-CM

## 2022-02-27 ENCOUNTER — Other Ambulatory Visit (HOSPITAL_COMMUNITY): Payer: Self-pay

## 2022-02-28 ENCOUNTER — Other Ambulatory Visit (HOSPITAL_COMMUNITY): Payer: Self-pay

## 2022-03-04 ENCOUNTER — Other Ambulatory Visit (HOSPITAL_COMMUNITY): Payer: Self-pay

## 2022-03-06 ENCOUNTER — Other Ambulatory Visit (HOSPITAL_COMMUNITY): Payer: Self-pay

## 2022-04-02 ENCOUNTER — Other Ambulatory Visit (HOSPITAL_COMMUNITY): Payer: Self-pay

## 2022-04-04 ENCOUNTER — Other Ambulatory Visit (HOSPITAL_COMMUNITY): Payer: Self-pay

## 2022-04-10 ENCOUNTER — Other Ambulatory Visit: Payer: Self-pay

## 2022-04-10 NOTE — Patient Outreach (Signed)
[facility]) Care Management  04/10/2022  Thomas [name] 01-28-1946 [id number]   Telephone call to patient [facility] nurse call. Patient doing well. Voices no concerns.  Discussed [facility] services and support. Patient agreeable to nurse calls.  Care Plan : RN CM Plan of Care  Updates made by Jon Billings, RN since 04/10/2022 12:00 AM     Problem: Chronic Disease Management and Care Coordination Needs HTN   Priority: High     Long-Range Goal: Development of Plan of Care for Management of HTN   Start Date: 04/10/2022  Expected End Date: 09/01/2022  Priority: High  Note:   Current Barriers:  Chronic Disease Management support and education needs related to HTN   RNCM Clinical Goal(s):  Patient will verbalize basic understanding of  HTN disease process and self health management plan as evidenced by blood pressure less than 140.80  through collaboration with RN Care manager, provider, and care team.   Interventions: Education and support related to HTN Inter-disciplinary care team collaboration (see longitudinal plan of care) Evaluation of current treatment plan related to  self management and patient's adherence to plan as established by provider   Hypertension Interventions:  (Status:  New goal.) Long Term Goal Last practice recorded BP readings:  BP Readings from Last 3 Encounters:  10/17/21 129/77  09/27/21 138/80  04/10/21 111/72  Most recent eGFR/CrCl:  Lab Results  Component Value Date   EGFR 53 (L) 09/27/2021    No components found for: "CRCL"  Evaluation of current treatment plan related to hypertension self management and patient's adherence to plan as established by provider Reviewed medications with patient and discussed importance of compliance  Patient Goals/Self-Care Activities: Take all medications as prescribed Attend all scheduled provider appointments check blood pressure weekly keep a blood pressure log take blood pressure log to all doctor  appointments call doctor for signs and symptoms of high blood pressure  Follow Up Plan:  Telephone follow up appointment with care management team member scheduled for:  January The patient has been provided with contact information for the care management team and has been advised to call with any health related questions or concerns.      Plan: RN CM will provide ongoing education and support to patient through phone calls.   RN CM will send welcome packet with consent to patient.   RN CM will send initial barriers letter, assessment, and care plan to primary care physician.   RN CM will contact patient In January and patient agrees to next contact and care plan.  Jone Baseman, RN, MSN [facility] Care Management Care Management Coordinator Direct Line [phone] Toll Free: [phone]  Fax: [phone]

## 2022-04-10 NOTE — Patient Instructions (Signed)
Patient Goals/Self-Care Activities: Take all medications as prescribed Attend all scheduled provider appointments check blood pressure weekly keep a blood pressure log take blood pressure log to all doctor appointments call doctor for signs and symptoms of high blood pressure  

## 2022-04-17 ENCOUNTER — Ambulatory Visit: Payer: Medicare HMO

## 2022-04-17 ENCOUNTER — Other Ambulatory Visit: Payer: Self-pay

## 2022-04-17 ENCOUNTER — Encounter: Payer: Self-pay | Admitting: Internal Medicine

## 2022-04-17 ENCOUNTER — Ambulatory Visit: Payer: Medicare HMO | Admitting: Internal Medicine

## 2022-04-17 VITALS — BP 122/78 | HR 73 | Temp 98.6°F | Ht 69.0 in | Wt 165.0 lb

## 2022-04-17 DIAGNOSIS — B20 Human immunodeficiency virus [HIV] disease: Secondary | ICD-10-CM

## 2022-04-17 DIAGNOSIS — N1831 Chronic kidney disease, stage 3a: Secondary | ICD-10-CM | POA: Diagnosis not present

## 2022-04-17 DIAGNOSIS — Z113 Encounter for screening for infections with a predominantly sexual mode of transmission: Secondary | ICD-10-CM

## 2022-04-17 NOTE — Progress Notes (Signed)
   Subjective:    Patient ID: Thomas [name], male    DOB: Mar 23, 1946, 76 y.o.   MRN: [id number]  HPI Here for follow up of HIV He continues on biktarvy with no issues. No new complaints.  No issues with getting, taking or tolerating the medication.  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  No concerns.  He is still grieving the loss of his sister about 1 year ago but in much better spirits overall from that.     Review of Systems  Constitutional:  Negative for fatigue.  Gastrointestinal:  Negative for diarrhea and nausea.  Skin:  Negative for rash.       Objective:   Physical Exam Eyes:     General: No scleral icterus. Pulmonary:     Effort: Pulmonary effort is normal.  Skin:    Findings: No rash.  Neurological:     Mental Status: He is alert.   SH: no tobacco        Assessment & Plan:

## 2022-04-17 NOTE — Assessment & Plan Note (Signed)
He continues to do well with no new concerns.  Labs from last visit reviewed with him.  No changes and will check his labs today. rtc in 6 months.

## 2022-04-17 NOTE — Assessment & Plan Note (Signed)
Mild chronic creat elevation. Will recheck today

## 2022-04-17 NOTE — Assessment & Plan Note (Signed)
Will screen.  Low risk, no report of recent sexual activity.

## 2022-04-18 LAB — T-HELPER CELL (CD4) - (RCID CLINIC ONLY)
CD4 % Helper T Cell: 18 % — ABNORMAL LOW (ref 33–65)
CD4 T Cell Abs: 301 /uL — ABNORMAL LOW (ref 400–1790)

## 2022-04-19 LAB — HIV-1 RNA QUANT-NO REFLEX-BLD
HIV 1 RNA Quant: NOT DETECTED Copies/mL
HIV-1 RNA Quant, Log: NOT DETECTED Log cps/mL

## 2022-04-19 LAB — CBC WITH DIFFERENTIAL/PLATELET
Absolute Monocytes: 301 cells/uL (ref 200–950)
Basophils Absolute: 9 cells/uL (ref 0–200)
Basophils Relative: 0.2 %
Eosinophils Absolute: 39 cells/uL (ref 15–500)
Eosinophils Relative: 0.9 %
HCT: 43.6 % (ref 38.5–50.0)
Hemoglobin: 14.9 g/dL (ref 13.2–17.1)
Lymphs Abs: 1797 cells/uL (ref 850–3900)
MCH: 29.7 pg (ref 27.0–33.0)
MCHC: 34.2 g/dL (ref 32.0–36.0)
MCV: 86.9 fL (ref 80.0–100.0)
MPV: 10.8 fL (ref 7.5–12.5)
Monocytes Relative: 7 %
Neutro Abs: 2154 cells/uL (ref 1500–7800)
Neutrophils Relative %: 50.1 %
Platelets: 121 10*3/uL — ABNORMAL LOW (ref 140–400)
RBC: 5.02 10*6/uL (ref 4.20–5.80)
RDW: 13.4 % (ref 11.0–15.0)
Total Lymphocyte: 41.8 %
WBC: 4.3 10*3/uL (ref 3.8–10.8)

## 2022-04-19 LAB — COMPLETE METABOLIC PANEL WITH GFR
AG Ratio: 1.5 (calc) (ref 1.0–2.5)
ALT: 13 U/L (ref 9–46)
AST: 17 U/L (ref 10–35)
Albumin: 4 g/dL (ref 3.6–5.1)
Alkaline phosphatase (APISO): 94 U/L (ref 35–144)
BUN/Creatinine Ratio: 11 (calc) (ref 6–22)
BUN: 18 mg/dL (ref 7–25)
CO2: 31 mmol/L (ref 20–32)
Calcium: 9.3 mg/dL (ref 8.6–10.3)
Chloride: 105 mmol/L (ref 98–110)
Creat: 1.57 mg/dL — ABNORMAL HIGH (ref 0.70–1.28)
Globulin: 2.7 g/dL (calc) (ref 1.9–3.7)
Glucose, Bld: 116 mg/dL — ABNORMAL HIGH (ref 65–99)
Potassium: 3.6 mmol/L (ref 3.5–5.3)
Sodium: 143 mmol/L (ref 135–146)
Total Bilirubin: 0.5 mg/dL (ref 0.2–1.2)
Total Protein: 6.7 g/dL (ref 6.1–8.1)
eGFR: 45 mL/min/{1.73_m2} — ABNORMAL LOW (ref >=60)

## 2022-04-19 LAB — RPR: RPR Ser Ql: NONREACTIVE

## 2022-04-26 ENCOUNTER — Other Ambulatory Visit (HOSPITAL_COMMUNITY): Payer: Self-pay

## 2022-05-01 ENCOUNTER — Other Ambulatory Visit (HOSPITAL_COMMUNITY): Payer: Self-pay

## 2022-05-02 ENCOUNTER — Other Ambulatory Visit: Payer: Self-pay

## 2022-05-02 NOTE — Patient Outreach (Signed)
[facility]) Care Management  05/02/2022  Thomas [name] Apr 22, 1946 [id number]   RN CM closing case:  pt will be followed for care coordination by [facility] Care Management.   Bary Leriche, RN, MSN [facility] Care Management Care Management Coordinator Direct Line [phone] Toll Free: [phone]  Fax: [phone]

## 2022-05-22 ENCOUNTER — Other Ambulatory Visit (HOSPITAL_COMMUNITY): Payer: Self-pay

## 2022-05-22 ENCOUNTER — Other Ambulatory Visit: Payer: Self-pay | Admitting: Internal Medicine

## 2022-05-22 DIAGNOSIS — B20 Human immunodeficiency virus [HIV] disease: Secondary | ICD-10-CM

## 2022-05-22 MED ORDER — BIKTARVY 50-200-25 MG PO TABS
1.0000 | ORAL_TABLET | Freq: Every day | ORAL | 5 refills | Status: DC
  Filled 2022-05-22: qty 30, 30d supply, fill #0
  Filled 2022-06-18: qty 30, 30d supply, fill #1
  Filled 2022-07-17: qty 30, 30d supply, fill #2
  Filled 2022-08-13 – 2022-08-14 (×3): qty 30, 30d supply, fill #3
  Filled 2022-09-09: qty 30, 30d supply, fill #4
  Filled 2022-10-03: qty 30, 30d supply, fill #5

## 2022-05-27 ENCOUNTER — Other Ambulatory Visit (HOSPITAL_COMMUNITY): Payer: Self-pay

## 2022-06-10 ENCOUNTER — Other Ambulatory Visit: Payer: Self-pay | Admitting: Internal Medicine

## 2022-06-10 ENCOUNTER — Other Ambulatory Visit: Payer: Self-pay | Admitting: Student

## 2022-06-10 DIAGNOSIS — E7849 Other hyperlipidemia: Secondary | ICD-10-CM

## 2022-06-10 DIAGNOSIS — I10 Essential (primary) hypertension: Secondary | ICD-10-CM

## 2022-06-18 ENCOUNTER — Other Ambulatory Visit (HOSPITAL_COMMUNITY): Payer: Self-pay

## 2022-06-24 ENCOUNTER — Other Ambulatory Visit (HOSPITAL_COMMUNITY): Payer: Self-pay

## 2022-07-17 ENCOUNTER — Other Ambulatory Visit (HOSPITAL_COMMUNITY): Payer: Self-pay

## 2022-07-22 ENCOUNTER — Other Ambulatory Visit (HOSPITAL_COMMUNITY): Payer: Self-pay

## 2022-08-13 ENCOUNTER — Other Ambulatory Visit (HOSPITAL_COMMUNITY): Payer: Self-pay

## 2022-08-14 ENCOUNTER — Other Ambulatory Visit (HOSPITAL_COMMUNITY): Payer: Self-pay

## 2022-08-14 ENCOUNTER — Other Ambulatory Visit: Payer: Self-pay

## 2022-09-09 ENCOUNTER — Other Ambulatory Visit: Payer: Self-pay

## 2022-09-09 ENCOUNTER — Other Ambulatory Visit (HOSPITAL_COMMUNITY): Payer: Self-pay

## 2022-09-10 ENCOUNTER — Telehealth: Payer: Self-pay

## 2022-09-10 NOTE — Patient Outreach (Signed)
  Care Coordination   Follow Up Visit Note   09/10/2022 Name: Thomas [name] MRN: [id number] DOB: 04/24/1946  Thomas [name] is a 77 y.o. year old male who sees Thomas Blamer, DO for primary care. I spoke with  Thomas [name] by phone today.  What matters to the patients health and wellness today?  none    Goals Addressed             This Visit's Progress    Hypertension Management       Care Coordination Interventions: Evaluation of current treatment plan related to hypertension self management and patient's adherence to plan as established by provider Provided education to patient re: stroke prevention, s/s of heart attack and stroke Discussed plans with patient for ongoing care management follow up and provided patient with direct contact information for care management team          SDOH assessments and interventions completed:  Yes  SDOH Interventions Today    Flowsheet Row Most Recent Value  SDOH Interventions   Utilities Interventions Intervention Not Indicated  Social Connections Interventions Intervention Not Indicated        Care Coordination Interventions:  Yes, provided   Follow up plan: Follow up call scheduled for April    Encounter Outcome:  Pt. Visit Completed   Jone Baseman, RN, MSN [place] [facility] Care Management Coordinator Direct Line [phone]

## 2022-09-10 NOTE — Patient Instructions (Signed)
Visit Information  Thank you for taking time to visit with me today. Please don't hesitate to contact me if I can be of assistance to you.   Following are the goals we discussed today:   Goals Addressed             This Visit's Progress    Hypertension Management       Care Coordination Interventions: Evaluation of current treatment plan related to hypertension self management and patient's adherence to plan as established by provider Provided education to patient re: stroke prevention, s/s of heart attack and stroke Discussed plans with patient for ongoing care management follow up and provided patient with direct contact information for care management team          Our next appointment is by telephone on 12/10/22 at 1000  Please call the care guide team at [phone] if you need to cancel or reschedule your appointment.   If you are experiencing a Mental Health or [place] or need someone to talk to, please call the Suicide and Crisis Lifeline: 988   Patient verbalizes understanding of instructions and care plan provided today and agrees to view in [place]. Active MyChart status and patient understanding of how to access instructions and care plan via MyChart confirmed with patient.     Telephone follow up appointment with care management team member scheduled for: April  Suzane Vanderweide J Raeleigh Guinn, [place], MSN [place] [facility] Care Management Coordinator Direct Line [phone]

## 2022-09-11 ENCOUNTER — Telehealth: Payer: Self-pay | Admitting: Student

## 2022-09-11 ENCOUNTER — Other Ambulatory Visit: Payer: Self-pay

## 2022-09-11 DIAGNOSIS — E7849 Other hyperlipidemia: Secondary | ICD-10-CM

## 2022-09-11 DIAGNOSIS — I10 Essential (primary) hypertension: Secondary | ICD-10-CM

## 2022-09-11 NOTE — Telephone Encounter (Signed)
LOV 12/2021. Called pt to schedule an appt - no answer and unable to leave a message.

## 2022-09-13 NOTE — Telephone Encounter (Signed)
Can you schedule pt an appt per Dr Nikki Dom? Thanks

## 2022-09-17 NOTE — Telephone Encounter (Signed)
Patient sent message via my chart to contact our office to schedule an appointment.

## 2022-10-03 ENCOUNTER — Other Ambulatory Visit (HOSPITAL_COMMUNITY): Payer: Self-pay

## 2022-10-10 ENCOUNTER — Other Ambulatory Visit: Payer: Self-pay

## 2022-10-16 ENCOUNTER — Other Ambulatory Visit (HOSPITAL_COMMUNITY): Payer: Self-pay

## 2022-10-16 ENCOUNTER — Ambulatory Visit: Payer: Medicare HMO | Admitting: Internal Medicine

## 2022-10-16 ENCOUNTER — Other Ambulatory Visit: Payer: Self-pay

## 2022-10-16 ENCOUNTER — Encounter: Payer: Self-pay | Admitting: Internal Medicine

## 2022-10-16 VITALS — BP 133/78 | HR 86 | Temp 97.7°F | Ht 69.0 in | Wt 170.0 lb

## 2022-10-16 DIAGNOSIS — B20 Human immunodeficiency virus [HIV] disease: Secondary | ICD-10-CM | POA: Diagnosis not present

## 2022-10-16 DIAGNOSIS — E7849 Other hyperlipidemia: Secondary | ICD-10-CM

## 2022-10-16 MED ORDER — BIKTARVY 50-200-25 MG PO TABS
1.0000 | ORAL_TABLET | Freq: Every day | ORAL | 11 refills | Status: DC
  Filled 2022-10-16 – 2022-11-18 (×3): qty 30, 30d supply, fill #0
  Filled 2022-12-11: qty 30, 30d supply, fill #1
  Filled 2023-01-07: qty 30, 30d supply, fill #2
  Filled 2023-02-03: qty 30, 30d supply, fill #3
  Filled 2023-02-27: qty 30, 30d supply, fill #4
  Filled 2023-03-24: qty 30, 30d supply, fill #5
  Filled 2023-04-24: qty 30, 30d supply, fill #6
  Filled 2023-05-19: qty 30, 30d supply, fill #7
  Filled 2023-06-17: qty 30, 30d supply, fill #8
  Filled 2023-07-08: qty 30, 30d supply, fill #9
  Filled 2023-07-29: qty 30, 30d supply, fill #10
  Filled 2023-08-28: qty 30, 30d supply, fill #11

## 2022-10-16 NOTE — Progress Notes (Unsigned)
   Subjective:    Patient ID: Thomas [name], male    DOB: 21-Jan-1946, 77 y.o.   MRN: [id number]  HPI Thomas [name] is here for routine follow up of HIV He continues on Biktarvy and no missed doses.  He has bought and moved in to his new property in [place] and is very happy with it.  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  He has no issues or concerns.     Review of Systems  Constitutional:  Negative for fatigue.  Gastrointestinal:  Negative for diarrhea and nausea.  Skin:  Negative for rash.       Objective:   Physical Exam Pulmonary:     Effort: Pulmonary effort is normal.  Skin:    Findings: No rash.  Neurological:     General: No focal deficit present.     Mental Status: He is alert.  Psychiatric:        Mood and Affect: Mood normal.    SH: no tobacco       Assessment & Plan:

## 2022-10-17 ENCOUNTER — Encounter: Payer: Self-pay | Admitting: Internal Medicine

## 2022-10-17 LAB — T-HELPER CELL (CD4) - (RCID CLINIC ONLY)
CD4 % Helper T Cell: 17 % — ABNORMAL LOW (ref 33–65)
CD4 T Cell Abs: 291 /uL — ABNORMAL LOW (ref 400–1790)

## 2022-10-17 NOTE — Assessment & Plan Note (Addendum)
He continues to do well and no concerns today.  No changes indicated and will do his labs and he can follow up in 6 months.   I have personally spent 30 minutes involved in face-to-face and non-face-to-face activities for this patient on the day of the visit. Professional time spent includes the following activities: Preparing to see the patient (review of tests), Obtaining and/or reviewing separately obtained history (admission/discharge record), Performing a medically appropriate examination and/or evaluation , Ordering medications/tests/procedures, referring and communicating with other health care professionals, Documenting clinical information in the EMR, Independently interpreting results (not separately reported), Communicating results to the patient/family/caregiver, Counseling and educating the patient/family/caregiver and Care coordination (not separately reported).

## 2022-10-19 LAB — LIPID PANEL
Cholesterol: 101 mg/dL (ref <200)
HDL: 47 mg/dL (ref >=40)
LDL Cholesterol (Calc): 41 mg/dL (calc)
Non-HDL Cholesterol (Calc): 54 mg/dL (calc) (ref <130)
Total CHOL/HDL Ratio: 2.1 (calc) (ref <5.0)
Triglycerides: 45 mg/dL (ref <150)

## 2022-10-19 LAB — HIV-1 RNA QUANT-NO REFLEX-BLD
HIV 1 RNA Quant: NOT DETECTED Copies/mL
HIV-1 RNA Quant, Log: NOT DETECTED Log cps/mL

## 2022-10-31 ENCOUNTER — Other Ambulatory Visit (HOSPITAL_COMMUNITY): Payer: Self-pay

## 2022-10-31 ENCOUNTER — Other Ambulatory Visit: Payer: Self-pay

## 2022-11-04 ENCOUNTER — Other Ambulatory Visit: Payer: Self-pay

## 2022-11-08 ENCOUNTER — Other Ambulatory Visit (HOSPITAL_COMMUNITY): Payer: Self-pay

## 2022-11-18 ENCOUNTER — Other Ambulatory Visit: Payer: Self-pay

## 2022-11-18 ENCOUNTER — Telehealth: Payer: Self-pay

## 2022-11-18 ENCOUNTER — Other Ambulatory Visit (HOSPITAL_COMMUNITY): Payer: Self-pay

## 2022-11-18 NOTE — Telephone Encounter (Signed)
[facility] Patient Advocate Encounter   I was successful in securing patient a $5,000.00 grant from [employer] (PA[employer]) to provide copayment coverage for Biktarvy.  This will make the out of pocket cost $0.00.     I have spoken with the patient.    The billing information is as follows and has been shared with [facility].             Patient knows to call the office with questions or concerns.  Thomas [name], [facility] Patient [facility] for [facility] Phone: [phone] Fax:  [phone]

## 2022-12-09 ENCOUNTER — Other Ambulatory Visit: Payer: Self-pay | Admitting: Student

## 2022-12-09 DIAGNOSIS — I10 Essential (primary) hypertension: Secondary | ICD-10-CM

## 2022-12-09 DIAGNOSIS — E7849 Other hyperlipidemia: Secondary | ICD-10-CM

## 2022-12-10 ENCOUNTER — Ambulatory Visit: Payer: Self-pay

## 2022-12-10 NOTE — Patient Outreach (Signed)
  Care Coordination   Follow Up Visit Note   12/10/2022 Name: Thomas [name] MRN: [id number] DOB: [birth year]  Thomas [name] is a 77 y.o. year old male who sees [name], DO for primary care. I spoke with  Thomas [name] by phone today. Patient doing well.  Enjoying his new home.  Continues with HTN management.  What matters to the patients health and wellness today?  Maintain health    Goals Addressed             This Visit's Progress    Hypertension Management       Care Coordination Interventions: Evaluation of current treatment plan related to hypertension self management and patient's adherence to plan as established by provider Provided education to patient re: stroke prevention, s/s of heart attack and stroke Discussed plans with patient for ongoing care management follow up and provided patient with direct contact information for care management team  Patient to make appointment with PCP office. Interventions Today    Flowsheet Row Most Recent Value  General Interventions   General Interventions Discussed/Reviewed General Interventions Discussed  Exercise Interventions   Exercise Discussed/Reviewed Exercise Discussed  Education Interventions   Education Provided Provided Education  Provided Verbal Education On Nutrition, Exercise  [Blood pressure monitoring]  Mental Health Interventions   Mental Health Discussed/Reviewed Mental Health Discussed, Depression  Nutrition Interventions   Nutrition Discussed/Reviewed Nutrition Discussed, Decreasing salt  Pharmacy Interventions   Pharmacy Dicussed/Reviewed Pharmacy Topics Discussed, Medications and their functions             SDOH assessments and interventions completed:  Yes   SDOH Interventions Today    Flowsheet Row Most Recent Value  SDOH Interventions   Housing Interventions Intervention Not Indicated  Transportation Interventions Intervention Not Indicated        Care Coordination  Interventions:  Yes, provided   Follow up plan: Follow up call scheduled for July    Encounter Outcome:  Pt. Visit Completed   Bary Leriche, RN, MSN [facility] Care Management Care Management Coordinator Direct Line [phone]

## 2022-12-10 NOTE — Telephone Encounter (Signed)
Patient is scheduled to come in Wednesday April 17 @3 :15

## 2022-12-10 NOTE — Telephone Encounter (Signed)
Called pt again no answer

## 2022-12-10 NOTE — Patient Instructions (Signed)
Visit Information  Thank you for taking time to visit with me today. Please don't hesitate to contact me if I can be of assistance to you.   Following are the goals we discussed today:   Goals Addressed             This Visit's Progress    Hypertension Management       Care Coordination Interventions: Evaluation of current treatment plan related to hypertension self management and patient's adherence to plan as established by provider Provided education to patient re: stroke prevention, s/s of heart attack and stroke Discussed plans with patient for ongoing care management follow up and provided patient with direct contact information for care management team  Patient to make appointment with PCP office. Interventions Today    Flowsheet Row Most Recent Value  General Interventions   General Interventions Discussed/Reviewed General Interventions Discussed  Exercise Interventions   Exercise Discussed/Reviewed Exercise Discussed  Education Interventions   Education Provided Provided Education  Provided Verbal Education On Nutrition, Exercise  [Blood pressure monitoring]  Mental Health Interventions   Mental Health Discussed/Reviewed Mental Health Discussed, Depression  Nutrition Interventions   Nutrition Discussed/Reviewed Nutrition Discussed, Decreasing salt  Pharmacy Interventions   Pharmacy Dicussed/Reviewed Pharmacy Topics Discussed, Medications and their functions             Our next appointment is by telephone on 03/11/23 at 1130  Please call the care guide team at [phone] if you need to cancel or reschedule your appointment.   If you are experiencing a Mental Health or Behavioral Health Crisis or need someone to talk to, please call the Suicide and Crisis Lifeline: 988   Patient verbalizes understanding of instructions and care plan provided today and agrees to view in MyChart. Active MyChart status and patient understanding of how to access instructions and  care plan via MyChart confirmed with patient.     The patient has been provided with contact information for the care management team and has been advised to call with any health related questions or concerns.   Bary Leriche, RN, MSN [facility] Care Management Care Management Coordinator Direct Line [phone]

## 2022-12-10 NOTE — Telephone Encounter (Signed)
LOV 12/2021 -called pt to schedule an appt; no answer nor vm to leave a message.

## 2022-12-11 ENCOUNTER — Other Ambulatory Visit (HOSPITAL_COMMUNITY): Payer: Self-pay

## 2022-12-12 ENCOUNTER — Other Ambulatory Visit: Payer: Self-pay

## 2022-12-18 ENCOUNTER — Ambulatory Visit (INDEPENDENT_AMBULATORY_CARE_PROVIDER_SITE_OTHER): Payer: Medicare HMO

## 2022-12-18 ENCOUNTER — Other Ambulatory Visit: Payer: Self-pay

## 2022-12-18 VITALS — BP 119/72 | HR 72 | Temp 98.0°F | Ht 69.0 in | Wt 167.0 lb

## 2022-12-18 DIAGNOSIS — B20 Human immunodeficiency virus [HIV] disease: Secondary | ICD-10-CM

## 2022-12-18 DIAGNOSIS — I129 Hypertensive chronic kidney disease with stage 1 through stage 4 chronic kidney disease, or unspecified chronic kidney disease: Secondary | ICD-10-CM

## 2022-12-18 DIAGNOSIS — N1831 Chronic kidney disease, stage 3a: Secondary | ICD-10-CM | POA: Diagnosis not present

## 2022-12-18 DIAGNOSIS — I10 Essential (primary) hypertension: Secondary | ICD-10-CM | POA: Diagnosis not present

## 2022-12-18 NOTE — Progress Notes (Unsigned)
   CC: checkup  HPI:  Mr.Lonnell [name] is a 77 y.o. with past medical history as below who presents for checkup. See detailed assessment and plan for HPI.  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  Recommend 1 year follow up with CT or MRI.    Enlarged thyroid 04/22/2015   Seen on CTA 04/21/15: Contains multiple nodules measuring up to 2.1 cm in size, TSH wnl.  Needs thyroid ultrasound and biopsy   Heart murmur    HIV (human immunodeficiency virus infection)    HTN (hypertension)    Immune deficiency disorder    Stroke    Review of Systems:  See detailed assessment and plan for pertinent ROS.  Physical Exam:  Vitals:   12/18/22 1551  BP: 119/72  Pulse: 72  Temp: 98 F (36.7 C)  TempSrc: Oral  SpO2: 99%  Weight: 167 lb (75.8 kg)  Height:  (1.753 m)   Physical Exam Constitutional:      General: He is not in acute distress. HENT:     Head: Normocephalic and atraumatic.  Eyes:     General: No scleral icterus.    Extraocular Movements: Extraocular movements intact.  Cardiovascular:     Rate and Rhythm: Normal rate and regular rhythm.     Heart sounds: No murmur heard. Pulmonary:     Effort: Pulmonary effort is normal.     Breath sounds: No wheezing, rhonchi or rales.  Musculoskeletal:     Cervical back: Neck supple.     Right lower leg: No edema.     Left lower leg: No edema.  Skin:    General: Skin is warm and dry.     Coloration: Skin is not jaundiced.  Neurological:     Mental Status: He is alert and oriented to person, place, and time.  Psychiatric:        Mood and Affect: Mood normal.        Behavior: Behavior normal.      Assessment & Plan:   See Encounters Tab for problem based charting.  Essential hypertension Patient  presents with history of hypertension.  Blood pressure 119/72 today.  He is taking amlodipine 5 mg, lisinopril-HCTZ 20-12.5 mg 2 tablets daily.  He denies chest pain, shortness of breath, headache, vision changes.  Denies any symptoms of hypotension such as dizziness or fainting.  He does report 1 home reading with a systolic of 88 but was asymptomatic. -Continue amlodipine 5 mg daily -Continue lisinopril-HCTZ 20-12.5 mg 2 tablets daily -Repeat BMP  HIV disease (HCC) Patient presents with history of HIV on Biktarvy.  Endorses good medication compliance and denies any signs of infection such as fever, chills, rashes, cough, shortness of breath.  Recent HIV RNA quant undetectable.  CD4 count WNL. -Continue Biktarvy -Continue follow-up with Dr. Lindi Adie in infectious disease  Patient discussed with Dr. Mikey Bussing

## 2022-12-19 LAB — BMP8+ANION GAP
Anion Gap: 12 mmol/L (ref 10.0–18.0)
BUN/Creatinine Ratio: 13 (ref 10–24)
BUN: 23 mg/dL (ref 8–27)
CO2: 27 mmol/L (ref 20–29)
Calcium: 9.4 mg/dL (ref 8.6–10.2)
Chloride: 102 mmol/L (ref 96–106)
Creatinine, Ser: 1.71 mg/dL — ABNORMAL HIGH (ref 0.76–1.27)
Glucose: 103 mg/dL — ABNORMAL HIGH (ref 70–99)
Potassium: 4.4 mmol/L (ref 3.5–5.2)
Sodium: 141 mmol/L (ref 134–144)
eGFR: 41 mL/min/{1.73_m2} — ABNORMAL LOW (ref >59)

## 2022-12-19 NOTE — Assessment & Plan Note (Signed)
Patient presents with history of HIV on Biktarvy.  Endorses good medication compliance and denies any signs of infection such as fever, chills, rashes, cough, shortness of breath.  Recent HIV RNA quant undetectable.  CD4 count WNL. -Continue Biktarvy -Continue follow-up with Dr. Lindi Adie in infectious disease

## 2022-12-19 NOTE — Progress Notes (Signed)
Internal Medicine Clinic Attending  Case discussed with the resident at the time of the visit.  We reviewed the resident's history and exam and pertinent patient test results.  I agree with the assessment, diagnosis, and plan of care documented in the resident's note.  

## 2022-12-19 NOTE — Assessment & Plan Note (Addendum)
Patient presents with history of hypertension.  Blood pressure 119/72 today.  He is taking amlodipine 5 mg, lisinopril-HCTZ 20-12.5 mg 2 tablets daily.  He denies chest pain, shortness of breath, headache, vision changes.  Denies any symptoms of hypotension such as dizziness or fainting.  He does report 1 home reading with a systolic of 88 but was asymptomatic. -Continue amlodipine 5 mg daily -Continue lisinopril-HCTZ 20-12.5 mg 2 tablets daily -Repeat BMP

## 2023-01-07 ENCOUNTER — Other Ambulatory Visit (HOSPITAL_COMMUNITY): Payer: Self-pay

## 2023-01-09 ENCOUNTER — Other Ambulatory Visit: Payer: Self-pay

## 2023-02-03 ENCOUNTER — Other Ambulatory Visit: Payer: Self-pay

## 2023-02-05 ENCOUNTER — Other Ambulatory Visit (HOSPITAL_COMMUNITY): Payer: Self-pay

## 2023-02-27 ENCOUNTER — Other Ambulatory Visit (HOSPITAL_COMMUNITY): Payer: Self-pay

## 2023-02-28 ENCOUNTER — Other Ambulatory Visit (HOSPITAL_COMMUNITY): Payer: Self-pay

## 2023-03-10 ENCOUNTER — Other Ambulatory Visit: Payer: Self-pay | Admitting: Student

## 2023-03-10 DIAGNOSIS — I10 Essential (primary) hypertension: Secondary | ICD-10-CM

## 2023-03-10 DIAGNOSIS — E7849 Other hyperlipidemia: Secondary | ICD-10-CM

## 2023-03-11 ENCOUNTER — Ambulatory Visit: Payer: Self-pay

## 2023-03-11 NOTE — Patient Instructions (Signed)
Visit Information  Thank you for taking time to visit with me today. Please don't hesitate to contact me if I can be of assistance to you.   Following are the goals we discussed today:   Goals Addressed             This Visit's Progress    Hypertension Management       Care Coordination Interventions: Evaluation of current treatment plan related to hypertension self management and patient's adherence to plan as established by provider Provided education to patient re: stroke prevention, s/s of heart attack and stroke Discussed plans with patient for ongoing care management follow up and provided patient with direct contact information for care management team  Patient doing well.  Saw PCP in April. No problems.  Discussed HTN and management. No concerns.             Our next appointment is by telephone on 06/03/23 at 1130  Please call the care guide team at [phone] if you need to cancel or reschedule your appointment.   If you are experiencing a Mental Health or Behavioral Health Crisis or need someone to talk to, please call the Suicide and Crisis Lifeline: 988   Patient verbalizes understanding of instructions and care plan provided today and agrees to view in MyChart. Active MyChart status and patient understanding of how to access instructions and care plan via MyChart confirmed with patient.     The patient has been provided with contact information for the care management team and has been advised to call with any health related questions or concerns.   Bary Leriche, RN, MSN [facility] Care Management Care Management Coordinator Direct Line [phone]

## 2023-03-11 NOTE — Patient Outreach (Signed)
  Care Coordination   Follow Up Visit Note   03/11/2023 Name: Thomas [name] MRN: [id number] DOB: 11/01/1945  Thomas [name] is a 77 y.o. year old male who sees [name], DO for primary care. I spoke with  Thomas [name] by phone today.  What matters to the patients health and wellness today?  Health management    Goals Addressed             This Visit's Progress    Hypertension Management       Care Coordination Interventions: Evaluation of current treatment plan related to hypertension self management and patient's adherence to plan as established by provider Provided education to patient re: stroke prevention, s/s of heart attack and stroke Discussed plans with patient for ongoing care management follow up and provided patient with direct contact information for care management team  Patient doing well.  Saw PCP in April. No problems.  Discussed HTN and management. No concerns.             SDOH assessments and interventions completed:  Yes  SDOH Interventions Today    Flowsheet Row Most Recent Value  SDOH Interventions   Food Insecurity Interventions Intervention Not Indicated  Housing Interventions Intervention Not Indicated        Care Coordination Interventions:  Yes, provided   Follow up plan: Follow up call scheduled for October    Encounter Outcome:  Pt. Visit Completed   Bary Leriche, RN, MSN [facility] Care Management Care Management Coordinator Direct Line [phone]

## 2023-03-24 ENCOUNTER — Other Ambulatory Visit (HOSPITAL_COMMUNITY): Payer: Self-pay

## 2023-03-24 ENCOUNTER — Other Ambulatory Visit: Payer: Self-pay

## 2023-03-26 ENCOUNTER — Other Ambulatory Visit (HOSPITAL_COMMUNITY): Payer: Self-pay

## 2023-04-17 ENCOUNTER — Encounter: Payer: Self-pay | Admitting: Internal Medicine

## 2023-04-17 ENCOUNTER — Ambulatory Visit: Payer: Medicare HMO | Admitting: Internal Medicine

## 2023-04-17 ENCOUNTER — Other Ambulatory Visit: Payer: Self-pay

## 2023-04-17 VITALS — BP 131/73 | HR 76 | Temp 98.3°F | Resp 16 | Wt 166.0 lb

## 2023-04-17 DIAGNOSIS — B20 Human immunodeficiency virus [HIV] disease: Secondary | ICD-10-CM | POA: Diagnosis not present

## 2023-04-17 DIAGNOSIS — Z113 Encounter for screening for infections with a predominantly sexual mode of transmission: Secondary | ICD-10-CM

## 2023-04-17 DIAGNOSIS — N1831 Chronic kidney disease, stage 3a: Secondary | ICD-10-CM

## 2023-04-17 NOTE — Assessment & Plan Note (Signed)
He continues to do well on Biktarvy and no changes indicated.  Labs today and previous labs reviewed with him. Follow up in 6 months,

## 2023-04-17 NOTE — Assessment & Plan Note (Signed)
Will screen today 

## 2023-04-17 NOTE — Progress Notes (Signed)
   Subjective:    Patient ID: Thomas [name], male    DOB: [birth year], 77 y.o.   MRN: [id number]  HPI Thomas [name] is here for follow up of HIV He continues on Biktarvy with no missed doses.  No  issues getting or taking his medication.  Has pictures of his cats (7) and previous pictures of recruits.     Review of Systems  Constitutional:  Negative for fatigue.  Gastrointestinal:  Negative for diarrhea and nausea.  Skin:  Negative for rash.       Objective:   Physical Exam Pulmonary:     Effort: Pulmonary effort is normal.  Skin:    Findings: No rash.  Neurological:     General: No focal deficit present.     Mental Status: He is alert.  Psychiatric:        Mood and Affect: Mood normal.    SH: no tobacco       Assessment & Plan:

## 2023-04-17 NOTE — Assessment & Plan Note (Signed)
Previous creat with stable mild elevation at 1.7 ( previous 1.5).   No changes indicated to [employer]

## 2023-04-18 LAB — T-HELPER CELL (CD4) - (RCID CLINIC ONLY)
CD4 % Helper T Cell: 23 % — ABNORMAL LOW (ref 33–65)
CD4 T Cell Abs: 228 /uL — ABNORMAL LOW (ref 400–1790)

## 2023-04-21 LAB — CBC WITH DIFFERENTIAL/PLATELET
Absolute Monocytes: 323 {cells}/uL (ref 200–950)
Basophils Absolute: 11 {cells}/uL (ref 0–200)
Basophils Relative: 0.3 %
Eosinophils Absolute: 11 {cells}/uL — ABNORMAL LOW (ref 15–500)
Eosinophils Relative: 0.3 %
HCT: 43.1 % (ref 38.5–50.0)
Hemoglobin: 14.7 g/dL (ref 13.2–17.1)
Lymphs Abs: 1220 {cells}/uL (ref 850–3900)
MCH: 29.9 pg (ref 27.0–33.0)
MCHC: 34.1 g/dL (ref 32.0–36.0)
MCV: 87.6 fL (ref 80.0–100.0)
MPV: 11 fL (ref 7.5–12.5)
Monocytes Relative: 8.5 %
Neutro Abs: 2234 {cells}/uL (ref 1500–7800)
Neutrophils Relative %: 58.8 %
Platelets: 114 10*3/uL — ABNORMAL LOW (ref 140–400)
RBC: 4.92 10*6/uL (ref 4.20–5.80)
RDW: 12.5 % (ref 11.0–15.0)
Total Lymphocyte: 32.1 %
WBC: 3.8 10*3/uL (ref 3.8–10.8)

## 2023-04-21 LAB — HIV-1 RNA QUANT-NO REFLEX-BLD
HIV 1 RNA Quant: NOT DETECTED {copies}/mL
HIV-1 RNA Quant, Log: NOT DETECTED {Log_copies}/mL

## 2023-04-21 LAB — RPR: RPR Ser Ql: NONREACTIVE

## 2023-04-24 ENCOUNTER — Other Ambulatory Visit (HOSPITAL_COMMUNITY): Payer: Self-pay

## 2023-04-28 ENCOUNTER — Other Ambulatory Visit (HOSPITAL_COMMUNITY): Payer: Self-pay

## 2023-05-19 ENCOUNTER — Other Ambulatory Visit (HOSPITAL_COMMUNITY): Payer: Self-pay

## 2023-06-03 ENCOUNTER — Ambulatory Visit: Payer: Self-pay

## 2023-06-03 NOTE — Patient Instructions (Signed)
Visit Information  Thank you for taking time to visit with me today. Please don't hesitate to contact me if I can be of assistance to you.   Following are the goals we discussed today:   Goals Addressed             This Visit's Progress    Hypertension Management       Care Coordination Interventions: Evaluation of current treatment plan related to hypertension self management and patient's adherence to plan as established by provider Provided education to patient re: stroke prevention, s/s of heart attack and stroke Discussed plans with patient for ongoing care management follow up and provided patient with direct contact information for care management team  Patient doing good.  Seen by ID in August.  Follow up in 6 months.  Reiterated HTN management.  No concerns.             Our next appointment is by telephone on 09/04/23 at 1130 am  Please call the care guide team at [phone] if you need to cancel or reschedule your appointment.   If you are experiencing a Mental Health or Behavioral Health Crisis or need someone to talk to, please call the Suicide and Crisis Lifeline: 988   Patient verbalizes understanding of instructions and care plan provided today and agrees to view in MyChart. Active MyChart status and patient understanding of how to access instructions and care plan via MyChart confirmed with patient.     The patient has been provided with contact information for the care management team and has been advised to call with any health related questions or concerns.   Bary Leriche, RN, MSN [facility] Management Community Coordinator Direct Dial: [phone]  Fax: [phone] Website: [email or web address]

## 2023-06-03 NOTE — Patient Outreach (Signed)
Care Coordination   Follow Up Visit Note   06/03/2023 Name: Thomas [name] MRN: [id number] DOB: [birth year]  Thomas [name] is a 77 y.o. year old male who sees Thomas Morel, DO for primary care. I spoke with  Thomas [name] by phone today.  What matters to the patients health and wellness today?  Maintain health    Goals Addressed             This Visit's Progress    Hypertension Management       Care Coordination Interventions: Evaluation of current treatment plan related to hypertension self management and patient's adherence to plan as established by provider Provided education to patient re: stroke prevention, s/s of heart attack and stroke Discussed plans with patient for ongoing care management follow up and provided patient with direct contact information for care management team  Patient doing good.  Seen by ID in August.  Follow up in 6 months.  Reiterated HTN management.  No concerns.             SDOH assessments and interventions completed:  Yes  SDOH Interventions Today    Flowsheet Row Most Recent Value  SDOH Interventions   Transportation Interventions Intervention Not Indicated  Health Literacy Interventions Intervention Not Indicated        Care Coordination Interventions:  Yes, provided   Follow up plan: Follow up call scheduled for January    Encounter Outcome:  Patient Visit Completed   Bary Leriche, RN, MSN [place]  [facility], [facility] Management Community Coordinator Direct Dial: [phone]  Fax: [phone] Website: [email or web address]

## 2023-06-17 ENCOUNTER — Other Ambulatory Visit: Payer: Self-pay

## 2023-06-17 ENCOUNTER — Other Ambulatory Visit (HOSPITAL_COMMUNITY): Payer: Self-pay

## 2023-06-17 NOTE — Progress Notes (Signed)
Specialty Pharmacy Refill Coordination Note  Thomas [name] is a 77 y.o. male contacted today regarding refills of specialty medication(s) Bictegravir-Emtricitab-Tenofov   Patient requested Delivery   Delivery date: 06/23/23   Verified address: [address]   [place] [address]   Medication will be filled on 06/20/23.

## 2023-06-20 ENCOUNTER — Other Ambulatory Visit: Payer: Self-pay

## 2023-07-08 ENCOUNTER — Other Ambulatory Visit: Payer: Self-pay

## 2023-07-08 NOTE — Progress Notes (Signed)
Specialty Pharmacy Refill Coordination Note  Thomas [name] is a 77 y.o. male contacted today regarding refills of specialty medication(s) Bictegravir-Emtricitab-Tenofov   Patient requested Delivery   Delivery date: 07/16/23   Verified address: [address] [place] [address]   [place] [address]   Medication will be filled on 07/15/23.

## 2023-07-15 ENCOUNTER — Other Ambulatory Visit: Payer: Self-pay

## 2023-07-29 ENCOUNTER — Other Ambulatory Visit: Payer: Self-pay

## 2023-07-29 ENCOUNTER — Other Ambulatory Visit (HOSPITAL_COMMUNITY): Payer: Self-pay

## 2023-07-29 NOTE — Progress Notes (Signed)
Specialty Pharmacy Refill Coordination Note  Thomas [name] is a 77 y.o. male contacted today regarding refills of specialty medication(s) Bictegravir-Emtricitab-Tenofov   Patient requested Delivery   Delivery date: 08/08/23   Verified address: [address] [place] [address]   Medication will be filled on 08/07/23.

## 2023-07-29 NOTE — Progress Notes (Signed)
Specialty Pharmacy Ongoing Clinical Assessment Note  Thomas [name] is a 77 y.o. male who is being followed by the specialty pharmacy service for RxSp HIV   Patient's specialty medication(s) reviewed today: Bictegravir-Emtricitab-Tenofov   Missed doses in the last 4 weeks: 0   Patient/Caregiver did not have any additional questions or concerns.   Therapeutic benefit summary: Patient is achieving benefit   Adverse events/side effects summary: No adverse events/side effects   Patient's therapy is appropriate to: Continue    Goals Addressed             This Visit's Progress    Achieve Undetectable HIV Viral Load < 20       Patient is on track. Patient will maintain adherence.  Patient's viral load remains undetectable as of 04/17/23.          Follow up:  6 months  Servando Snare Specialty Pharmacist

## 2023-08-28 ENCOUNTER — Other Ambulatory Visit: Payer: Self-pay

## 2023-08-28 NOTE — Progress Notes (Signed)
Specialty Pharmacy Refill Coordination Note  Kadarian Ariail is a 77 y.o. male contacted today regarding refills of specialty medication(s) Bictegravir-Emtricitab-Tenofov Susanne Borders)   Patient requested Delivery   Delivery date: 09/04/23   Verified address: [address] [place] [address]   Medication will be filled on 09/02/23.

## 2023-09-02 ENCOUNTER — Other Ambulatory Visit: Payer: Self-pay

## 2023-09-04 ENCOUNTER — Ambulatory Visit: Payer: Self-pay

## 2023-09-04 NOTE — Patient Outreach (Signed)
  Care Coordination   09/04/2023 Name: Thomas [name] MRN: [id number] DOB: 04/07/1946   Care Coordination Outreach Attempts:  An unsuccessful outreach was attempted for an appointment today.  Follow Up Plan:  Additional outreach attempts will be made to offer the patient complex care management information and services.   Encounter Outcome:  No Answer   Care Coordination Interventions:  No, not indicated    [name], RN, MSN RN Care Manager [facility], Population Health Direct Dial: [phone]  Fax: [phone] Website: [email or web address]

## 2023-09-08 ENCOUNTER — Telehealth: Payer: Self-pay

## 2023-09-08 NOTE — Patient Outreach (Signed)
  Care Coordination   Follow Up Visit Note   09/08/2023 Name: Thomas [name] MRN: [id number] DOB: October 06, 1945  Thomas [name] is a 78 y.o. year old male who sees Jolaine Pac, DO for primary care. I spoke with  Thomas [name] by phone today.  What matters to the patients health and wellness today?  Maintain health    Goals Addressed             This Visit's Progress    Hypertension Management       Care Coordination Interventions: Evaluation of current treatment plan related to hypertension self management and patient's adherence to plan as established by provider Provided education to patient re: stroke prevention, s/s of heart attack and stroke Discussed plans with patient for ongoing care management follow up and provided patient with direct contact information for care management team  Patient doing good.  He reports blood pressure continues to do well.  No numbers to report but blood pressure less than 140/80.  Reviewed  HTN management.  No concerns.             SDOH assessments and interventions completed:  Yes  SDOH Interventions Today    Flowsheet Row Most Recent Value  SDOH Interventions   Food Insecurity Interventions Intervention Not Indicated  Housing Interventions Intervention Not Indicated  Transportation Interventions Intervention Not Indicated        Care Coordination Interventions:  Yes, provided   Follow up plan: Follow up call scheduled for April    Encounter Outcome:  Patient Visit Completed   Thomas [name] J Huyen Perazzo, RN, MSN RN Care Manager [facility], Population Health Direct Dial: [phone]  Fax: [phone] Website: [email or web address]

## 2023-09-08 NOTE — Patient Instructions (Signed)
 Visit Information  Thank you for taking time to visit with me today. Please don't hesitate to contact me if I can be of assistance to you.   Following are the goals we discussed today:   Goals Addressed             This Visit's Progress    Hypertension Management       Care Coordination Interventions: Evaluation of current treatment plan related to hypertension self management and patient's adherence to plan as established by provider Provided education to patient re: stroke prevention, s/s of heart attack and stroke Discussed plans with patient for ongoing care management follow up and provided patient with direct contact information for care management team  Patient doing good.  He reports blood pressure continues to do well.  No numbers to report but blood pressure less than 140/80.  Reviewed  HTN management.  No concerns.             Our next appointment is by telephone on 12/02/23 at 1130 am  Please call the care guide team at [phone] if you need to cancel or reschedule your appointment.   If you are experiencing a Mental Health or Behavioral Health Crisis or need someone to talk to, please call the Suicide and Crisis Lifeline: 988   Patient verbalizes understanding of instructions and care plan provided today and agrees to view in MyChart. Active MyChart status and patient understanding of how to access instructions and care plan via MyChart confirmed with patient.     The patient has been provided with contact information for the care management team and has been advised to call with any health related questions or concerns.   Laquida Cotrell J Mancil Pfenning, RN, MSN RN Care Manager [facility], Population Health Direct Dial: [phone]  Fax: [phone] Website: [email or web address]

## 2023-09-09 ENCOUNTER — Other Ambulatory Visit: Payer: Self-pay

## 2023-09-24 ENCOUNTER — Other Ambulatory Visit: Payer: Self-pay

## 2023-09-24 ENCOUNTER — Other Ambulatory Visit: Payer: Self-pay | Admitting: Internal Medicine

## 2023-09-24 DIAGNOSIS — B20 Human immunodeficiency virus [HIV] disease: Secondary | ICD-10-CM

## 2023-09-24 MED ORDER — BIKTARVY 50-200-25 MG PO TABS
1.0000 | ORAL_TABLET | Freq: Every day | ORAL | 0 refills | Status: DC
Start: 2023-09-24 — End: 2023-10-22
  Filled 2023-09-24: qty 30, 30d supply, fill #0

## 2023-09-24 NOTE — Progress Notes (Signed)
Specialty Pharmacy Refill Coordination Note  Thomas [name] is a 78 y.o. male contacted today regarding refills of specialty medication(s) Bictegravir-Emtricitab-Tenofov Susanne Borders)   Patient requested Delivery   Delivery date: 09/30/23   Verified address: [address]   [place] [address]   Medication will be filled on 09/29/23 pending a refill request.    This fill date is pending response to refill request from provider. Patient is aware and if they have not received fill by intended date they must follow up with pharmacy

## 2023-09-29 ENCOUNTER — Other Ambulatory Visit (HOSPITAL_COMMUNITY): Payer: Self-pay

## 2023-09-29 ENCOUNTER — Other Ambulatory Visit: Payer: Self-pay

## 2023-09-30 ENCOUNTER — Other Ambulatory Visit: Payer: Self-pay

## 2023-09-30 ENCOUNTER — Telehealth: Payer: Self-pay

## 2023-09-30 ENCOUNTER — Other Ambulatory Visit (HOSPITAL_COMMUNITY): Payer: Self-pay

## 2023-09-30 NOTE — Telephone Encounter (Signed)
[facility] Patient Advocate Encounter   I was successful in securing patient a $ 2,000 grant from [employer] to provide copayment coverage for Biktarvy.  The patient's out of pocket cost will be 0.00 monthly.     I have spoken with the patient.    The billing information is as follows and has been shared with [facility].         Patient knows to call the office with questions or concerns.  Clearance Coots, CPhT Specialty Pharmacy Patient [facility] for Infectious Disease Phone: [phone] Fax:  [phone]

## 2023-10-15 ENCOUNTER — Ambulatory Visit: Payer: Medicare HMO | Admitting: Internal Medicine

## 2023-10-22 ENCOUNTER — Other Ambulatory Visit: Payer: Self-pay

## 2023-10-22 ENCOUNTER — Other Ambulatory Visit: Payer: Self-pay | Admitting: Internal Medicine

## 2023-10-22 ENCOUNTER — Ambulatory Visit: Payer: Medicare HMO | Admitting: Internal Medicine

## 2023-10-22 DIAGNOSIS — B20 Human immunodeficiency virus [HIV] disease: Secondary | ICD-10-CM

## 2023-10-22 NOTE — Progress Notes (Signed)
Specialty Pharmacy Refill Coordination Note  Thomas [name] is a 78 y.o. male contacted today regarding refills of specialty medication(s) Bictegravir-Emtricitab-Tenofov Susanne Borders)   Patient requested Delivery   Delivery date: 10/28/23   Verified address: [address]   [place] [address]   Medication will be filled on 10/27/23.

## 2023-10-23 ENCOUNTER — Other Ambulatory Visit: Payer: Self-pay

## 2023-10-23 ENCOUNTER — Other Ambulatory Visit (HOSPITAL_COMMUNITY): Payer: Self-pay

## 2023-10-23 MED ORDER — BIKTARVY 50-200-25 MG PO TABS
1.0000 | ORAL_TABLET | Freq: Every day | ORAL | 0 refills | Status: DC
Start: 2023-10-23 — End: 2023-12-09
  Filled 2023-10-23 – 2023-11-17 (×4): qty 30, 30d supply, fill #0

## 2023-11-05 ENCOUNTER — Telehealth: Payer: Self-pay

## 2023-11-05 NOTE — Patient Outreach (Signed)
 Care Coordination   Follow Up Visit Note   11/05/2023 Name: Thomas [name] MRN: [id number] DOB: 20-Oct-1945  Thomas [name] is a 78 y.o. year old male who sees Thomas Morel, DO for primary care. I spoke with  Thomas [name] by phone today.  What matters to the patients health and wellness today?  Maintain health    Goals Addressed             This Visit's Progress    COMPLETED: Hypertension Management       Care Coordination Interventions: Evaluation of current treatment plan related to hypertension self management and patient's adherence to plan as established by provider Provided education to patient re: stroke prevention, s/s of heart attack and stroke Discussed plans with patient for ongoing care management follow up and provided patient with direct contact information for care management team  Patient doing good.  He reports blood pressure continues to do well.  No numbers to report but blood pressure less than 140/80.  Reiterated  HTN management.  No concerns.  Discussed case closure as he has done well in managing health.  He is agreeable.             SDOH assessments and interventions completed:  Yes     Care Coordination Interventions:  Yes, provided   Follow up plan: No further intervention required.   Encounter Outcome:  Patient Visit Completed   Thomas Leriche RN, MSN [facility], [facility] Health RN Care Manager Direct Dial: [phone]  Fax: [phone] Website: [email or web address]

## 2023-11-05 NOTE — Patient Instructions (Signed)
 Visit Information  Thank you for taking time to visit with me today. Please don't hesitate to contact me if I can be of assistance to you.   Following are the goals we discussed today:   Goals Addressed             This Visit's Progress    COMPLETED: Hypertension Management       Care Coordination Interventions: Evaluation of current treatment plan related to hypertension self management and patient's adherence to plan as established by provider Provided education to patient re: stroke prevention, s/s of heart attack and stroke Discussed plans with patient for ongoing care management follow up and provided patient with direct contact information for care management team  Patient doing good.  He reports blood pressure continues to do well.  No numbers to report but blood pressure less than 140/80.  Reiterated  HTN management.  No concerns.  Discussed case closure as he has done well in managing health.  He is agreeable.              If you are experiencing a Mental Health or Behavioral Health Crisis or need someone to talk to, please call the Suicide and Crisis Lifeline: 988   Patient verbalizes understanding of instructions and care plan provided today and agrees to view in MyChart. Active MyChart status and patient understanding of how to access instructions and care plan via MyChart confirmed with patient.     The patient has been provided with contact information for the care management team and has been advised to call with any health related questions or concerns.   Bary Leriche RN, MSN [facility], [facility] Health RN Care Manager Direct Dial: [phone]  Fax: [phone] Website: [email or web address]

## 2023-11-11 ENCOUNTER — Ambulatory Visit: Payer: Medicare HMO | Admitting: Internal Medicine

## 2023-11-14 ENCOUNTER — Ambulatory Visit: Admitting: Internal Medicine

## 2023-11-14 ENCOUNTER — Encounter: Payer: Self-pay | Admitting: Internal Medicine

## 2023-11-14 ENCOUNTER — Other Ambulatory Visit: Payer: Self-pay

## 2023-11-14 VITALS — BP 131/77 | HR 66 | Ht 69.0 in | Wt 163.0 lb

## 2023-11-14 DIAGNOSIS — N183 Chronic kidney disease, stage 3 unspecified: Secondary | ICD-10-CM

## 2023-11-14 DIAGNOSIS — B20 Human immunodeficiency virus [HIV] disease: Secondary | ICD-10-CM

## 2023-11-14 DIAGNOSIS — N1831 Chronic kidney disease, stage 3a: Secondary | ICD-10-CM

## 2023-11-14 DIAGNOSIS — E7849 Other hyperlipidemia: Secondary | ICD-10-CM | POA: Diagnosis not present

## 2023-11-14 DIAGNOSIS — I10 Essential (primary) hypertension: Secondary | ICD-10-CM

## 2023-11-14 DIAGNOSIS — I129 Hypertensive chronic kidney disease with stage 1 through stage 4 chronic kidney disease, or unspecified chronic kidney disease: Secondary | ICD-10-CM

## 2023-11-14 NOTE — Assessment & Plan Note (Signed)
His blood pressure is stable.

## 2023-11-14 NOTE — Assessment & Plan Note (Signed)
 Check his creatinine today.  Has been stable.

## 2023-11-14 NOTE — Progress Notes (Signed)
   Subjective:    Patient ID: Thomas [name], male    DOB: 1946-03-21, 78 y.o.   MRN: [id number]  HPI Thomas [name] is here for follow-up of HIV. He continues on [place] and denies any missed doses.  He has no complaints today.  He is continuing to do well at his new house with 5 acres of land and has access to 80 acres around him.  No issues with getting or taking his medication.  He asked about potential sexual activity if he needs someone new.   Review of Systems  Constitutional:  Negative for fatigue.  Gastrointestinal:  Negative for diarrhea and nausea.  Skin:  Negative for rash.       Objective:   Physical Exam Eyes:     General: No scleral icterus. Pulmonary:     Effort: Pulmonary effort is normal.  Neurological:     Mental Status: He is alert.   SH: no tobacco        Assessment & Plan:

## 2023-11-14 NOTE — Assessment & Plan Note (Addendum)
 He continues to do well and will check his labs today.  No new concerns and he can follow-up in 6 months.  I discussed U equals U in regards to sexual activity and his undetectable status.

## 2023-11-16 LAB — T-HELPER CELLS (CD4) COUNT (NOT AT ARMC)
Absolute CD4: 285 {cells}/uL — ABNORMAL LOW (ref 490–1740)
CD4 T Helper %: 17 % — ABNORMAL LOW (ref 30–61)
Total lymphocyte count: 1658 {cells}/uL (ref 850–3900)

## 2023-11-16 LAB — CBC WITH DIFFERENTIAL/PLATELET
Absolute Lymphocytes: 1540 {cells}/uL (ref 850–3900)
Absolute Monocytes: 260 {cells}/uL (ref 200–950)
Basophils Absolute: 9 {cells}/uL (ref 0–200)
Basophils Relative: 0.2 %
Eosinophils Absolute: 101 {cells}/uL (ref 15–500)
Eosinophils Relative: 2.3 %
HCT: 45.6 % (ref 38.5–50.0)
Hemoglobin: 14.9 g/dL (ref 13.2–17.1)
MCH: 28.8 pg (ref 27.0–33.0)
MCHC: 32.7 g/dL (ref 32.0–36.0)
MCV: 88.2 fL (ref 80.0–100.0)
MPV: 10.6 fL (ref 7.5–12.5)
Monocytes Relative: 5.9 %
Neutro Abs: 2490 {cells}/uL (ref 1500–7800)
Neutrophils Relative %: 56.6 %
Platelets: 137 10*3/uL — ABNORMAL LOW (ref 140–400)
RBC: 5.17 10*6/uL (ref 4.20–5.80)
RDW: 12.4 % (ref 11.0–15.0)
Total Lymphocyte: 35 %
WBC: 4.4 10*3/uL (ref 3.8–10.8)

## 2023-11-16 LAB — COMPLETE METABOLIC PANEL WITH GFR
AG Ratio: 1.3 (calc) (ref 1.0–2.5)
ALT: 12 U/L (ref 9–46)
AST: 17 U/L (ref 10–35)
Albumin: 4.1 g/dL (ref 3.6–5.1)
Alkaline phosphatase (APISO): 110 U/L (ref 35–144)
BUN/Creatinine Ratio: 12 (calc) (ref 6–22)
BUN: 20 mg/dL (ref 7–25)
CO2: 33 mmol/L — ABNORMAL HIGH (ref 20–32)
Calcium: 9.4 mg/dL (ref 8.6–10.3)
Chloride: 102 mmol/L (ref 98–110)
Creat: 1.64 mg/dL — ABNORMAL HIGH (ref 0.70–1.28)
Globulin: 3.2 g/dL (ref 1.9–3.7)
Glucose, Bld: 117 mg/dL — ABNORMAL HIGH (ref 65–99)
Potassium: 3.4 mmol/L — ABNORMAL LOW (ref 3.5–5.3)
Sodium: 141 mmol/L (ref 135–146)
Total Bilirubin: 0.5 mg/dL (ref 0.2–1.2)
Total Protein: 7.3 g/dL (ref 6.1–8.1)
eGFR: 43 mL/min/{1.73_m2} — ABNORMAL LOW (ref >=60)

## 2023-11-16 LAB — HIV-1 RNA QUANT-NO REFLEX-BLD
HIV 1 RNA Quant: NOT DETECTED {copies}/mL
HIV-1 RNA Quant, Log: NOT DETECTED {Log_copies}/mL

## 2023-11-16 LAB — LIPID PANEL
Cholesterol: 103 mg/dL (ref <200)
HDL: 42 mg/dL (ref >=40)
LDL Cholesterol (Calc): 48 mg/dL
Non-HDL Cholesterol (Calc): 61 mg/dL (ref <130)
Total CHOL/HDL Ratio: 2.5 (calc) (ref <5.0)
Triglycerides: 45 mg/dL (ref <150)

## 2023-11-17 ENCOUNTER — Other Ambulatory Visit: Payer: Self-pay

## 2023-11-17 ENCOUNTER — Other Ambulatory Visit (HOSPITAL_COMMUNITY): Payer: Self-pay

## 2023-11-17 NOTE — Progress Notes (Signed)
 Specialty Pharmacy Refill Coordination Note  Thomas [name] is a 78 y.o. male contacted today regarding refills of specialty medication(s) Bictegravir-Emtricitab-Tenofov Susanne Borders)   Patient requested Delivery   Delivery date: 11/19/23   Verified address: [address]   [place] [address]   Medication will be filled on 11/18/23.

## 2023-11-18 ENCOUNTER — Other Ambulatory Visit: Payer: Self-pay

## 2023-12-09 ENCOUNTER — Other Ambulatory Visit: Payer: Self-pay

## 2023-12-09 ENCOUNTER — Other Ambulatory Visit: Payer: Self-pay | Admitting: Internal Medicine

## 2023-12-09 DIAGNOSIS — B20 Human immunodeficiency virus [HIV] disease: Secondary | ICD-10-CM

## 2023-12-09 MED ORDER — BIKTARVY 50-200-25 MG PO TABS
1.0000 | ORAL_TABLET | Freq: Every day | ORAL | 5 refills | Status: DC
  Filled 2023-12-09: qty 30, 30d supply, fill #0
  Filled 2024-01-12: qty 30, 30d supply, fill #1
  Filled 2024-02-03: qty 30, 30d supply, fill #2
  Filled 2024-03-02: qty 30, 30d supply, fill #3
  Filled 2024-03-30: qty 30, 30d supply, fill #4
  Filled 2024-04-19: qty 30, 30d supply, fill #5

## 2023-12-09 NOTE — Progress Notes (Signed)
 Specialty Pharmacy Refill Coordination Note  Thomas [name] is a 78 y.o. male contacted today regarding refills of specialty medication(s) Bictegravir-Emtricitab-Tenofov [facility])   Patient requested (Patient-Rptd) Delivery   Delivery date: 12/16/23   Verified address: (Patient-Rptd) [address] [place] [address]   Medication will be filled on 12/15/23.   This fill date is pending response to refill request from provider. Patient is aware and if they have not received fill by intended date they must follow up with pharmacy.

## 2023-12-15 ENCOUNTER — Other Ambulatory Visit: Payer: Self-pay

## 2024-01-12 ENCOUNTER — Other Ambulatory Visit: Payer: Self-pay

## 2024-01-12 NOTE — Progress Notes (Signed)
 Specialty Pharmacy Refill Coordination Note  Thomas [name] is a 78 y.o. male contacted today regarding refills of specialty medication(s) Bictegravir-Emtricitab-Tenofov (Biktarvy )   Patient requested Delivery   Delivery date: 01/14/24   Verified address: [address] [place] [address]   Medication will be filled on 01/13/24.

## 2024-02-03 ENCOUNTER — Other Ambulatory Visit: Payer: Self-pay

## 2024-02-03 NOTE — Progress Notes (Signed)
 Specialty Pharmacy Refill Coordination Note  Thomas [name] is a 78 y.o. male contacted today regarding refills of specialty medication(s) Bictegravir-Emtricitab-Tenofov (Biktarvy )   Patient requested Delivery   Delivery date: 02/09/24   Verified address: [address] [place] [address]   Medication will be filled on 02/06/24.

## 2024-02-03 NOTE — Progress Notes (Signed)
 Specialty Pharmacy Ongoing Clinical Assessment Note  Thomas [name] is a 78 y.o. male who is being followed by the specialty pharmacy service for RxSp HIV   Patient's specialty medication(s) reviewed today: Bictegravir-Emtricitab-Tenofov (Biktarvy )   Missed doses in the last 4 weeks: 0   Patient/Caregiver did not have any additional questions or concerns.   Therapeutic benefit summary: Patient is achieving benefit   Adverse events/side effects summary: No adverse events/side effects   Patient's therapy is appropriate to: Continue    Goals Addressed             This Visit's Progress    Achieve Undetectable HIV Viral Load < 20   On track    Patient is on track. Patient will maintain adherence.  Patient's viral load remains undetectable as of 11/14/23.          Follow up: 6 months  Uchenna Rappaport M Jazmine Heckman Specialty Pharmacist

## 2024-02-06 ENCOUNTER — Other Ambulatory Visit: Payer: Self-pay

## 2024-02-21 ENCOUNTER — Other Ambulatory Visit: Payer: Self-pay | Admitting: Student

## 2024-02-21 DIAGNOSIS — E7849 Other hyperlipidemia: Secondary | ICD-10-CM

## 2024-02-21 DIAGNOSIS — I10 Essential (primary) hypertension: Secondary | ICD-10-CM

## 2024-02-23 NOTE — Telephone Encounter (Signed)
 Medication sent to pharmacy

## 2024-03-02 ENCOUNTER — Other Ambulatory Visit: Payer: Self-pay

## 2024-03-02 ENCOUNTER — Other Ambulatory Visit (HOSPITAL_COMMUNITY): Payer: Self-pay

## 2024-03-02 NOTE — Progress Notes (Signed)
 Specialty Pharmacy Refill Coordination Note  Spoke with Thomas [name] (Self)   Thomas [name] is a 78 y.o. male contacted today regarding refills of specialty medication(s) Bictegravir-Emtricitab-Tenofov (Biktarvy )  Patient requested: Delivery   Delivery date: 03/04/24   Verified address: [address]  [place] [address]  Medication will be filled on 03/03/24.

## 2024-03-30 ENCOUNTER — Encounter (INDEPENDENT_AMBULATORY_CARE_PROVIDER_SITE_OTHER): Payer: Self-pay

## 2024-03-30 ENCOUNTER — Other Ambulatory Visit: Payer: Self-pay

## 2024-03-30 NOTE — Progress Notes (Signed)
 Specialty Pharmacy Refill Coordination Note  Thomas [name] is a 78 y.o. male contacted today regarding refills of specialty medication(s) Bictegravir-Emtricitab-Tenofov (Biktarvy )   Patient requested (Patient-Rptd) Delivery   Delivery date: 03/31/24   Verified address: (Patient-Rptd) [address] [place] [address]   Medication will be filled on 03/30/24.

## 2024-04-13 DIAGNOSIS — H5213 Myopia, bilateral: Secondary | ICD-10-CM | POA: Diagnosis not present

## 2024-04-19 ENCOUNTER — Other Ambulatory Visit: Payer: Self-pay

## 2024-04-19 NOTE — Progress Notes (Signed)
 Specialty Pharmacy Refill Coordination Note  Thomas [name] is a 78 y.o. male contacted today regarding refills of specialty medication(s) Bictegravir-Emtricitab-Tenofov (Biktarvy )   Patient requested (Patient-Rptd) Delivery   Delivery date: 04/23/24   Verified address: (Patient-Rptd) [address] [place] [address]   Medication will be filled on 04/22/24.

## 2024-04-23 ENCOUNTER — Encounter: Payer: Self-pay | Admitting: Pharmacist

## 2024-05-13 ENCOUNTER — Other Ambulatory Visit (HOSPITAL_COMMUNITY): Payer: Self-pay

## 2024-05-13 ENCOUNTER — Encounter (INDEPENDENT_AMBULATORY_CARE_PROVIDER_SITE_OTHER): Payer: Self-pay

## 2024-05-13 ENCOUNTER — Other Ambulatory Visit: Payer: Self-pay

## 2024-05-13 ENCOUNTER — Other Ambulatory Visit: Payer: Self-pay | Admitting: Internal Medicine

## 2024-05-13 DIAGNOSIS — B20 Human immunodeficiency virus [HIV] disease: Secondary | ICD-10-CM

## 2024-05-13 NOTE — Progress Notes (Signed)
 Specialty Pharmacy Refill Coordination Note  MyChart Questionnaire Submission  Thomas [name] is a 78 y.o. male contacted today regarding refills of specialty medication(s) Biktarvy .  Doses on hand: (Patient-Rptd) 6   Patient requested: (Patient-Rptd) Delivery   Delivery date: 05/18/24  Verified address: [address] [place] [address]  Medication will be filled on 05/17/24.    This fill date is pending response to refill request from provider. Patient is aware and if they have not received fill by intended date, they must follow up with pharmacy.

## 2024-05-14 ENCOUNTER — Other Ambulatory Visit: Payer: Self-pay

## 2024-05-14 MED ORDER — BIKTARVY 50-200-25 MG PO TABS
1.0000 | ORAL_TABLET | Freq: Every day | ORAL | 0 refills | Status: DC
Start: 2024-05-14 — End: 2024-05-19
  Filled 2024-05-14: qty 30, 30d supply, fill #0

## 2024-05-17 ENCOUNTER — Other Ambulatory Visit: Payer: Self-pay

## 2024-05-18 ENCOUNTER — Other Ambulatory Visit (HOSPITAL_COMMUNITY): Payer: Self-pay

## 2024-05-18 NOTE — Progress Notes (Unsigned)
 Subjective:  Chief complaint: follow-up for HIV disease on medications   Patient ID: Thomas [name], male    DOB: 03-11-1946, 78 y.o.   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  Recommend 1 year follow up with CT or MRI.    Enlarged thyroid  04/22/2015   Seen on CTA 04/21/15: Contains multiple nodules measuring up to 2.1 cm in size, TSH wnl.  Needs thyroid  ultrasound and biopsy   Heart murmur    HIV (human immunodeficiency virus infection) (HCC)    HTN (hypertension)    Immune deficiency disorder (HCC)    Stroke [facility])     Past Surgical History:  Procedure Laterality Date   NO PAST SURGERIES      Family History  Problem Relation Age of Onset   Hypertension Mother    Hyperlipidemia Mother    Hypertension Sister    Hypertension Sister    Heart attack Brother       Social History   Socioeconomic History   Marital status: Single    Spouse name: Not on file   Number of children: Not on file   Years of education: Not on file   Highest education level: Not on file  Occupational History   Occupation: Retired    Comment: Print shop  Tobacco Use   Smoking status: Never   Smokeless tobacco: Never  Vaping Use   Vaping status: Never Used  Substance and Sexual Activity   Alcohol use: No    Alcohol/week: 0.0 standard drinks of alcohol   Drug use: No   Sexual activity: Not Currently    Comment: declined condoms  Other Topics Concern   Not on file  Social History Narrative   Current Social History 11/14/2020      Patient lives alone in a home which is 2 stories. There are no steps up to the entrance the patient uses.       Patient's method of transportation is personal car.      The highest level of education was  high school diploma.      The patient currently retired from print shop.      Identified important Relationships are My sister, Arlean.       Pets : None       Interests / Fun: Walking, and shooting rifle at the rifle range.       Current Stressors: I don't have any right now.      Religious / Personal Beliefs: [employer]       L. [place], BSN, RN-BC          Social Drivers of Health   Financial Resource Strain: Low Risk  (12/25/2021)   Overall Financial Resource Strain (CARDIA)    Difficulty of Paying Living Expenses: Not hard at all  Food Insecurity: No Food Insecurity (09/08/2023)   Hunger Vital Sign    Worried About Running Out of Food in the Last Year: Never true    Ran Out of Food in the Last Year: Never true  Transportation Needs: No Transportation Needs (09/08/2023)   PRAPARE - Administrator, Civil Service (Medical): No    Lack of Transportation (Non-Medical): No  Physical Activity: Sufficiently Active (12/25/2021)   Exercise  Vital Sign    Days of Exercise per Week: 5 days    Minutes of Exercise per Session: 60 min  Stress: No Stress Concern Present (12/25/2021)   [employer] of Occupational Health - Occupational Stress Questionnaire    Feeling of Stress : Only a little  Social Connections: Socially Isolated (09/10/2022)   Social Connection and Isolation Panel    Frequency of Communication with Friends and Family: Twice a week    Frequency of Social Gatherings with Friends and Family: Never    Attends Religious Services: 1 to 4 times per year    Active Member of [employer] or Organizations: No    Attends Engineer, structural: Never    Marital Status: Divorced    No Known Allergies   Current Outpatient Medications:    amLODipine  (NORVASC ) 5 MG tablet, Take 1 tablet by mouth once daily, Disp: 90 tablet, Rfl: 0   aspirin  EC 325 MG tablet, Take 1 tablet (325 mg total) by mouth daily., Disp: 30 tablet, Rfl: 0   atorvastatin  (LIPITOR)  40 MG tablet, TAKE 1 TABLET BY MOUTH ONCE DAILY AT  6PM, Disp: 90 tablet, Rfl: 0   bictegravir-emtricitabine -tenofovir  AF (BIKTARVY ) 50-200-25 MG TABS tablet, TAKE 1 TABLET BY MOUTH DAILY., Disp: 30 tablet, Rfl: 0   lisinopril -hydrochlorothiazide  (ZESTORETIC ) 20-12.5 MG tablet, Take 2 tablets by mouth once daily, Disp: 180 tablet, Rfl: 0   Review of Systems     Objective:   Physical Exam        Assessment & Plan:

## 2024-05-19 ENCOUNTER — Ambulatory Visit: Admitting: Internal Medicine

## 2024-05-19 ENCOUNTER — Ambulatory Visit: Admitting: Infectious Disease

## 2024-05-19 ENCOUNTER — Other Ambulatory Visit: Payer: Self-pay

## 2024-05-19 ENCOUNTER — Encounter: Payer: Self-pay | Admitting: Infectious Disease

## 2024-05-19 ENCOUNTER — Other Ambulatory Visit: Payer: Self-pay | Admitting: Student

## 2024-05-19 VITALS — BP 125/73 | HR 91 | Temp 98.6°F | Ht 69.0 in | Wt 158.0 lb

## 2024-05-19 DIAGNOSIS — Z113 Encounter for screening for infections with a predominantly sexual mode of transmission: Secondary | ICD-10-CM

## 2024-05-19 DIAGNOSIS — I10 Essential (primary) hypertension: Secondary | ICD-10-CM

## 2024-05-19 DIAGNOSIS — E7849 Other hyperlipidemia: Secondary | ICD-10-CM | POA: Diagnosis not present

## 2024-05-19 DIAGNOSIS — Z7185 Encounter for immunization safety counseling: Secondary | ICD-10-CM

## 2024-05-19 DIAGNOSIS — B20 Human immunodeficiency virus [HIV] disease: Secondary | ICD-10-CM | POA: Diagnosis not present

## 2024-05-19 DIAGNOSIS — N1831 Chronic kidney disease, stage 3a: Secondary | ICD-10-CM

## 2024-05-19 MED ORDER — BIKTARVY 50-200-25 MG PO TABS
1.0000 | ORAL_TABLET | Freq: Every day | ORAL | 0 refills | Status: DC
  Filled 2024-05-19 – 2024-06-22 (×2): qty 30, 30d supply, fill #0

## 2024-05-20 ENCOUNTER — Other Ambulatory Visit: Payer: Self-pay | Admitting: Student

## 2024-05-20 DIAGNOSIS — I10 Essential (primary) hypertension: Secondary | ICD-10-CM

## 2024-05-20 NOTE — Telephone Encounter (Signed)
 Patient last seen 12/18/2023 I called the patient to schedule a appointment. Unable to reach the patient, I lvm for him to give us  a call back.

## 2024-05-20 NOTE — Telephone Encounter (Signed)
 Medication sent to pharmacy

## 2024-05-21 LAB — LIPID PANEL
Cholesterol: 80 mg/dL (ref <200)
HDL: 37 mg/dL — ABNORMAL LOW (ref >=40)
LDL Cholesterol (Calc): 29 mg/dL
Non-HDL Cholesterol (Calc): 43 mg/dL (ref <130)
Total CHOL/HDL Ratio: 2.2 (calc) (ref <5.0)
Triglycerides: 59 mg/dL (ref <150)

## 2024-05-21 LAB — COMPLETE METABOLIC PANEL WITHOUT GFR
AG Ratio: 1.4 (calc) (ref 1.0–2.5)
ALT: 11 U/L (ref 9–46)
AST: 16 U/L (ref 10–35)
Albumin: 3.8 g/dL (ref 3.6–5.1)
Alkaline phosphatase (APISO): 88 U/L (ref 35–144)
BUN/Creatinine Ratio: 14 (calc) (ref 6–22)
BUN: 24 mg/dL (ref 7–25)
CO2: 32 mmol/L (ref 20–32)
Calcium: 8.9 mg/dL (ref 8.6–10.3)
Chloride: 104 mmol/L (ref 98–110)
Creat: 1.66 mg/dL — ABNORMAL HIGH (ref 0.70–1.28)
Globulin: 2.8 g/dL (ref 1.9–3.7)
Glucose, Bld: 134 mg/dL — ABNORMAL HIGH (ref 65–99)
Potassium: 3.9 mmol/L (ref 3.5–5.3)
Sodium: 141 mmol/L (ref 135–146)
Total Bilirubin: 0.4 mg/dL (ref 0.2–1.2)
Total Protein: 6.6 g/dL (ref 6.1–8.1)

## 2024-05-21 LAB — CBC WITH DIFFERENTIAL/PLATELET
Absolute Lymphocytes: 1751 {cells}/uL (ref 850–3900)
Absolute Monocytes: 251 {cells}/uL (ref 200–950)
Basophils Absolute: 9 {cells}/uL (ref 0–200)
Basophils Relative: 0.2 %
Eosinophils Absolute: 40 {cells}/uL (ref 15–500)
Eosinophils Relative: 0.9 %
HCT: 41.7 % (ref 38.5–50.0)
Hemoglobin: 13.8 g/dL (ref 13.2–17.1)
MCH: 29.9 pg (ref 27.0–33.0)
MCHC: 33.1 g/dL (ref 32.0–36.0)
MCV: 90.3 fL (ref 80.0–100.0)
MPV: 11.1 fL (ref 7.5–12.5)
Monocytes Relative: 5.7 %
Neutro Abs: 2350 {cells}/uL (ref 1500–7800)
Neutrophils Relative %: 53.4 %
Platelets: 120 Thousand/uL — ABNORMAL LOW (ref 140–400)
RBC: 4.62 Million/uL (ref 4.20–5.80)
RDW: 12.5 % (ref 11.0–15.0)
Total Lymphocyte: 39.8 %
WBC: 4.4 Thousand/uL (ref 3.8–10.8)

## 2024-05-21 LAB — HIV-1 RNA QUANT-NO REFLEX-BLD
HIV 1 RNA Quant: NOT DETECTED {copies}/mL
HIV-1 RNA Quant, Log: NOT DETECTED {Log_copies}/mL

## 2024-05-21 LAB — T-HELPER CELLS (CD4) COUNT (NOT AT ARMC)
Absolute CD4: 348 {cells}/uL — ABNORMAL LOW (ref 490–1740)
CD4 T Helper %: 18 % — ABNORMAL LOW (ref 30–61)
Total lymphocyte count: 1890 {cells}/uL (ref 850–3900)

## 2024-05-21 LAB — RPR: RPR Ser Ql: NONREACTIVE

## 2024-06-08 ENCOUNTER — Other Ambulatory Visit: Payer: Self-pay

## 2024-06-09 ENCOUNTER — Ambulatory Visit (INDEPENDENT_AMBULATORY_CARE_PROVIDER_SITE_OTHER): Payer: Self-pay | Admitting: Student

## 2024-06-09 VITALS — BP 127/74 | HR 71 | Temp 98.8°F | Ht 69.0 in | Wt 161.4 lb

## 2024-06-09 DIAGNOSIS — D696 Thrombocytopenia, unspecified: Secondary | ICD-10-CM | POA: Diagnosis not present

## 2024-06-09 DIAGNOSIS — B20 Human immunodeficiency virus [HIV] disease: Secondary | ICD-10-CM | POA: Diagnosis not present

## 2024-06-09 DIAGNOSIS — I10 Essential (primary) hypertension: Secondary | ICD-10-CM

## 2024-06-09 DIAGNOSIS — Z8673 Personal history of transient ischemic attack (TIA), and cerebral infarction without residual deficits: Secondary | ICD-10-CM

## 2024-06-09 NOTE — Patient Instructions (Signed)
 Thank you, [name], for allowing us  to provide your care today.  You are doing a great job with your health and we are very glad to see you back in the clinic.  Continue to take your medications as you have been and I will check with your neurologist about decreasing the dose of the aspirin .  I will call you if they agree that we can decrease the dose.  Otherwise we will plan to see you back in 6 months but please let us  know if you need anything before then.   Follow up: 6 months    Remember:  Should you have any questions or concerns please call the internal medicine clinic at [phone].     Fairy Pool, DO [facility]

## 2024-06-09 NOTE — Progress Notes (Signed)
   CC: Overdue follow Up for management of chronic medical conditions after last office visit 12/18/2022  HPI:  Thomas [name] is a 78 y.o. male with pertinent PMH of hypertension, CKD stage IIIb, HIV, prior CVA, and thrombocytopenia who presents as above. Please see assessment and plan below for further details.  Medications: Current Outpatient Medications  Medication Instructions   amLODipine  (NORVASC ) 5 mg, Oral, Daily   aspirin  EC 325 mg, Oral, Daily   atorvastatin  (LIPITOR) 40 MG tablet TAKE 1 TABLET BY MOUTH ONCE DAILY AT  6  PM   bictegravir-emtricitabine -tenofovir  AF (BIKTARVY ) 50-200-25 MG TABS tablet TAKE 1 TABLET BY MOUTH DAILY.   lisinopril -hydrochlorothiazide  (ZESTORETIC ) 20-12.5 MG tablet 2 tablets, Oral, Daily     Review of Systems:   Pertinent items noted in HPI and/or A&P.  Physical Exam:  Vitals:   06/09/24 1032  BP: 127/74  Pulse: 71  Temp: 98.8 F (37.1 C)  TempSrc: Oral  SpO2: 94%  Weight: 161 lb 6.4 oz (73.2 kg)  Height: 5' 9 (1.753 m)    Constitutional: Well-appearing adult male. In no acute distress. HEENT: Normocephalic, atraumatic, Sclera non-icteric, PERRL, EOM intact Cardio:Regular rate and rhythm. 2+ bilateral radial pulses. Pulm:Clear to auscultation bilaterally. Normal work of breathing on room air. FDX:Wzhjupcz for extremity edema. Skin:Warm and dry. Neuro:Alert and oriented x3. No focal deficit noted. Psych:Pleasant mood and affect.   Assessment & Plan:   Assessment & Plan Essential hypertension Blood pressure well-controlled at 127/74.  He continues on amlodipine , hydrochlorothiazide , and lisinopril .  CMP on 05/19/2024 showed stable renal function and electrolytes.  Denies any significant orthostatic symptoms. - Continue amlodipine  5 mg daily, lisinopril /hydrochlorothiazide  40/25 mg daily HIV disease (HCC) Continues to follow with infectious disease with his last visit on 05/19/2024 with CD4 count of 348 and undetectable HIV RNA. -  Continue Biktarvy  and follow-up with ID Thrombocytopenia Patient with a long history of thrombocytopenia that was stable on recent CBC on 05/19/2024.  Platelet level 120 then.  No available peripheral smears available.  No signs of liver pathology on chart review. - Continue to monitor with CBC every 6-12 months History of CVA (cerebrovascular accident) Patient with a history of left basal ganglia/corona radiata ischemic infarct in 2016 without significant residual deficits and an episode of recrudescence in 2017.  Last follow-up with neurology was on 05/09/2016.  He is on appropriate antiplatelet and statin therapy with good control of LDL.  However he is on aspirin  325 mg daily but would probably be able to decrease to 81 mg daily to reduce the risk of bleeding.  Lipid panel on 05/19/2024 showed LDL continues to be well-controlled at 29. - Continue atorvastatin  40 mg daily - Message neurology about decreasing aspirin  from 3 2025 mg to 81 mg  No orders of the defined types were placed in this encounter.    Return in about 6 months (around 12/08/2024) for Routine follow-up for hypertension and hyperlipidemia.   Patient discussed with Dr. Ronnald Sergeant  Fairy Pool, DO [facility] Internal [facility] Resident PGY-3 Clinic Phone: [phone] Please contact the on call pager at [phone] for any urgent or emergent needs.

## 2024-06-12 DIAGNOSIS — D696 Thrombocytopenia, unspecified: Secondary | ICD-10-CM | POA: Insufficient documentation

## 2024-06-12 NOTE — Assessment & Plan Note (Signed)
 Patient with a long history of thrombocytopenia that was stable on recent CBC on 05/19/2024.  Platelet level 120 then.  No available peripheral smears available.  No signs of liver pathology on chart review. - Continue to monitor with CBC every 6-12 months

## 2024-06-12 NOTE — Assessment & Plan Note (Signed)
 Continues to follow with infectious disease with his last visit on 05/19/2024 with CD4 count of 348 and undetectable HIV RNA. - Continue Biktarvy  and follow-up with ID

## 2024-06-12 NOTE — Assessment & Plan Note (Signed)
 Blood pressure well-controlled at 127/74.  He continues on amlodipine , hydrochlorothiazide , and lisinopril .  CMP on 05/19/2024 showed stable renal function and electrolytes.  Denies any significant orthostatic symptoms. - Continue amlodipine  5 mg daily, lisinopril /hydrochlorothiazide  40/25 mg daily

## 2024-06-12 NOTE — Assessment & Plan Note (Signed)
 Patient with a history of left basal ganglia/corona radiata ischemic infarct in 2016 without significant residual deficits and an episode of recrudescence in 2017.  Last follow-up with neurology was on 05/09/2016.  He is on appropriate antiplatelet and statin therapy with good control of LDL.  However he is on aspirin  325 mg daily but would probably be able to decrease to 81 mg daily to reduce the risk of bleeding.  Lipid panel on 05/19/2024 showed LDL continues to be well-controlled at 29. - Continue atorvastatin  40 mg daily - Message neurology about decreasing aspirin  from 3 2025 mg to 81 mg

## 2024-06-15 ENCOUNTER — Other Ambulatory Visit: Payer: Self-pay | Admitting: Neurology

## 2024-06-15 DIAGNOSIS — Z8673 Personal history of transient ischemic attack (TIA), and cerebral infarction without residual deficits: Secondary | ICD-10-CM

## 2024-06-22 ENCOUNTER — Other Ambulatory Visit: Payer: Self-pay

## 2024-06-22 NOTE — Progress Notes (Signed)
 Specialty Pharmacy Refill Coordination Note  Thomas [name] is a 78 y.o. male contacted today regarding refills of specialty medication(s) Bictegravir-Emtricitab-Tenofov (Biktarvy )   Patient requested Delivery   Delivery date: 06/24/24   Verified address: [address] [address]   Medication will be filled on 06/23/24.

## 2024-06-23 NOTE — Progress Notes (Signed)
 [facility] Medicine Clinic Attending  Case discussed with the resident at the time of the visit.  We reviewed the resident's history and exam and pertinent patient test results.  I agree with the assessment, diagnosis, and plan of care documented in the resident's note.

## 2024-07-09 ENCOUNTER — Encounter: Payer: Self-pay | Admitting: Student

## 2024-07-09 DIAGNOSIS — Z8673 Personal history of transient ischemic attack (TIA), and cerebral infarction without residual deficits: Secondary | ICD-10-CM

## 2024-07-09 MED ORDER — ASPIRIN 81 MG PO TBEC: 81.0000 mg | DELAYED_RELEASE_TABLET | Freq: Every day | ORAL | 2 refills | Status: AC

## 2024-07-15 ENCOUNTER — Other Ambulatory Visit: Payer: Self-pay

## 2024-07-15 ENCOUNTER — Other Ambulatory Visit: Payer: Self-pay | Admitting: Infectious Disease

## 2024-07-15 DIAGNOSIS — B20 Human immunodeficiency virus [HIV] disease: Secondary | ICD-10-CM

## 2024-07-16 ENCOUNTER — Other Ambulatory Visit: Payer: Self-pay

## 2024-07-16 MED ORDER — BIKTARVY 50-200-25 MG PO TABS
1.0000 | ORAL_TABLET | Freq: Every day | ORAL | 5 refills | Status: AC
  Filled 2024-07-16: qty 30, 30d supply, fill #0
  Filled 2024-08-12: qty 30, 30d supply, fill #1
  Filled 2024-09-01 – 2024-09-23 (×4): qty 30, 30d supply, fill #2

## 2024-07-20 ENCOUNTER — Other Ambulatory Visit: Payer: Self-pay | Admitting: Pharmacy Technician

## 2024-07-20 ENCOUNTER — Other Ambulatory Visit: Payer: Self-pay

## 2024-07-20 NOTE — Progress Notes (Signed)
 Specialty Pharmacy Refill Coordination Note  Thomas [name] is a 78 y.o. male contacted today regarding refills of specialty medication(s) Bictegravir-Emtricitab-Tenofov (Biktarvy )   Patient requested Delivery   Delivery date: 07/22/24   Verified address: [address]  [place]    Medication will be filled on: 07/21/24

## 2024-07-23 ENCOUNTER — Other Ambulatory Visit: Payer: Self-pay

## 2024-08-12 ENCOUNTER — Other Ambulatory Visit: Payer: Self-pay

## 2024-08-12 ENCOUNTER — Other Ambulatory Visit (HOSPITAL_COMMUNITY): Payer: Self-pay

## 2024-08-12 NOTE — Progress Notes (Signed)
 Specialty Pharmacy Refill Coordination Note  MyChart Questionnaire Submission  Thomas [name] is a 78 y.o. male contacted today regarding refills of specialty medication(s) Biktarvy .  Doses on hand: Patient reports 6. He should have around 10-12.  Patient requested: (Patient-Rptd) Delivery   Delivery date: 08/16/24  Verified address: [address] [place] [address] [employer]  Medication will be filled on 08/13/24

## 2024-08-13 ENCOUNTER — Other Ambulatory Visit: Payer: Self-pay

## 2024-08-15 ENCOUNTER — Other Ambulatory Visit: Payer: Self-pay | Admitting: Student

## 2024-08-15 DIAGNOSIS — E7849 Other hyperlipidemia: Secondary | ICD-10-CM

## 2024-08-16 ENCOUNTER — Other Ambulatory Visit: Payer: Self-pay | Admitting: Student

## 2024-08-16 DIAGNOSIS — I10 Essential (primary) hypertension: Secondary | ICD-10-CM

## 2024-08-16 NOTE — Telephone Encounter (Signed)
 Medication sent to pharmacy

## 2024-09-01 ENCOUNTER — Other Ambulatory Visit: Payer: Self-pay

## 2024-09-07 ENCOUNTER — Other Ambulatory Visit: Payer: Self-pay

## 2024-09-08 ENCOUNTER — Other Ambulatory Visit (HOSPITAL_COMMUNITY): Payer: Self-pay

## 2024-09-08 ENCOUNTER — Other Ambulatory Visit: Payer: Self-pay

## 2024-09-15 ENCOUNTER — Other Ambulatory Visit: Payer: Self-pay

## 2024-09-22 ENCOUNTER — Other Ambulatory Visit (HOSPITAL_COMMUNITY): Payer: Self-pay

## 2024-09-23 ENCOUNTER — Other Ambulatory Visit: Payer: Self-pay

## 2024-09-27 ENCOUNTER — Other Ambulatory Visit: Payer: Self-pay

## 2024-09-27 NOTE — Progress Notes (Signed)
 Specialty Pharmacy Refill Coordination Note  Thomas [name] is a 79 y.o. male contacted today regarding refills of specialty medication(s) Bictegravir-Emtricitab-Tenofov (Biktarvy )   Patient requested Delivery   Delivery date: 10/01/24   Verified address: [address] [place] [address]   Medication will be filled on: 09/30/24

## 2024-09-30 ENCOUNTER — Other Ambulatory Visit: Payer: Self-pay

## 2024-11-01 ENCOUNTER — Ambulatory Visit: Admitting: Infectious Disease
